# Patient Record
Sex: Female | Born: 1970 | Race: Black or African American | Hispanic: No | Marital: Single | Smoking: Current every day smoker
Health system: Southern US, Community
[De-identification: ages and names within clinical notes are randomized; demographics above are authoritative.]

## PROBLEM LIST (undated history)

## (undated) ENCOUNTER — Ambulatory Visit (HOSPITAL_COMMUNITY): Payer: Medicare Other

## (undated) DIAGNOSIS — D649 Anemia, unspecified: Secondary | ICD-10-CM

## (undated) DIAGNOSIS — M1990 Unspecified osteoarthritis, unspecified site: Secondary | ICD-10-CM

## (undated) HISTORY — PX: ABDOMINAL SURGERY: SHX537

## (undated) HISTORY — PX: LAPAROSCOPIC GASTRIC SLEEVE RESECTION: SHX5895

## (undated) HISTORY — PX: CHOLECYSTECTOMY: SHX55

---

## 2000-02-02 ENCOUNTER — Emergency Department (HOSPITAL_COMMUNITY): Admission: EM | Admit: 2000-02-02 | Discharge: 2000-02-02 | Payer: Self-pay | Admitting: Emergency Medicine

## 2000-02-22 ENCOUNTER — Emergency Department (HOSPITAL_COMMUNITY): Admission: EM | Admit: 2000-02-22 | Discharge: 2000-02-22 | Payer: Self-pay | Admitting: Emergency Medicine

## 2000-02-22 ENCOUNTER — Encounter: Payer: Self-pay | Admitting: Emergency Medicine

## 2000-07-24 ENCOUNTER — Emergency Department (HOSPITAL_COMMUNITY): Admission: EM | Admit: 2000-07-24 | Discharge: 2000-07-24 | Payer: Self-pay | Admitting: Emergency Medicine

## 2000-07-24 ENCOUNTER — Encounter: Payer: Self-pay | Admitting: Emergency Medicine

## 2000-09-04 ENCOUNTER — Emergency Department (HOSPITAL_COMMUNITY): Admission: EM | Admit: 2000-09-04 | Discharge: 2000-09-04 | Payer: Self-pay | Admitting: Emergency Medicine

## 2000-12-01 ENCOUNTER — Emergency Department (HOSPITAL_COMMUNITY): Admission: EM | Admit: 2000-12-01 | Discharge: 2000-12-01 | Payer: Self-pay | Admitting: Emergency Medicine

## 2000-12-08 ENCOUNTER — Ambulatory Visit (HOSPITAL_COMMUNITY): Admission: RE | Admit: 2000-12-08 | Discharge: 2000-12-08 | Payer: Self-pay | Admitting: Internal Medicine

## 2000-12-08 ENCOUNTER — Encounter: Admission: RE | Admit: 2000-12-08 | Discharge: 2000-12-08 | Payer: Self-pay | Admitting: Internal Medicine

## 2000-12-08 ENCOUNTER — Encounter: Payer: Self-pay | Admitting: Internal Medicine

## 2000-12-29 ENCOUNTER — Emergency Department (HOSPITAL_COMMUNITY): Admission: EM | Admit: 2000-12-29 | Discharge: 2000-12-29 | Payer: Self-pay | Admitting: Emergency Medicine

## 2001-01-27 ENCOUNTER — Emergency Department (HOSPITAL_COMMUNITY): Admission: EM | Admit: 2001-01-27 | Discharge: 2001-01-27 | Payer: Self-pay | Admitting: Emergency Medicine

## 2001-05-31 ENCOUNTER — Emergency Department (HOSPITAL_COMMUNITY): Admission: EM | Admit: 2001-05-31 | Discharge: 2001-06-01 | Payer: Self-pay | Admitting: Emergency Medicine

## 2001-06-04 ENCOUNTER — Emergency Department (HOSPITAL_COMMUNITY): Admission: EM | Admit: 2001-06-04 | Discharge: 2001-06-04 | Payer: Self-pay | Admitting: Emergency Medicine

## 2001-11-10 ENCOUNTER — Encounter: Payer: Self-pay | Admitting: Occupational Medicine

## 2001-11-10 ENCOUNTER — Encounter: Admission: RE | Admit: 2001-11-10 | Discharge: 2001-11-10 | Payer: Self-pay | Admitting: Occupational Medicine

## 2002-10-05 ENCOUNTER — Emergency Department (HOSPITAL_COMMUNITY): Admission: EM | Admit: 2002-10-05 | Discharge: 2002-10-05 | Payer: Self-pay | Admitting: Emergency Medicine

## 2003-06-30 ENCOUNTER — Emergency Department (HOSPITAL_COMMUNITY): Admission: EM | Admit: 2003-06-30 | Discharge: 2003-06-30 | Payer: Self-pay | Admitting: Emergency Medicine

## 2003-11-15 ENCOUNTER — Emergency Department (HOSPITAL_COMMUNITY): Admission: EM | Admit: 2003-11-15 | Discharge: 2003-11-15 | Payer: Self-pay | Admitting: *Deleted

## 2003-12-09 ENCOUNTER — Emergency Department (HOSPITAL_COMMUNITY): Admission: EM | Admit: 2003-12-09 | Discharge: 2003-12-09 | Payer: Self-pay | Admitting: Family Medicine

## 2004-06-18 ENCOUNTER — Emergency Department (HOSPITAL_COMMUNITY): Admission: EM | Admit: 2004-06-18 | Discharge: 2004-06-18 | Payer: Self-pay | Admitting: Family Medicine

## 2004-10-21 ENCOUNTER — Emergency Department (HOSPITAL_COMMUNITY): Admission: EM | Admit: 2004-10-21 | Discharge: 2004-10-21 | Payer: Self-pay | Admitting: Emergency Medicine

## 2004-12-11 ENCOUNTER — Emergency Department (HOSPITAL_COMMUNITY): Admission: EM | Admit: 2004-12-11 | Discharge: 2004-12-11 | Payer: Self-pay | Admitting: Emergency Medicine

## 2005-02-27 ENCOUNTER — Emergency Department (HOSPITAL_COMMUNITY): Admission: EM | Admit: 2005-02-27 | Discharge: 2005-02-27 | Payer: Self-pay | Admitting: Emergency Medicine

## 2005-06-11 ENCOUNTER — Emergency Department (HOSPITAL_COMMUNITY): Admission: EM | Admit: 2005-06-11 | Discharge: 2005-06-11 | Payer: Self-pay | Admitting: Family Medicine

## 2005-11-03 ENCOUNTER — Emergency Department (HOSPITAL_COMMUNITY): Admission: EM | Admit: 2005-11-03 | Discharge: 2005-11-03 | Payer: Self-pay | Admitting: Emergency Medicine

## 2005-11-04 ENCOUNTER — Ambulatory Visit: Payer: Self-pay | Admitting: *Deleted

## 2005-11-11 ENCOUNTER — Ambulatory Visit: Payer: Self-pay | Admitting: Internal Medicine

## 2007-06-17 ENCOUNTER — Ambulatory Visit: Payer: Self-pay | Admitting: Internal Medicine

## 2007-06-17 LAB — CONVERTED CEMR LAB
Basophils Relative: 0 % (ref 0–1)
Eosinophils Absolute: 0.2 10*3/uL (ref 0.0–0.7)
Ferritin: 36 ng/mL (ref 10–291)
Iron: 31 ug/dL — ABNORMAL LOW (ref 42–145)
MCHC: 31.8 g/dL (ref 30.0–36.0)
MCV: 85.3 fL (ref 78.0–100.0)
Neutrophils Relative %: 71 % (ref 43–77)
Platelets: 358 10*3/uL (ref 150–400)
RDW: 16.8 % — ABNORMAL HIGH (ref 11.5–15.5)
TSH: 2.732 microintl units/mL (ref 0.350–5.50)
UIBC: 308 ug/dL

## 2008-04-29 HISTORY — PX: LAPAROSCOPIC GASTRIC SLEEVE RESECTION: SHX5895

## 2010-10-04 ENCOUNTER — Inpatient Hospital Stay (INDEPENDENT_AMBULATORY_CARE_PROVIDER_SITE_OTHER)
Admission: RE | Admit: 2010-10-04 | Discharge: 2010-10-04 | Disposition: A | Discharge status: Home / Self Care | Payer: Medicare Other | Source: Ambulatory Visit | Attending: Emergency Medicine | Admitting: Emergency Medicine

## 2010-10-04 ENCOUNTER — Ambulatory Visit (INDEPENDENT_AMBULATORY_CARE_PROVIDER_SITE_OTHER): Payer: Medicare Other

## 2010-10-04 DIAGNOSIS — M129 Arthropathy, unspecified: Secondary | ICD-10-CM

## 2010-10-04 DIAGNOSIS — M79609 Pain in unspecified limb: Secondary | ICD-10-CM

## 2010-10-11 ENCOUNTER — Other Ambulatory Visit: Payer: Self-pay | Admitting: Orthopedic Surgery

## 2010-10-11 ENCOUNTER — Ambulatory Visit
Admission: RE | Admit: 2010-10-11 | Discharge: 2010-10-11 | Disposition: A | Discharge status: Home / Self Care | Payer: Medicare Other | Source: Ambulatory Visit | Attending: Orthopedic Surgery | Admitting: Orthopedic Surgery

## 2010-10-11 DIAGNOSIS — Q6689 Other  specified congenital deformities of feet: Secondary | ICD-10-CM

## 2012-12-03 ENCOUNTER — Emergency Department (INDEPENDENT_AMBULATORY_CARE_PROVIDER_SITE_OTHER)
Admission: EM | Admit: 2012-12-03 | Discharge: 2012-12-03 | Disposition: A | Discharge status: Home / Self Care | Payer: Medicare Other | Source: Home / Self Care | Attending: Emergency Medicine | Admitting: Emergency Medicine

## 2012-12-03 DIAGNOSIS — W57XXXA Bitten or stung by nonvenomous insect and other nonvenomous arthropods, initial encounter: Secondary | ICD-10-CM

## 2012-12-03 DIAGNOSIS — T148 Other injury of unspecified body region: Secondary | ICD-10-CM

## 2012-12-03 DIAGNOSIS — K5900 Constipation, unspecified: Secondary | ICD-10-CM

## 2012-12-03 DIAGNOSIS — Z5189 Encounter for other specified aftercare: Secondary | ICD-10-CM

## 2012-12-03 MED ORDER — TRIAMCINOLONE ACETONIDE 0.1 % EX CREA: TOPICAL_CREAM | Freq: Two times a day (BID) | CUTANEOUS | Status: DC

## 2012-12-03 MED ORDER — IBUPROFEN 800 MG PO TABS: 800.0000 mg | ORAL_TABLET | Freq: Three times a day (TID) | ORAL | Status: DC | PRN

## 2012-12-03 MED ORDER — POLYETHYLENE GLYCOL 3350 17 G PO PACK: 17.0000 g | PACK | Freq: Two times a day (BID) | ORAL | Status: DC

## 2012-12-03 NOTE — ED Provider Notes (Signed)
CSN: [id number]     Arrival date & time 12/03/12  1618 History     First MD Initiated Contact with Patient 12/03/12 1746     Chief Complaint  Patient presents with  . Wound Check   (Consider location/radiation/quality/duration/timing/severity/associated sxs/prior Treatment) HPI Comments: 42 year old female presents requesting to have her surgical wound check. She recently lost 200 pounds after getting weight loss surgery and she was having a large flap of skin removed one week ago. [place] She is here visiting her father who is in the hospital and she states that she was worried that it may be infected and she wanted to come get it checked out. She is not sure why she thinks it's infected. There is no increased pain, drainage, fever, chills. She has 4 drain bulbs in place in the clarity or amount of drainage has not increased. She is taking Keflex for wound prophylaxis  She also wants to know what she can do for constipation.  She is currently taking colace.  She states "did you know i had to dig my own poopy out my own butt last night?"   She also states she needs cream for bug bites.    Patient is a 42 y.o. female presenting with wound check.  Wound Check Pertinent negatives include no chest pain, no abdominal pain and no shortness of breath.    No past medical history on file. No past surgical history on file. No family history on file. History  Substance Use Topics  . Smoking status: Not on file  . Smokeless tobacco: Not on file  . Alcohol Use: Not on file   OB History   No data available     Review of Systems  Constitutional: Negative for fever and chills.  Eyes: Negative for visual disturbance.  Respiratory: Negative for cough and shortness of breath.   Cardiovascular: Negative for chest pain, palpitations and leg swelling.  Gastrointestinal: Negative for nausea, vomiting and abdominal pain.  Endocrine: Negative for polydipsia and polyuria.    Genitourinary: Negative for dysuria, urgency and frequency.  Musculoskeletal: Negative for myalgias and arthralgias.  Skin: Negative for rash.       See HPI   Neurological: Negative for dizziness, weakness and light-headedness.    Allergies  Shellfish allergy  Home Medications   Current Outpatient Rx  Name  Route  Sig  Dispense  Refill  . ibuprofen (ADVIL,MOTRIN) 800 MG tablet   Oral   Take 1 tablet (800 mg total) by mouth every 8 (eight) hours as needed for pain.   60 tablet   0   . polyethylene glycol (MIRALAX) packet   Oral   Take 17 g by mouth 2 (two) times daily.   30 each   0   . triamcinolone cream (KENALOG) 0.1 %   Topical   Apply topically 2 (two) times daily.   30 g   0    BP 125/68  Pulse 80  Temp(Src) 98 F (36.7 C) (Oral)  Resp 20  SpO2 99%  LMP 11/05/2012 Physical Exam  Constitutional: She is oriented to person, place, and time. She appears well-developed and well-nourished. No distress.  HENT:  Head: Normocephalic and atraumatic.  Cardiovascular: Normal rate and regular rhythm.  Exam reveals no gallop and no friction rub.   No murmur heard. Pulmonary/Chest: Effort normal and breath sounds normal. She has no wheezes. She has no rales. She exhibits no tenderness.  Abdominal: There is tenderness (diffuse, she has been  this way since the surgery ).    Musculoskeletal: Normal range of motion. She exhibits no tenderness.  Multiple large hanging skin flaps on extremities   Neurological: She is alert and oriented to person, place, and time.  Skin: Skin is warm and dry. No rash noted. She is not diaphoretic.  Psychiatric: She has a normal mood and affect.    ED Course   Procedures (including critical care time)  Labs Reviewed - No data to display No results found. 1. Visit for wound check   2. Constipation   3. Insect bite    Wound exposed, then re-redressed with silvadene dressing   MDM  No signs of wound infection.  Keep clean and dry.   Add miralax for constipation.  F/u as scheduled with surgeon.    Meds ordered this encounter  Medications  . polyethylene glycol (MIRALAX) packet    Sig: Take 17 g by mouth 2 (two) times daily.    Dispense:  30 each    Refill:  0  . triamcinolone cream (KENALOG) 0.1 %    Sig: Apply topically 2 (two) times daily.    Dispense:  30 g    Refill:  0  . ibuprofen (ADVIL,MOTRIN) 800 MG tablet    Sig: Take 1 tablet (800 mg total) by mouth every 8 (eight) hours as needed for pain.    Dispense:  60 tablet    Refill:  0     Graylon Good, PA-C 12/03/12 1827

## 2012-12-03 NOTE — ED Notes (Signed)
F/u on Wound check from stomach surgery which was 11/26/12.

## 2012-12-07 ENCOUNTER — Encounter (HOSPITAL_COMMUNITY): Payer: Self-pay

## 2012-12-07 ENCOUNTER — Emergency Department (INDEPENDENT_AMBULATORY_CARE_PROVIDER_SITE_OTHER)
Admission: EM | Admit: 2012-12-07 | Discharge: 2012-12-07 | Disposition: A | Discharge status: Home / Self Care | Payer: Medicare Other | Source: Home / Self Care | Attending: Family Medicine | Admitting: Family Medicine

## 2012-12-07 ENCOUNTER — Emergency Department (INDEPENDENT_AMBULATORY_CARE_PROVIDER_SITE_OTHER): Payer: Medicare Other

## 2012-12-07 DIAGNOSIS — K5900 Constipation, unspecified: Secondary | ICD-10-CM

## 2012-12-07 MED ORDER — POLYETHYLENE GLYCOL 3350 17 G PO PACK: 17.0000 g | PACK | Freq: Two times a day (BID) | ORAL | Status: DC

## 2012-12-07 NOTE — ED Notes (Signed)
Patient has temp relocated to [facility] area since her surgery ( in [facility]) and family member also having surgery. C/o she has not had a good BM past 3-4 days. On percocet for pain, and stool softener for her bowels. States she manually remover from her own rectum ,w aide of a glove , a "large amt of hard stool  " , and noted some rectal bleeding

## 2012-12-07 NOTE — ED Provider Notes (Signed)
CSN: [id number]     Arrival date & time 12/07/12  1448 History     First MD Initiated Contact with Patient 12/07/12 1509     Chief Complaint  Patient presents with  . Constipation   (Consider location/radiation/quality/duration/timing/severity/associated sxs/prior Treatment) HPI Comments: 42 year old female presents complaining of constipation. She was seen for this problem a few days ago and prescribed MiraLax but she did not take it. The constipation has not gotten better since then. She states that she has disimpacted herself this morning. She states that after this, a large amount of poop came out. She is concerned because she had a "noodle of blood" come out. She denies abdominal pain, further bleeding, NVD, dizziness. She has no history of GI bleeding. No history of hemorrhoids  Patient is a 42 y.o. female presenting with constipation.  Constipation Associated symptoms: no abdominal pain, no dysuria, no fever, no nausea and no vomiting     History reviewed. No pertinent past medical history. History reviewed. No pertinent past surgical history. History reviewed. No pertinent family history. History  Substance Use Topics  . Smoking status: Not on file  . Smokeless tobacco: Not on file  . Alcohol Use: Not on file   OB History   Grav Para Term Preterm Abortions TAB SAB Ect Mult Living                 Review of Systems  Constitutional: Negative for fever and chills.  Eyes: Negative for visual disturbance.  Respiratory: Negative for cough and shortness of breath.   Cardiovascular: Negative for chest pain, palpitations and leg swelling.  Gastrointestinal: Positive for constipation and anal bleeding. Negative for nausea, vomiting and abdominal pain.  Endocrine: Negative for polydipsia and polyuria.  Genitourinary: Negative for dysuria, urgency and frequency.  Musculoskeletal: Negative for myalgias and arthralgias.  Skin: Negative for rash.  Neurological: Negative for  dizziness, weakness and light-headedness.    Allergies  Shellfish allergy  Home Medications   Current Outpatient Rx  Name  Route  Sig  Dispense  Refill  . ibuprofen (ADVIL,MOTRIN) 800 MG tablet   Oral   Take 1 tablet (800 mg total) by mouth every 8 (eight) hours as needed for pain.   60 tablet   0   . polyethylene glycol (MIRALAX) packet   Oral   Take 17 g by mouth 2 (two) times daily.   30 each   0   . polyethylene glycol (MIRALAX) packet   Oral   Take 17 g by mouth 2 (two) times daily.   30 each   1   . triamcinolone cream (KENALOG) 0.1 %   Topical   Apply topically 2 (two) times daily.   30 g   0    BP 115/55  Pulse 59  Temp(Src) 98.9 F (37.2 C) (Oral)  Resp 15  SpO2 98%  LMP 11/13/2012 Physical Exam  Nursing note and vitals reviewed. Constitutional: She is oriented to person, place, and time. Vital signs are normal. She appears well-developed and well-nourished. No distress.  HENT:  Head: Atraumatic.  Eyes: EOM are normal. Pupils are equal, round, and reactive to light.  Abdominal: Soft. She exhibits no distension. There is no tenderness.  Wound from abdominoplasty is dressed, clean, no signs of infection.  Genitourinary: Rectum normal. Rectal exam shows no external hemorrhoid, no internal hemorrhoid, no fissure, no mass, no tenderness and anal tone normal. Guaiac negative stool.  Neurological: She is alert and oriented to person, place, and time. She  has normal strength.  Skin: Skin is warm and dry. She is not diaphoretic.  Psychiatric: She has a normal mood and affect. Her behavior is normal. Judgment normal.    ED Course   Procedures (including critical care time)  Labs Reviewed - No data to display Dg Abd 1 View  12/07/2012   *RADIOLOGY REPORT*  Clinical Data: Constipation.  ABDOMEN - 1 VIEW  Comparison: None  Findings: Gas is demonstrated within nondilated loops of large and small bowel in a nonobstructive pattern.  Surgical clips project over  the left upper quadrant.  Multiple surgical drains project over the lower abdomen, compatible with the patient's reported history of surgery on November 26, 2012.  Visualized lung bases are clear.  Regional skeleton is unremarkable.  IMPRESSION: Nonobstructive bowel gas pattern.   Original Report Authenticated By: Annia Belt, M.D   1. Constipation     MDM  no obstruction.  Stool is guaiac negative. Miralax, f/u PRN    Meds ordered this encounter  Medications  . polyethylene glycol (MIRALAX) packet    Sig: Take 17 g by mouth 2 (two) times daily.    Dispense:  30 each    Refill:  1     Graylon Good, PA-C 12/07/12 1820

## 2012-12-08 NOTE — ED Provider Notes (Signed)
Medical screening examination/treatment/procedure(s) were performed by non-physician practitioner and as supervising physician I was immediately available for consultation/collaboration.  Leslee Home, M.D.  Reuben Likes, MD 12/08/12 2123

## 2012-12-12 NOTE — ED Provider Notes (Signed)
Medical screening examination/treatment/procedure(s) were performed by resident physician or non-physician practitioner and as supervising physician I was immediately available for consultation/collaboration.   KINDL,JAMES DOUGLAS MD.   James D Kindl, MD 12/12/12 0948 

## 2012-12-14 ENCOUNTER — Emergency Department (INDEPENDENT_AMBULATORY_CARE_PROVIDER_SITE_OTHER)
Admission: EM | Admit: 2012-12-14 | Discharge: 2012-12-14 | Disposition: A | Discharge status: Home / Self Care | Payer: Medicare Other | Source: Home / Self Care | Attending: Emergency Medicine | Admitting: Emergency Medicine

## 2012-12-14 ENCOUNTER — Encounter (HOSPITAL_COMMUNITY): Payer: Self-pay | Admitting: Emergency Medicine

## 2012-12-14 DIAGNOSIS — T889XXS Complication of surgical and medical care, unspecified, sequela: Secondary | ICD-10-CM

## 2012-12-14 DIAGNOSIS — T8189XS Other complications of procedures, not elsewhere classified, sequela: Secondary | ICD-10-CM

## 2012-12-14 MED ORDER — HYDROCODONE-ACETAMINOPHEN 5-325 MG PO TABS
2.0000 | ORAL_TABLET | Freq: Once | ORAL | Status: AC
  Administered 2012-12-14: 2 via ORAL

## 2012-12-14 MED ORDER — HYDROCODONE-ACETAMINOPHEN 5-325 MG PO TABS: ORAL_TABLET | ORAL | Status: DC

## 2012-12-14 MED ORDER — HYDROCODONE-ACETAMINOPHEN 5-325 MG PO TABS
ORAL_TABLET | ORAL | Status: AC
  Filled 2012-12-14: qty 2

## 2012-12-14 NOTE — ED Notes (Signed)
C/o wound check

## 2012-12-14 NOTE — ED Provider Notes (Signed)
Chief Complaint:   Chief Complaint  Patient presents with  . Wound Check    History of Present Illness:   Anne Brewer is a 42 year old female who comes in today for wound care. She is from [place] and is visiting her sick father here in [place]. She plans to return back to [facility] tomorrow. She underwent an abdominal plasty on July 31. This was due to redundant skin after a massive weight loss. 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 She's not been back for followup with her surgeon, since she had to come here on an emergency basis. She was here about a week ago for wound care. The wound at that time was redressed. She has 3 drainage tubes in place which are draining serosanguineous fluid in. She denies any malodorous drainage, fever, chills, although the area is still very painful. The pain is rated as 7/10 in intensity. She denies any nausea or vomiting. Her bowel movements are now regular with use of MiraLax. She's been taking pain pills and this had been causing some constipation.  Review of Systems:  Other than noted above, the patient denies any of the following symptoms: Systemic:  No fever, chills, sweats, weight loss, or fatigue. ENT:  No nasal congestion, rhinorrhea, sore throat, swelling of lips, tongue or throat. Resp:  No cough, wheezing, or shortness of breath. Skin:  No rash, itching, nodules, or suspicious lesions.  PMFSH:  Past medical history, family history, social history, meds, and allergies were reviewed.   Physical Exam:   Vital signs:  BP 121/68  Pulse 60  Temp(Src) 98.6 F (37 C) (Oral)  Resp 20  SpO2 100%  LMP 11/13/2012 Gen:  Alert, oriented, in no distress. ENT:  Pharynx clear, no intraoral lesions, moist mucous membranes. Lungs:  Clear to auscultation. Skin:  The dressing on her abdomen was removed. The wound appears to be healing well without any evidence of infection.   Course in Urgent Care Center:    This was cleansed with saline, patted dry, antibiotic ointment was applied followed by Telfa dressings, gauze dressings, and then adhesive.  Assessment:  The encounter diagnosis was Delayed surgical wound healing, sequela.  This appears to be healing well but slowly. There is no evidence of infection. The drains are not draining any malodorous drainage. There is no erythema, pus, or induration. I think it should heal up well, but she was urged to followup with her surgeon in [place] as soon as she gets back there for wound care.  Plan:   1.  The following meds were prescribed:   Discharge Medication List as of 12/14/2012  4:17 PM    START taking these medications   Details  HYDROcodone-acetaminophen (NORCO/VICODIN) 5-325 MG per tablet 1 to 2 tabs every 4 to 6 hours as needed for pain., Print       2.  The patient was instructed in symptomatic care and handouts were given. 3.  The patient was told to return if becoming worse in any way, if no better in 3 or 4 days, and given some red flag symptoms such as fever or worsening pain that would indicate earlier return. 4.  Follow up with her surgeon in [place].     Reuben Likes, MD 12/14/12 [phone]

## 2013-04-12 ENCOUNTER — Encounter (HOSPITAL_COMMUNITY): Payer: Self-pay | Admitting: Emergency Medicine

## 2013-04-12 ENCOUNTER — Emergency Department (INDEPENDENT_AMBULATORY_CARE_PROVIDER_SITE_OTHER)
Admission: EM | Admit: 2013-04-12 | Discharge: 2013-04-12 | Disposition: A | Discharge status: Home / Self Care | Payer: Medicare Other | Source: Home / Self Care | Attending: Family Medicine | Admitting: Family Medicine

## 2013-04-12 DIAGNOSIS — G44209 Tension-type headache, unspecified, not intractable: Secondary | ICD-10-CM

## 2013-04-12 MED ORDER — DIPHENHYDRAMINE HCL 50 MG/ML IJ SOLN
INTRAMUSCULAR | Status: AC
  Filled 2013-04-12: qty 1

## 2013-04-12 MED ORDER — KETOROLAC TROMETHAMINE 30 MG/ML IJ SOLN
INTRAMUSCULAR | Status: AC
  Filled 2013-04-12: qty 1

## 2013-04-12 MED ORDER — TRAZODONE HCL 50 MG PO TABS: 50.0000 mg | ORAL_TABLET | Freq: Every day | ORAL | Status: DC

## 2013-04-12 MED ORDER — KETOROLAC TROMETHAMINE 30 MG/ML IJ SOLN: 30.0000 mg | Freq: Once | INTRAMUSCULAR | Status: DC

## 2013-04-12 MED ORDER — DIPHENHYDRAMINE HCL 50 MG/ML IJ SOLN: 50.0000 mg | Freq: Once | INTRAMUSCULAR | Status: DC

## 2013-04-12 NOTE — ED Notes (Signed)
This pt called in all waiting areas - no response from same.

## 2013-04-12 NOTE — ED Provider Notes (Signed)
CSN: [id number]     Arrival date & time 04/12/13  1816 History   First MD Initiated Contact with Patient 04/12/13 1928     Chief Complaint  Patient presents with  . Headache   (Consider location/radiation/quality/duration/timing/severity/associated sxs/prior Treatment) Patient is a 42 y.o. female presenting with headaches. The history is provided by the patient.  Headache Pain location:  L parietal Quality:  Sharp Radiates to:  Does not radiate Onset quality:  Gradual Duration:  4 days Progression:  Unchanged Chronicity:  New Similar to prior headaches: yes   Context: emotional stress and loud noise   Associated symptoms: no abdominal pain, no congestion, no dizziness, no fever, no focal weakness, no myalgias, no nausea, no neck pain, no numbness, no paresthesias, no photophobia, no sore throat and no vomiting     History reviewed. No pertinent past medical history. Past Surgical History  Procedure Laterality Date  . Abdominal surgery     History reviewed. No pertinent family history. History  Substance Use Topics  . Smoking status: Never Smoker   . Smokeless tobacco: Not on file  . Alcohol Use: Not on file   OB History   Grav Para Term Preterm Abortions TAB SAB Ect Mult Living                 Review of Systems  Constitutional: Negative.  Negative for fever.  HENT: Negative for congestion and sore throat.   Eyes: Negative for photophobia.  Gastrointestinal: Negative for nausea, vomiting and abdominal pain.  Musculoskeletal: Negative for myalgias and neck pain.  Neurological: Positive for headaches. Negative for dizziness, focal weakness, numbness and paresthesias.    Allergies  Shellfish allergy  Home Medications   Current Outpatient Rx  Name  Route  Sig  Dispense  Refill  . HYDROcodone-acetaminophen (NORCO/VICODIN) 5-325 MG per tablet      1 to 2 tabs every 4 to 6 hours as needed for pain.   30 tablet   0   . ibuprofen (ADVIL,MOTRIN) 800 MG tablet  Oral   Take 1 tablet (800 mg total) by mouth every 8 (eight) hours as needed for pain.   60 tablet   0   . polyethylene glycol (MIRALAX) packet   Oral   Take 17 g by mouth 2 (two) times daily.   30 each   0   . polyethylene glycol (MIRALAX) packet   Oral   Take 17 g by mouth 2 (two) times daily.   30 each   1   . traZODone (DESYREL) 50 MG tablet   Oral   Take 1 tablet (50 mg total) by mouth at bedtime.   10 tablet   0   . triamcinolone cream (KENALOG) 0.1 %   Topical   Apply topically 2 (two) times daily.   30 g   0    BP 123/60  Pulse 56  Temp(Src) 97.5 F (36.4 C) (Oral)  Resp 16  SpO2 100% Physical Exam  Nursing note and vitals reviewed. Constitutional: She is oriented to person, place, and time. She appears well-developed and well-nourished.  HENT:  Head: Normocephalic.  Right Ear: External ear normal.  Left Ear: External ear normal.  Mouth/Throat: Oropharynx is clear and moist.  Eyes: Conjunctivae and EOM are normal. Pupils are equal, round, and reactive to light.  Neck: Normal range of motion. Neck supple.  Abdominal: Soft. Bowel sounds are normal.  Musculoskeletal: Normal range of motion.  Lymphadenopathy:    She has no cervical adenopathy.  Neurological: She is alert and oriented to person, place, and time. She has normal reflexes.  Skin: Skin is warm and dry.    ED Course  Procedures (including critical care time) Labs Review Labs Reviewed - No data to display Imaging Review No results found.  EKG Interpretation    Date/Time:    Ventricular Rate:    PR Interval:    QRS Duration:   QT Interval:    QTC Calculation:   R Axis:     Text Interpretation:              MDM      Linna Hoff, MD 04/12/13 [phone]

## 2013-04-12 NOTE — ED Notes (Signed)
C/o HA; no relief w OTC medications

## 2013-05-24 ENCOUNTER — Encounter (HOSPITAL_COMMUNITY): Payer: Self-pay | Admitting: Emergency Medicine

## 2013-05-24 ENCOUNTER — Emergency Department (INDEPENDENT_AMBULATORY_CARE_PROVIDER_SITE_OTHER)
Admission: EM | Admit: 2013-05-24 | Discharge: 2013-05-24 | Disposition: A | Discharge status: Home / Self Care | Payer: Medicare Other | Source: Home / Self Care

## 2013-05-24 DIAGNOSIS — R51 Headache: Secondary | ICD-10-CM

## 2013-05-24 DIAGNOSIS — R519 Headache, unspecified: Secondary | ICD-10-CM

## 2013-05-24 MED ORDER — KETOROLAC TROMETHAMINE 30 MG/ML IJ SOLN: 30.0000 mg | Freq: Once | INTRAMUSCULAR | Status: DC

## 2013-05-24 MED ORDER — KETOROLAC TROMETHAMINE 30 MG/ML IJ SOLN
INTRAMUSCULAR | Status: AC
  Filled 2013-05-24: qty 1

## 2013-05-24 NOTE — Discharge Instructions (Signed)
We gave you toradol for your headache. Please go home and get some rest.   For your stress (which may have caused this), please go see one of the counselors I provided you with.   Please see list of providers as I would like for you to get a regular doctor to help you.   Follow up if headache does not resolve to level that you can handle with tylenol.

## 2013-05-24 NOTE — ED Notes (Signed)
C/o  Left sided headache for a month.  States under a lot of stress.  Pt describes the pain as throbbing.  No hx of migraines.  No relief with otc meds.

## 2013-05-24 NOTE — ED Provider Notes (Signed)
Anne Brewer is a 43 y.o. female who presents to Urgent Care today for headache  Patient complains of intermittent headaches for last 3 years. Usually controlled with tylenol though some require visit to urgent care and usually gets Toradol which helps. Left sided up to 8/10. Lasted for last 3 days. Sensitive to noise and light. Throbbing sensation. No other radiation. Stable during last 3 days.   Patient with chronic stressor related to someone's wife that she helped lose weight who happens to be a Engineer, structural. Patient requesting help dealing with this stress but does not want to take medicine. Previously prescribed trazodone which she is not taking.  Past medical history-previously morbidly obese but has lost 300 lbs, claims otherwise healthy, denies history of mental illness.  Surgical history-history of sleeve gastrectomy, abdominoplasty due to weight loss  History  Substance Use Topics  . Smoking status: Current Every Day Smoker -- 0.50 packs/day    Types: Cigarettes  . Smokeless tobacco: Not on file  . Alcohol Use: No   ROS as above. Denies nausea/vomiting/fever/chills/fatigue/overall sick feelings. No weakness in extremity. No aura.   Medications reviewed. Current Facility-Administered Medications  Medication Dose Route Frequency Provider Last Rate Last Dose  . ketorolac (TORADOL) 30 MG/ML injection 30 mg  30 mg Intravenous Once Marin Olp, MD       Current Outpatient Prescriptions  Medication Sig Dispense Refill  . traZODone (DESYREL) 50 MG tablet Take 1 tablet (50 mg total) by mouth at bedtime.  10 tablet  0  not taking trazodone  Exam:  BP 112/69  Pulse 68  Temp(Src) 98.3 F (36.8 C) (Oral)  Resp 18  SpO2 100%  LMP 04/29/2013 Gen: Well NAD, very talkative, fixated on stress from female cop as mentioned in HPI HEENT: EOMI,  MMM Lungs: Normal work of breathing. CTABL Heart: RRR no MRG Abd: NABS, Soft. NT, ND Exts: Non edematous BL  LE, warm and well  perfused. Excess arm fat (evidence of extreme weight loss) Neuro: CN II-XII intact, sensation and reflexes normal throughout, 5/5 muscle strength in bilateral upper and lower extremities. Normal finger to nose. Normal rapid alternating movements.   Assessment and Plan: 43 y.o. female with tension headache (? Migraines) Only had headaches for last 3 years revolving around stressful life situation. Normal neuro exam today. Think these are more likely tension headaches though have features of migraines (P-ounding, U-nilateral, O-ver four hours) though no nausea and not technically disabling as patient well appearing and talkative today. Gave Toradol in office today with relief of symptoms. Advised patient to get a good nights rest. Additionally, provided handout for mental health providers through [facility] and encouraged patient to follow up for stress management. She seemed reasonably interested in this idea. Given chronic stress, think this may be more helpful than prophylactic medicine for headache. Also encouraged patient to establish at community [facility] wellness center.     Marin Olp, MD 05/24/13 2157

## 2013-05-25 NOTE — ED Provider Notes (Signed)
Medical screening examination/treatment/procedure(s) were performed by resident physician or non-physician practitioner and as supervising physician I was immediately available for consultation/collaboration.   Chenel Wernli DOUGLAS MD.   Ariele Vidrio D Hakop Humbarger, MD 05/25/13 1021 

## 2013-10-27 ENCOUNTER — Encounter (HOSPITAL_COMMUNITY): Payer: Self-pay | Admitting: Emergency Medicine

## 2013-10-27 ENCOUNTER — Emergency Department (INDEPENDENT_AMBULATORY_CARE_PROVIDER_SITE_OTHER)
Admission: EM | Admit: 2013-10-27 | Discharge: 2013-10-27 | Disposition: A | Discharge status: Home / Self Care | Payer: Medicare Other | Source: Home / Self Care | Attending: Emergency Medicine | Admitting: Emergency Medicine

## 2013-10-27 DIAGNOSIS — G44229 Chronic tension-type headache, not intractable: Secondary | ICD-10-CM

## 2013-10-27 MED ORDER — KETOROLAC TROMETHAMINE 60 MG/2ML IM SOLN
60.0000 mg | Freq: Once | INTRAMUSCULAR | Status: AC
  Administered 2013-10-27: 60 mg via INTRAMUSCULAR

## 2013-10-27 MED ORDER — CYCLOBENZAPRINE HCL 5 MG PO TABS: 5.0000 mg | ORAL_TABLET | Freq: Three times a day (TID) | ORAL | Status: DC | PRN

## 2013-10-27 MED ORDER — IBUPROFEN 800 MG PO TABS: 800.0000 mg | ORAL_TABLET | Freq: Three times a day (TID) | ORAL | Status: DC

## 2013-10-27 MED ORDER — METOCLOPRAMIDE HCL 5 MG/ML IJ SOLN
10.0000 mg | Freq: Once | INTRAMUSCULAR | Status: AC
  Administered 2013-10-27: 10 mg via INTRAMUSCULAR

## 2013-10-27 MED ORDER — METOCLOPRAMIDE HCL 5 MG/ML IJ SOLN
INTRAMUSCULAR | Status: AC
  Filled 2013-10-27: qty 2

## 2013-10-27 MED ORDER — KETOROLAC TROMETHAMINE 60 MG/2ML IM SOLN
INTRAMUSCULAR | Status: AC
  Filled 2013-10-27: qty 2
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
## 2013-10-27 NOTE — ED Notes (Signed)
Multiple social issues, c/o HA and body aches

## 2013-10-27 NOTE — Discharge Instructions (Signed)

## 2014-03-08 ENCOUNTER — Other Ambulatory Visit: Payer: Self-pay | Admitting: Specialist

## 2014-03-08 ENCOUNTER — Other Ambulatory Visit: Payer: Self-pay | Admitting: Family Medicine

## 2014-03-08 DIAGNOSIS — N6310 Unspecified lump in the right breast, unspecified quadrant: Secondary | ICD-10-CM

## 2014-03-18 ENCOUNTER — Other Ambulatory Visit: Payer: Medicare Other

## 2014-03-22 ENCOUNTER — Ambulatory Visit
Admission: RE | Admit: 2014-03-22 | Discharge: 2014-03-22 | Disposition: A | Discharge status: Home / Self Care | Payer: Medicare Other | Source: Ambulatory Visit | Attending: Specialist | Admitting: Specialist

## 2014-03-22 ENCOUNTER — Other Ambulatory Visit: Payer: Self-pay | Admitting: Specialist

## 2014-03-22 DIAGNOSIS — N6320 Unspecified lump in the left breast, unspecified quadrant: Secondary | ICD-10-CM

## 2014-03-22 DIAGNOSIS — N6310 Unspecified lump in the right breast, unspecified quadrant: Secondary | ICD-10-CM

## 2014-05-27 ENCOUNTER — Emergency Department (INDEPENDENT_AMBULATORY_CARE_PROVIDER_SITE_OTHER)
Admission: EM | Admit: 2014-05-27 | Discharge: 2014-05-27 | Disposition: A | Discharge status: Home / Self Care | Payer: Medicare Other | Source: Home / Self Care | Attending: Family Medicine | Admitting: Family Medicine

## 2014-05-27 ENCOUNTER — Encounter (HOSPITAL_COMMUNITY): Payer: Self-pay | Admitting: Emergency Medicine

## 2014-05-27 DIAGNOSIS — M791 Myalgia, unspecified site: Secondary | ICD-10-CM

## 2014-05-27 DIAGNOSIS — F4329 Adjustment disorder with other symptoms: Secondary | ICD-10-CM

## 2014-05-27 DIAGNOSIS — R5383 Other fatigue: Secondary | ICD-10-CM

## 2014-05-27 DIAGNOSIS — N920 Excessive and frequent menstruation with regular cycle: Secondary | ICD-10-CM

## 2014-05-27 DIAGNOSIS — G44219 Episodic tension-type headache, not intractable: Secondary | ICD-10-CM

## 2014-05-27 LAB — POCT URINALYSIS DIP (DEVICE)
Bilirubin Urine: NEGATIVE
Glucose, UA: NEGATIVE mg/dL
HGB URINE DIPSTICK: NEGATIVE
Ketones, ur: NEGATIVE mg/dL
Leukocytes, UA: NEGATIVE
Nitrite: NEGATIVE
Protein, ur: NEGATIVE mg/dL
SPECIFIC GRAVITY, URINE: 1.02 (ref 1.005–1.030)
Urobilinogen, UA: 0.2 mg/dL (ref 0.0–1.0)
pH: 7 (ref 5.0–8.0)

## 2014-05-27 LAB — POCT I-STAT, CHEM 8
BUN: 9 mg/dL (ref 6–23)
CALCIUM ION: 1.18 mmol/L (ref 1.12–1.23)
CHLORIDE: 104 mmol/L (ref 96–112)
CREATININE: 0.9 mg/dL (ref 0.50–1.10)
Glucose, Bld: 97 mg/dL (ref 70–99)
HCT: 34 % — ABNORMAL LOW (ref 36.0–46.0)
Hemoglobin: 11.6 g/dL — ABNORMAL LOW (ref 12.0–15.0)
Potassium: 4.3 mmol/L (ref 3.5–5.1)
Sodium: 142 mmol/L (ref 135–145)
TCO2: 22 mmol/L (ref 0–100)

## 2014-05-27 MED ORDER — FERROUS SULFATE 324 (65 FE) MG PO TBEC
DELAYED_RELEASE_TABLET | ORAL | Status: DC
Start: 2014-05-27 — End: 2014-08-13

## 2014-05-27 MED ORDER — IBUPROFEN 800 MG PO TABS
800.0000 mg | ORAL_TABLET | Freq: Once | ORAL | Status: AC
  Administered 2014-05-27: 800 mg via ORAL

## 2014-05-27 MED ORDER — IBUPROFEN 800 MG PO TABS
ORAL_TABLET | ORAL | Status: AC
  Filled 2014-05-27: qty 1

## 2014-05-27 NOTE — ED Notes (Signed)
Patient cannot void at this time 
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
## 2014-05-27 NOTE — ED Notes (Signed)
Pt states that she feels weak from having her menstrual. Pt is in no acute distress at this time.

## 2014-05-27 NOTE — Discharge Instructions (Signed)
Abnormal Uterine Bleeding Abnormal uterine bleeding means bleeding from the vagina that is not your normal menstrual period. This can be:  Bleeding or spotting between periods.  Bleeding after sex (sexual intercourse).  Bleeding that is heavier or more than normal.  Periods that last longer than usual.  Bleeding after menopause. There are many problems that may cause this. Treatment will depend on the cause of the bleeding. Any kind of bleeding that is not normal should be reviewed by your doctor.  HOME CARE Watch your condition for any changes. These actions may lessen any discomfort you are having:  Do not use tampons or douches as told by your doctor.  Change your pads often. You should get regular pelvic exams and Pap tests. Keep all appointments for tests as told by your doctor. GET HELP IF:  You are bleeding for more than 1 week.  You feel dizzy at times. GET HELP RIGHT AWAY IF:   You pass out.  You have to change pads every 15 to 30 minutes.  You have belly pain.  You have a fever.  You become sweaty or weak.  You are passing large blood clots from the vagina.  You feel sick to your stomach (nauseous) and throw up (vomit). MAKE SURE YOU:  Understand these instructions.  Will watch your condition.  Will get help right away if you are not doing well or get worse. Document Released: 02/10/2009 Document Revised: 04/20/2013 Document Reviewed: 11/12/2012 [facility] Patient Information 2015 [place], [place]. This information is not intended to replace advice given to you by your health care provider. Make sure you discuss any questions you have with your health care provider.  Fatigue Fatigue is a feeling of tiredness, lack of energy, lack of motivation, or feeling tired all the time. Having enough rest, good nutrition, and reducing stress will normally reduce fatigue. Consult your caregiver if it persists. The nature of your fatigue will help your caregiver to find  out its cause. The treatment is based on the cause.  CAUSES  There are many causes for fatigue. Most of the time, fatigue can be traced to one or more of your habits or routines. Most causes fit into one or more of three general areas. They are: Lifestyle problems  Sleep disturbances.  Overwork.  Physical exertion.  Unhealthy habits.  Poor eating habits or eating disorders.  Alcohol and/or drug use .  Lack of proper nutrition (malnutrition). Psychological problems  Stress and/or anxiety problems.  Depression.  Grief.  Boredom. Medical Problems or Conditions  Anemia.  Pregnancy.  Thyroid gland problems.  Recovery from major surgery.  Continuous pain.  Emphysema or asthma that is not well controlled  Allergic conditions.  Diabetes.  Infections (such as mononucleosis).  Obesity.  Sleep disorders, such as sleep apnea.  Heart failure or other heart-related problems.  Cancer.  Kidney disease.  Liver disease.  Effects of certain medicines such as antihistamines, cough and cold remedies, prescription pain medicines, heart and blood pressure medicines, drugs used for treatment of cancer, and some antidepressants. SYMPTOMS  The symptoms of fatigue include:   Lack of energy.  Lack of drive (motivation).  Drowsiness.  Feeling of indifference to the surroundings. DIAGNOSIS  The details of how you feel help guide your caregiver in finding out what is causing the fatigue. You will be asked about your present and past health condition. It is important to review all medicines that you take, including prescription and non-prescription items. A thorough exam will be done.  You will be questioned about your feelings, habits, and normal lifestyle. Your caregiver may suggest blood tests, urine tests, or other tests to look for common medical causes of fatigue.  TREATMENT  Fatigue is treated by correcting the underlying cause. For example, if you have continuous pain  or depression, treating these causes will improve how you feel. Similarly, adjusting the dose of certain medicines will help in reducing fatigue.  HOME CARE INSTRUCTIONS   Try to get the required amount of good sleep every night.  Eat a healthy and nutritious diet, and drink enough water throughout the day.  Practice ways of relaxing (including yoga or meditation).  Exercise regularly.  Make plans to change situations that cause stress. Act on those plans so that stresses decrease over time. Keep your work and personal routine reasonable.  Avoid street drugs and minimize use of alcohol.  Start taking a daily multivitamin after consulting your caregiver. SEEK MEDICAL CARE IF:   You have persistent tiredness, which cannot be accounted for.  You have fever.  You have unintentional weight loss.  You have headaches.  You have disturbed sleep throughout the night.  You are feeling sad.  You have constipation.  You have dry skin.  You have gained weight.  You are taking any new or different medicines that you suspect are causing fatigue.  You are unable to sleep at night.  You develop any unusual swelling of your legs or other parts of your body. SEEK IMMEDIATE MEDICAL CARE IF:   You are feeling confused.  Your vision is blurred.  You feel faint or pass out.  You develop severe headache.  You develop severe abdominal, pelvic, or back pain.  You develop chest pain, shortness of breath, or an irregular or fast heartbeat.  You are unable to pass a normal amount of urine.  You develop abnormal bleeding such as bleeding from the rectum or you vomit blood.  You have thoughts about harming yourself or committing suicide.  You are worried that you might harm someone else. MAKE SURE YOU:   Understand these instructions.  Will watch your condition.  Will get help right away if you are not doing well or get worse. Document Released: 02/10/2007 Document Revised:  07/08/2011 Document Reviewed: 08/17/2013 [facility] Patient Information 2015 [place], [place]. This information is not intended to replace advice given to you by your health care provider. Make sure you discuss any questions you have with your health care provider.  Headaches, Frequently Asked Questions MIGRAINE HEADACHES Q: What is migraine? What causes it? How can I treat it? A: Generally, migraine headaches begin as a dull ache. Then they develop into a constant, throbbing, and pulsating pain. You may experience pain at the temples. You may experience pain at the front or back of one or both sides of the head. The pain is usually accompanied by a combination of:  Nausea.  Vomiting.  Sensitivity to light and noise. Some people (about 15%) experience an aura (see below) before an attack. The cause of migraine is believed to be chemical reactions in the brain. Treatment for migraine may include over-the-counter or prescription medications. It may also include self-help techniques. These include relaxation training and biofeedback.  Q: What is an aura? A: About 15% of people with migraine get an "aura". This is a sign of neurological symptoms that occur before a migraine headache. You may see wavy or jagged lines, dots, or flashing lights. You might experience tunnel vision or blind spots in one  or both eyes. The aura can include visual or auditory hallucinations (something imagined). It may include disruptions in smell (such as strange odors), taste or touch. Other symptoms include:  Numbness.  A "pins and needles" sensation.  Difficulty in recalling or speaking the correct word. These neurological events may last as long as 60 minutes. These symptoms will fade as the headache begins. Q: What is a trigger? A: Certain physical or environmental factors can lead to or "trigger" a migraine. These include:  Foods.  Hormonal changes.  Weather.  Stress. It is important to remember that  triggers are different for everyone. To help prevent migraine attacks, you need to figure out which triggers affect you. Keep a headache diary. This is a good way to track triggers. The diary will help you talk to your healthcare professional about your condition. Q: Does weather affect migraines? A: Bright sunshine, hot, humid conditions, and drastic changes in barometric pressure may lead to, or "trigger," a migraine attack in some people. But studies have shown that weather does not act as a trigger for everyone with migraines. Q: What is the link between migraine and hormones? A: Hormones start and regulate many of your body's functions. Hormones keep your body in balance within a constantly changing environment. The levels of hormones in your body are unbalanced at times. Examples are during menstruation, pregnancy, or menopause. That can lead to a migraine attack. In fact, about three quarters of all women with migraine report that their attacks are related to the menstrual cycle.  Q: Is there an increased risk of stroke for migraine sufferers? A: The likelihood of a migraine attack causing a stroke is very remote. That is not to say that migraine sufferers cannot have a stroke associated with their migraines. In persons under age 97, the most common associated factor for stroke is migraine headache. But over the course of a person's normal life span, the occurrence of migraine headache may actually be associated with a reduced risk of dying from cerebrovascular disease due to stroke.  Q: What are acute medications for migraine? A: Acute medications are used to treat the pain of the headache after it has started. Examples over-the-counter medications, NSAIDs, ergots, and triptans.  Q: What are the triptans? A: Triptans are the newest class of abortive medications. They are specifically targeted to treat migraine. Triptans are vasoconstrictors. They moderate some chemical reactions in the brain. The  triptans work on receptors in your brain. Triptans help to restore the balance of a neurotransmitter called serotonin. Fluctuations in levels of serotonin are thought to be a main cause of migraine.  Q: Are over-the-counter medications for migraine effective? A: Over-the-counter, or "OTC," medications may be effective in relieving mild to moderate pain and associated symptoms of migraine. But you should see your caregiver before beginning any treatment regimen for migraine.  Q: What are preventive medications for migraine? A: Preventive medications for migraine are sometimes referred to as "prophylactic" treatments. They are used to reduce the frequency, severity, and length of migraine attacks. Examples of preventive medications include antiepileptic medications, antidepressants, beta-blockers, calcium channel blockers, and NSAIDs (nonsteroidal anti-inflammatory drugs). Q: Why are anticonvulsants used to treat migraine? A: During the past few years, there has been an increased interest in antiepileptic drugs for the prevention of migraine. They are sometimes referred to as "anticonvulsants". Both epilepsy and migraine may be caused by similar reactions in the brain.  Q: Why are antidepressants used to treat migraine? A: Antidepressants are typically used  to treat people with depression. They may reduce migraine frequency by regulating chemical levels, such as serotonin, in the brain.  Q: What alternative therapies are used to treat migraine? A: The term "alternative therapies" is often used to describe treatments considered outside the scope of conventional Western medicine. Examples of alternative therapy include acupuncture, acupressure, and yoga. Another common alternative treatment is herbal therapy. Some herbs are believed to relieve headache pain. Always discuss alternative therapies with your caregiver before proceeding. Some herbal products contain arsenic and other toxins. TENSION HEADACHES Q:  What is a tension-type headache? What causes it? How can I treat it? A: Tension-type headaches occur randomly. They are often the result of temporary stress, anxiety, fatigue, or anger. Symptoms include soreness in your temples, a tightening band-like sensation around your head (a "vice-like" ache). Symptoms can also include a pulling feeling, pressure sensations, and contracting head and neck muscles. The headache begins in your forehead, temples, or the back of your head and neck. Treatment for tension-type headache may include over-the-counter or prescription medications. Treatment may also include self-help techniques such as relaxation training and biofeedback. CLUSTER HEADACHES Q: What is a cluster headache? What causes it? How can I treat it? A: Cluster headache gets its name because the attacks come in groups. The pain arrives with little, if any, warning. It is usually on one side of the head. A tearing or bloodshot eye and a runny nose on the same side of the headache may also accompany the pain. Cluster headaches are believed to be caused by chemical reactions in the brain. They have been described as the most severe and intense of any headache type. Treatment for cluster headache includes prescription medication and oxygen. SINUS HEADACHES Q: What is a sinus headache? What causes it? How can I treat it? A: When a cavity in the bones of the face and skull (a sinus) becomes inflamed, the inflammation will cause localized pain. This condition is usually the result of an allergic reaction, a tumor, or an infection. If your headache is caused by a sinus blockage, such as an infection, you will probably have a fever. An x-ray will confirm a sinus blockage. Your caregiver's treatment might include antibiotics for the infection, as well as antihistamines or decongestants.  REBOUND HEADACHES Q: What is a rebound headache? What causes it? How can I treat it? A: A pattern of taking acute headache  medications too often can lead to a condition known as "rebound headache." A pattern of taking too much headache medication includes taking it more than 2 days per week or in excessive amounts. That means more than the label or a caregiver advises. With rebound headaches, your medications not only stop relieving pain, they actually begin to cause headaches. Doctors treat rebound headache by tapering the medication that is being overused. Sometimes your caregiver will gradually substitute a different type of treatment or medication. Stopping may be a challenge. Regularly overusing a medication increases the potential for serious side effects. Consult a caregiver if you regularly use headache medications more than 2 days per week or more than the label advises. ADDITIONAL QUESTIONS AND ANSWERS Q: What is biofeedback? A: Biofeedback is a self-help treatment. Biofeedback uses special equipment to monitor your body's involuntary physical responses. Biofeedback monitors:  Breathing.  Pulse.  Heart rate.  Temperature.  Muscle tension.  Brain activity. Biofeedback helps you refine and perfect your relaxation exercises. You learn to control the physical responses that are related to stress. Once the technique  has been mastered, you do not need the equipment any more. Q: Are headaches hereditary? A: Four out of five (80%) of people that suffer report a family history of migraine. Scientists are not sure if this is genetic or a family predisposition. Despite the uncertainty, a child has a 50% chance of having migraine if one parent suffers. The child has a 75% chance if both parents suffer.  Q: Can children get headaches? A: By the time they reach high school, most young people have experienced some type of headache. Many safe and effective approaches or medications can prevent a headache from occurring or stop it after it has begun.  Q: What type of doctor should I see to diagnose and treat my  headache? A: Start with your primary caregiver. Discuss his or her experience and approach to headaches. Discuss methods of classification, diagnosis, and treatment. Your caregiver may decide to recommend you to a headache specialist, depending upon your symptoms or other physical conditions. Having diabetes, allergies, etc., may require a more comprehensive and inclusive approach to your headache. The National Headache Foundation will provide, upon request, a list of [facility] physician members in your state. Document Released: 07/06/2003 Document Revised: 07/08/2011 Document Reviewed: 12/14/2007 [facility] Patient Information 2015 [place], [place]. This information is not intended to replace advice given to you by your health care provider. Make sure you discuss any questions you have with your health care provider.  Menorrhagia Menorrhagia is when your menstrual periods are heavy or last longer than usual.  HOME CARE  Only take medicine as told by your doctor.  Take any iron pills as told by your doctor. Heavy bleeding may cause low levels of iron in your body.  Do not take aspirin 1 week before or during your period. Aspirin can make the bleeding worse.  Lie down for a while if you change your tampon or pad more than once in 2 hours. This may help lessen the bleeding.  Eat a healthy diet and foods with iron. These foods include leafy green vegetables, meat, liver, eggs, and whole grain breads and cereals.  Do not try to lose weight. Wait until the heavy bleeding has stopped and your iron level is normal. GET HELP IF:  You soak through a pad or tampon every 1 or 2 hours, and this happens every time you have a period.  You need to use pads and tampons at the same time because you are bleeding so much.  You need to change your pad or tampon during the night.  You have a period that lasts for more than 8 days.  You pass clots bigger than 1 inch (2.5 cm) wide.  You have irregular periods that  happen more or less often than once a month.  You feel dizzy or pass out (faint).  You feel very weak or tired.  You feel short of breath or feel your heart is beating too fast when you exercise.  You feel sick to your stomach (nausea) and you throw up (vomit) while you are taking your medicine.   You have watery poop (diarrhea) while you are taking your medicine.  You have any problems that may be related to the medicine you are taking.  GET HELP RIGHT AWAY IF:  You soak through 4 or more pads or tampons in 2 hours.  You have any bleeding while you are pregnant. MAKE SURE YOU:   Understand these instructions.  Will watch your condition.  Will get help right away  if you are not doing well or get worse. Document Released: 01/23/2008 Document Revised: 12/16/2012 Document Reviewed: 10/15/2012 [facility] Patient Information 2015 [place], [place]. This information is not intended to replace advice given to you by your health care provider. Make sure you discuss any questions you have with your health care provider.  Weakness Weakness is a lack of strength. You may feel weak all over your body or just in one part of your body. Weakness can be serious. In some cases, you may need more medical tests. HOME [place] a well-balanced diet.  Try to exercise every day.  Only take medicines as told by your doctor. GET HELP RIGHT AWAY IF:   You cannot do your normal daily activities.  You cannot walk up and down stairs, or you feel very tired when you do so.  You have shortness of breath or chest pain.  You have trouble moving parts of your body.  You have weakness in only one body part or on only one side of the body.  You have a fever.  You have trouble speaking or swallowing.  You cannot control when you pee (urinate) or poop (bowel movement).  You have black or bloody throw up (vomit) or poop.  Your weakness gets worse or spreads to other body parts.  You have  new aches or pains. MAKE SURE YOU:   Understand these instructions.  Will watch your condition.  Will get help right away if you are not doing well or get worse. Document Released: 03/28/2008 Document Revised: 10/15/2011 Document Reviewed: 06/14/2011 [facility] Patient Information 2015 [place], [place]. This information is not intended to replace advice given to you by your health care provider. Make sure you discuss any questions you have with your health care provider.

## 2014-08-03 ENCOUNTER — Ambulatory Visit (INDEPENDENT_AMBULATORY_CARE_PROVIDER_SITE_OTHER): Payer: Medicare Other

## 2014-08-03 ENCOUNTER — Ambulatory Visit: Payer: Medicare Other

## 2014-08-03 ENCOUNTER — Encounter: Payer: Self-pay | Admitting: Podiatry

## 2014-08-03 ENCOUNTER — Ambulatory Visit (INDEPENDENT_AMBULATORY_CARE_PROVIDER_SITE_OTHER): Payer: Medicare Other | Admitting: Podiatry

## 2014-08-03 VITALS — BP 94/53 | HR 59 | Resp 12 | Ht 68.0 in | Wt 203.0 lb

## 2014-08-03 DIAGNOSIS — M19072 Primary osteoarthritis, left ankle and foot: Secondary | ICD-10-CM

## 2014-08-03 DIAGNOSIS — M79672 Pain in left foot: Secondary | ICD-10-CM | POA: Diagnosis not present

## 2014-08-03 NOTE — Progress Notes (Signed)
   Subjective:    Patient ID: Anne Brewer, female    DOB: [birth year], 44 y.o.   MRN: [id number]  HPI 44 year old female presents the office they for second opinion of left ankle pain. She states that she previously was seen orthopedics. She states that she did not like the restrictions of the put on her in regards to work and her daily activities. 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 She states that she started working out quite often going to the gym. After increased activity she was having increased pain to the right foot and ankle. An MRI was performed at that time revealed significant stress fractures. The MRI was performed back in December for which she brought a copy into the office today. She states that over the last couple months she has had decreased pain and she has been ambulating. She doesn't that after she is an [place] he continues to have pain to the ankle. She denies any recent swelling or redness. Denies any recent injury or trauma. No other complaints at this time.   Review of Systems  Constitutional: Positive for fatigue.  All other systems reviewed and are negative.      Objective:   Physical Exam AAO 3, NAD DP/PT pulses palpable, CRT less than 3 seconds Protective sensation intact with Simms weinstein monofilament, vibratory sensation intact, Achilles tendon reflex intact. There is tenderness palpation along the medial aspect of the left rear foot on the toe navicular joint. There is also significant decrease in subtalar joint range of motion there is pain with range of motion. Ankle joint range of motion is intact and without any significant pain. There is no areas of pinpoint bony tenderness and there is no pain with vibratory sensation to the foot and ankle bilaterally. There is no overlying edema, erythema, increase in warmth. There is a decrease in medial arch height upon weightbearing. Equinus is present. No other  areas of tenderness to bilateral lower extremities. MMT 5/5, ROM WNL No open lesions or pre-ulcerative lesions identified bilaterally. No pain with calf compression, sewing, warmth, erythema.     Assessment & Plan:  44 year old female with left foot and ankle pain. -X-rays were obtained and reviewed with the patient. -Treatment options discussed include alternatives, risks, complications. -I would that the patient didn't desire surgical intervention in the future she  most likely need a triple arthrodesis. However given current living situation I do not do that she be able to handle the surgery and post-operative course.  -I discussed with her and [place] brace or other bracing options. A prescription for this was given to her to go to [employer] and have this made.  -Follow-up after the brace arrives. In the meantime encouraged to call the office with any questions, concerns, change in symptoms.

## 2014-08-11 ENCOUNTER — Encounter: Payer: Self-pay | Admitting: Podiatry

## 2014-08-13 ENCOUNTER — Emergency Department (INDEPENDENT_AMBULATORY_CARE_PROVIDER_SITE_OTHER)
Admission: EM | Admit: 2014-08-13 | Discharge: 2014-08-13 | Disposition: A | Discharge status: Home / Self Care | Payer: Medicare Other | Source: Home / Self Care | Attending: Emergency Medicine | Admitting: Emergency Medicine

## 2014-08-13 ENCOUNTER — Encounter (HOSPITAL_COMMUNITY): Payer: Self-pay

## 2014-08-13 DIAGNOSIS — T162XXA Foreign body in left ear, initial encounter: Secondary | ICD-10-CM

## 2014-08-13 MED ORDER — DICLOFENAC POTASSIUM 50 MG PO TABS: 50.0000 mg | ORAL_TABLET | Freq: Three times a day (TID) | ORAL | Status: DC

## 2014-08-13 MED ORDER — IBUPROFEN 800 MG PO TABS
ORAL_TABLET | ORAL | Status: AC
  Filled 2014-08-13: qty 1

## 2014-08-13 MED ORDER — IBUPROFEN 800 MG PO TABS
800.0000 mg | ORAL_TABLET | Freq: Once | ORAL | Status: AC
  Administered 2014-08-13: 800 mg via ORAL

## 2014-08-13 NOTE — ED Provider Notes (Signed)
CSN: [id number]     Arrival date & time 08/13/14  1357 History   First MD Initiated Contact with Patient 08/13/14 1538     Chief Complaint  Patient presents with  . Ear Problem   (Consider location/radiation/quality/duration/timing/severity/associated sxs/prior Treatment) HPI Comments: 44 year old female states she was trying to protect her hearing and in doing so she placed a fragment of formed wax in the left EAC. She says she cannot get it out and it is stuck. She is complaining of decreased hearing in the left ear.   History reviewed. No pertinent past medical history. Past Surgical History  Procedure Laterality Date  . Abdominal surgery    . Cesarean section    . Cholecystectomy     History reviewed. No pertinent family history. History  Substance Use Topics  . Smoking status: Current Every Day Smoker -- 0.30 packs/day    Types: Cigarettes  . Smokeless tobacco: Not on file  . Alcohol Use: No   OB History    No data available     Review of Systems  Constitutional: Negative for activity change.  HENT: Negative for congestion, ear discharge, ear pain, postnasal drip and rhinorrhea.   All other systems reviewed and are negative.   Allergies  Shellfish allergy  Home Medications   Prior to Admission medications   Medication Sig Start Date End Date Taking? Authorizing Provider  cyclobenzaprine (FLEXERIL) 5 MG tablet Take 1 tablet (5 mg total) by mouth 3 (three) times daily as needed for muscle spasms. Patient not taking: Reported on 08/03/2014 10/27/13   Harden Mo, MD  ferrous sulfate 324 (65 FE) MG TBEC One tablet bid for iron poor blood. Patient not taking: Reported on 08/03/2014 05/27/14   Janne Napoleon, NP  ibuprofen (ADVIL,MOTRIN) 800 MG tablet Take 1 tablet (800 mg total) by mouth 3 (three) times daily. 10/27/13   Harden Mo, MD  Multiple Vitamins-Minerals [facility] COMPLETE PO) Take by mouth.    Historical Provider, MD  traZODone (DESYREL) 50 MG tablet Take 1 tablet  (50 mg total) by mouth at bedtime. Patient not taking: Reported on 08/03/2014 04/12/13   Billy Fischer, MD   BP 108/64 mmHg  Pulse 60  Temp(Src) 97.9 F (36.6 C) (Oral)  Resp 16  SpO2 100%  LMP 07/09/2014 Physical Exam  Constitutional: She is oriented to person, place, and time. She appears well-developed and well-nourished. No distress.  HENT:  Right EAC is clear and TMs normal Left EAC is obstructed with exogenous wax fragment  Neck: Normal range of motion. Neck supple.  Neurological: She is alert and oriented to person, place, and time.  Skin: Skin is warm and dry.  Nursing note and vitals reviewed.   ED Course  FOREIGN BODY REMOVAL Date/Time: 08/13/2014 4:26 PM Performed by: Marcha Dutton, Geraldene Eisel Authorized by: Melony Overly Consent: Verbal consent obtained. Risks and benefits: risks, benefits and alternatives were discussed Consent given by: patient Patient understanding: patient states understanding of the procedure being performed Patient identity confirmed: verbally with patient Body area: ear Location details: left ear Anesthetic total: 0 ml Patient sedated: no Patient restrained: no Patient cooperative: yes Localization method: ENT speculum and visualized Removal mechanism: irrigation and alligator forceps Complexity: complex 0 objects recovered. Objects recovered: 0 Post-procedure assessment: foreign body not removed   (including critical care time) Labs Review Labs Reviewed - No data to display  Imaging Review No results found.   MDM   1. Foreign body in ear, left, initial encounter  Attempted to remove with irrigation until patient states that that was too painful. Attempted to remove with alligator forceps. The wax was slippery and difficult to grasp. The patient was also experiencing pain whenever the tip of the forceps touched the EAC. Referred to ENT for foreign body removal.    Janne Napoleon, NP 08/13/14 1631  Janne Napoleon, NP 08/13/14 [phone]

## 2014-08-13 NOTE — ED Notes (Addendum)
C/o FB sensation in left ear. Has been using ear plugs (wax) in pool. No obvious FB noted on exam. Also has ongoing pain in her left ankle, for which she uses shea butter and tylenol

## 2014-08-13 NOTE — Discharge Instructions (Signed)
Ear Foreign Body °An ear foreign body is an object that is stuck in the ear. It is common for young children to put objects into the ear canal. These may include pebbles, beads, beans, and any other small objects which will fit. In adults, objects such as cotton swabs may become lodged in the ear canal. In all ages, the most common foreign bodies are insects that enter the ear canal.  °SYMPTOMS  °Foreign bodies may cause pain, buzzing or roaring sounds, hearing loss, and ear drainage.  °HOME CARE INSTRUCTIONS  °· Keep all follow-up appointments with your caregiver as told. °· Keep small objects out of reach of young children. Tell them not to put anything in their ears. °SEEK IMMEDIATE MEDICAL CARE IF:  °· You have bleeding from the ear. °· You have increased pain or swelling of the ear. °· You have reduced hearing. °· You have discharge coming from the ear. °· You have a fever. °· You have a headache. °MAKE SURE YOU:  °· Understand these instructions. °· Will watch your condition. °· Will get help right away if you are not doing well or get worse. °Document Released: 04/12/2000 Document Revised: 07/08/2011 Document Reviewed: 12/02/2007 °ExitCare® Patient Information ©2015 [employer]. This information is not intended to replace advice given to you by your health care provider. Make sure you discuss any questions you have with your health care provider. ° °

## 2014-08-31 ENCOUNTER — Ambulatory Visit (INDEPENDENT_AMBULATORY_CARE_PROVIDER_SITE_OTHER): Payer: Medicare Other | Admitting: Podiatry

## 2014-08-31 ENCOUNTER — Encounter: Payer: Self-pay | Admitting: Podiatry

## 2014-08-31 VITALS — BP 97/56 | HR 85 | Resp 18

## 2014-08-31 DIAGNOSIS — M19072 Primary osteoarthritis, left ankle and foot: Secondary | ICD-10-CM

## 2014-09-01 NOTE — Progress Notes (Signed)
Patient ID: Anne Brewer, female   DOB: 1971/01/25, 44 y.o.   MRN: [id number]  Subjective: 44 year old female presents the office today for follow valuation the left ankle and foot pain. She states that since last point she didn't get urgent care and they gave her an anti-inflammatory for which his mentation seems to help. She has been unable to get the brace for her ankle due to cost. She did states that she purchase new shoes which seems to help. She does that her pain has improved quite a bit since last appointment. She denies any recent injury or trauma. Denies any increase in swelling or any redness over the area. Denies any systemic complaints such as fevers, chills, nausea, vomiting. No acute changes since last appointment, and no other complaints at this time.   Objective: AAO x3, NAD DP/PT pulses palpable bilaterally, CRT less than 3 seconds Protective sensation intact with Simms Weinstein monofilament, vibratory sensation intact, Achilles tendon reflex intact There is a decreased range of motion of the subtalar joint with mild crepitation. There is no restriction of motion of the ankle joint and there is no pain with ankle joint range of motion. There is currently no areas of tenderness to palpation or pain with vibratory sensation. There is a significant decrease in medial arch height upon weightbearing on the left. Equinus is present. There is no other areas of tenderness of bilateral lower extremities. MMT 5/5, ROM WNL. No edema, erythema, increase in warmth to bilateral lower extremities.  No open lesions or pre-ulcerative lesions.  No pain with calf compression, swelling, warmth, erythema  Assessment: 44 year old female for evaluation left foot and ankle pain, flatfoot deformity with arthritis.  Plan: -All treatment options discussed with the patient including all alternatives, risks, complications.  -At this time recommended continue with shoe gear modifications. Also recommended  her to get the AFO in the near future. I did dispense a ankle brace to help support the ankle. I also discussed possible steroid injection to the area however this time she is not symptomatic so we will hold off on doing this. - Follow up in 6-8 weeks or sooner should any problems arise.  -Patient encouraged to call the office with any questions, concerns, change in symptoms.

## 2014-10-26 ENCOUNTER — Ambulatory Visit: Payer: Medicare Other | Admitting: Podiatry

## 2014-11-21 ENCOUNTER — Encounter: Payer: Self-pay | Admitting: Podiatry

## 2014-11-21 ENCOUNTER — Ambulatory Visit (INDEPENDENT_AMBULATORY_CARE_PROVIDER_SITE_OTHER): Payer: Medicare Other | Admitting: Podiatry

## 2014-11-21 ENCOUNTER — Ambulatory Visit (INDEPENDENT_AMBULATORY_CARE_PROVIDER_SITE_OTHER): Payer: Medicare Other

## 2014-11-21 VITALS — BP 108/58 | HR 60 | Resp 15

## 2014-11-21 DIAGNOSIS — M79672 Pain in left foot: Secondary | ICD-10-CM

## 2014-11-21 DIAGNOSIS — M19072 Primary osteoarthritis, left ankle and foot: Secondary | ICD-10-CM

## 2014-11-21 DIAGNOSIS — M79671 Pain in right foot: Secondary | ICD-10-CM | POA: Diagnosis not present

## 2014-11-21 DIAGNOSIS — G5752 Tarsal tunnel syndrome, left lower limb: Secondary | ICD-10-CM | POA: Diagnosis not present

## 2014-11-21 DIAGNOSIS — M25572 Pain in left ankle and joints of left foot: Secondary | ICD-10-CM

## 2014-11-21 NOTE — Progress Notes (Signed)
Patient ID: Anne Brewer, female   DOB: April 17, 1971, 44 y.o.   MRN: [id number]  Subjective: Ms. Anne Brewer presents the office today for follow valuation the left ankle and foot pain. She states that overall she is doing better. She has been wearing the ankle brace as needed. She has decreased her gym activity which seems to help her foot. She is taking tylenol for pain as needed. No acute changes since last appointment. Denies any systemic complaints such as fevers, chills, nausea, vomiting. No acute changes since last appointment, and no other complaints at this time.   Objective: AAO x3, NAD DP/PT pulses palpable bilaterally, CRT less than 3 seconds Protective sensation intact with Simms Weinstein monofilament, vibratory sensation intact, Achilles tendon reflex intact There is a decreased range of motion of the subtalar joint with mild crepitation. There is tenderness over the lateral portion of the sinus tarsi of the foot. There is no restriction of motion of the ankle joint and there is no pain with ankle joint range of motion. There continues to be no areas of tenderness to palpation or pain with vibratory sensation. There is a significant decrease in medial arch height upon weightbearing on the left. Equinus is present. There is no other areas of tenderness of bilateral lower extremities. MMT 5/5, ROM WNL. No edema, erythema, increase in warmth to bilateral lower extremities.  No open lesions or pre-ulcerative lesions.  No pain with calf compression, swelling, warmth, erythema  Assessment: 44 year old female for evaluation left foot and ankle pain, flatfoot deformity with arthritis; STJ pain  Plan: -All treatment options discussed with the patient including all alternatives, risks, complications.  -Discussed steroid injection of the sinus tarsi as she is requesting this. Discussed risks and complications of the injection and she understands and consents. Under sterile conditions a total of 1.5  mL mixture of dexamethasone phosphate and 2% lidocaine plain was infiltrated into the lateral aspect of the sinus tarsi over the area of maximal tenderness the left foot. Patient tolerated the injection well any applications. - Also given her flatfoot and arthritis and she would likely benefit from a [place] brace. I will have her follow-up with [name].   -Patient encouraged to call the office with any questions, concerns, change in symptoms.   [name], DPM

## 2014-12-07 ENCOUNTER — Other Ambulatory Visit: Payer: Medicare Other

## 2015-01-12 ENCOUNTER — Emergency Department (HOSPITAL_COMMUNITY)
Admission: EM | Admit: 2015-01-12 | Discharge: 2015-01-12 | Disposition: A | Discharge status: Home / Self Care | Payer: Medicare Other | Attending: Emergency Medicine | Admitting: Emergency Medicine

## 2015-01-12 ENCOUNTER — Emergency Department (HOSPITAL_COMMUNITY): Payer: Medicare Other

## 2015-01-12 ENCOUNTER — Emergency Department (HOSPITAL_BASED_OUTPATIENT_CLINIC_OR_DEPARTMENT_OTHER): Payer: Medicare Other

## 2015-01-12 ENCOUNTER — Encounter (HOSPITAL_COMMUNITY): Payer: Self-pay | Admitting: General Practice

## 2015-01-12 DIAGNOSIS — Z79899 Other long term (current) drug therapy: Secondary | ICD-10-CM | POA: Diagnosis not present

## 2015-01-12 DIAGNOSIS — Z791 Long term (current) use of non-steroidal anti-inflammatories (NSAID): Secondary | ICD-10-CM | POA: Diagnosis not present

## 2015-01-12 DIAGNOSIS — Z720 Tobacco use: Secondary | ICD-10-CM | POA: Insufficient documentation

## 2015-01-12 DIAGNOSIS — M7989 Other specified soft tissue disorders: Secondary | ICD-10-CM | POA: Diagnosis not present

## 2015-01-12 DIAGNOSIS — M25461 Effusion, right knee: Secondary | ICD-10-CM | POA: Diagnosis not present

## 2015-01-12 DIAGNOSIS — R2241 Localized swelling, mass and lump, right lower limb: Secondary | ICD-10-CM | POA: Diagnosis present

## 2015-01-12 LAB — CBC WITH DIFFERENTIAL/PLATELET
Basophils Absolute: 0 10*3/uL (ref 0.0–0.1)
Basophils Relative: 0 %
Eosinophils Absolute: 0.1 10*3/uL (ref 0.0–0.7)
Eosinophils Relative: 1 %
HCT: 30.9 % — ABNORMAL LOW (ref 36.0–46.0)
Hemoglobin: 9.6 g/dL — ABNORMAL LOW (ref 12.0–15.0)
LYMPHS ABS: 1.2 10*3/uL (ref 0.7–4.0)
LYMPHS PCT: 14 %
MCH: 27.7 pg (ref 26.0–34.0)
MCHC: 31.1 g/dL (ref 30.0–36.0)
MCV: 89.3 fL (ref 78.0–100.0)
MONO ABS: 0.4 10*3/uL (ref 0.1–1.0)
Monocytes Relative: 5 %
Neutro Abs: 6.4 10*3/uL (ref 1.7–7.7)
Neutrophils Relative %: 79 %
PLATELETS: 277 10*3/uL (ref 150–400)
RBC: 3.46 MIL/uL — ABNORMAL LOW (ref 3.87–5.11)
RDW: 16.4 % — ABNORMAL HIGH (ref 11.5–15.5)
WBC: 8.1 10*3/uL (ref 4.0–10.5)

## 2015-01-12 LAB — BASIC METABOLIC PANEL
Anion gap: 6 (ref 5–15)
BUN: 10 mg/dL (ref 6–20)
CHLORIDE: 111 mmol/L (ref 101–111)
CO2: 24 mmol/L (ref 22–32)
CREATININE: 0.74 mg/dL (ref 0.44–1.00)
Calcium: 8.8 mg/dL — ABNORMAL LOW (ref 8.9–10.3)
GFR calc Af Amer: >60 mL/min (ref >60)
GFR calc non Af Amer: >60 mL/min (ref >60)
Glucose, Bld: 115 mg/dL — ABNORMAL HIGH (ref 65–99)
POTASSIUM: 3.3 mmol/L — AB (ref 3.5–5.1)
Sodium: 141 mmol/L (ref 135–145)

## 2015-01-12 MED ORDER — TRAMADOL HCL 50 MG PO TABS
50.0000 mg | ORAL_TABLET | Freq: Once | ORAL | Status: AC
  Administered 2015-01-12: 50 mg via ORAL
  Filled 2015-01-12: qty 1

## 2015-01-12 MED ORDER — KETOROLAC TROMETHAMINE 30 MG/ML IJ SOLN
30.0000 mg | Freq: Once | INTRAMUSCULAR | Status: AC
  Administered 2015-01-12: 30 mg via INTRAVENOUS

## 2015-01-12 MED ORDER — KETOROLAC TROMETHAMINE 60 MG/2ML IM SOLN
60.0000 mg | Freq: Once | INTRAMUSCULAR | Status: DC
  Filled 2015-01-12: qty 2

## 2015-01-12 NOTE — Progress Notes (Signed)
Orthopedic Tech Progress Note Patient Details:  Anne Brewer 1971/01/09 [id number]  Ortho Devices Type of Ortho Device: Knee Immobilizer Ortho Device/Splint Interventions: Application   Irish Elders 01/12/2015, 12:38 PM

## 2015-01-12 NOTE — ED Notes (Signed)
Pt complaining of right leg swelling that started 2 days ago. Pt reporting pain a 10/10. Pt denies any SOB or CP. Pt is A/O.

## 2015-01-12 NOTE — Discharge Instructions (Signed)

## 2015-01-12 NOTE — Progress Notes (Signed)
VASCULAR LAB PRELIMINARY  PRELIMINARY  PRELIMINARY  PRELIMINARY  Right lower extremity venous duplex completed.    Preliminary report:  There is no DVT or SVT noted in the right lower extremity.   Emonee Winkowski, RVT 01/12/2015, 10:31 AM

## 2015-01-12 NOTE — ED Provider Notes (Signed)
CSN: [id number]     Arrival date & time 01/12/15  8657 History   First MD Initiated Contact with Patient 01/12/15 [phone]     Chief Complaint  Patient presents with  . Leg Swelling     (Consider location/radiation/quality/duration/timing/severity/associated sxs/prior Treatment) HPI Comments: Pt comes in with c/o right sided knee pain and lower leg swelling. Denies any injury. She states that she thinks she is retaining fluid. Denies cp or sob. She states that the swelling is localized to the right leg. No history of clots. No family history.  The history is provided by the patient. No language interpreter was used.    No past medical history on file. Past Surgical History  Procedure Laterality Date  . Abdominal surgery    . Cesarean section    . Cholecystectomy     No family history on file. Social History  Substance Use Topics  . Smoking status: Current Every Day Smoker -- 0.30 packs/day    Types: Cigarettes  . Smokeless tobacco: Not on file  . Alcohol Use: No   OB History    No data available     Review of Systems  All other systems reviewed and are negative.     Allergies  Shellfish allergy  Home Medications   Prior to Admission medications   Medication Sig Start Date End Date Taking? Authorizing Provider  BIOTIN PO Take 2 tablets by mouth daily.    Historical Provider, MD  ibuprofen (ADVIL,MOTRIN) 800 MG tablet Take 1 tablet (800 mg total) by mouth 3 (three) times daily. 10/27/13   Harden Mo, MD  Multiple Vitamins-Minerals [facility] COMPLETE PO) Take by mouth daily.     Historical Provider, MD  vitamin B-12 (CYANOCOBALAMIN) 1000 MCG tablet Take 1,000 mcg by mouth 2 (two) times daily.    Historical Provider, MD   There were no vitals taken for this visit. Physical Exam  Constitutional: She is oriented to person, place, and time. She appears well-developed and well-nourished.  Cardiovascular: Normal rate and regular rhythm.   Pulmonary/Chest: Effort normal and  breath sounds normal.  Abdominal: Soft. Bowel sounds are normal. There is no rebound.  Musculoskeletal:  Swelling noted to the right lower leg. Edema noted  Neurological: She is alert and oriented to person, place, and time. Coordination normal.  Skin: Skin is warm and dry.  Nursing note and vitals reviewed.   ED Course  Procedures (including critical care time) Labs Review Labs Reviewed  BASIC METABOLIC PANEL - Abnormal; Notable for the following:    Potassium 3.3 (*)    Glucose, Bld 115 (*)    Calcium 8.8 (*)    All other components within normal limits  CBC WITH DIFFERENTIAL/PLATELET - Abnormal; Notable for the following:    RBC 3.46 (*)    Hemoglobin 9.6 (*)    HCT 30.9 (*)    RDW 16.4 (*)    All other components within normal limits    Imaging Review Dg Knee Complete 4 Views Right  01/12/2015   CLINICAL DATA:  Acute right knee pain and swelling without known injury. Initial encounter.  EXAM: RIGHT KNEE - COMPLETE 4+ VIEW  COMPARISON:  None.  FINDINGS: No fracture or dislocation is noted. Mild suprapatellar joint effusion is noted. Moderate narrowing of patellofemoral space is noted. Moderate narrowing of lateral joint space is noted. Severe narrowing of medial joint space is noted. No soft tissue abnormality is noted.  IMPRESSION: Moderate to severe degenerative joint disease is noted. Mild suprapatellar joint  effusion is noted. No fracture or dislocation is noted.   Electronically Signed   By: Marijo Conception, M.D.   On: 01/12/2015 11:28   I have personally reviewed and evaluated these images and lab results as part of my medical decision-making.   EKG Interpretation None      MDM   Final diagnoses:  Knee effusion, right    Pt order immobilizer. She is refusing crutches. Discussed with pt why diuretic in not appropriate at this time.   Glendell Docker, NP 01/12/15 1212  Dorie Rank, MD 01/12/15 1230

## 2015-02-01 ENCOUNTER — Inpatient Hospital Stay (HOSPITAL_COMMUNITY)
Admission: AD | Admit: 2015-02-01 | Discharge: 2015-02-01 | Disposition: A | Discharge status: Home / Self Care | Payer: Medicare Other | Source: Ambulatory Visit | Attending: Obstetrics & Gynecology | Admitting: Obstetrics & Gynecology

## 2015-02-01 ENCOUNTER — Encounter (HOSPITAL_COMMUNITY): Payer: Self-pay

## 2015-02-01 DIAGNOSIS — L309 Dermatitis, unspecified: Secondary | ICD-10-CM | POA: Insufficient documentation

## 2015-02-01 DIAGNOSIS — L299 Pruritus, unspecified: Secondary | ICD-10-CM | POA: Diagnosis present

## 2015-02-01 DIAGNOSIS — F17210 Nicotine dependence, cigarettes, uncomplicated: Secondary | ICD-10-CM | POA: Insufficient documentation

## 2015-02-01 DIAGNOSIS — L259 Unspecified contact dermatitis, unspecified cause: Secondary | ICD-10-CM | POA: Insufficient documentation

## 2015-02-01 LAB — WET PREP, GENITAL
TRICH WET PREP: NONE SEEN
Yeast Wet Prep HPF POC: NONE SEEN

## 2015-02-01 LAB — URINALYSIS, ROUTINE W REFLEX MICROSCOPIC
Bilirubin Urine: NEGATIVE
Glucose, UA: NEGATIVE mg/dL
Hgb urine dipstick: NEGATIVE
Ketones, ur: NEGATIVE mg/dL
LEUKOCYTES UA: NEGATIVE
Nitrite: NEGATIVE
PROTEIN: NEGATIVE mg/dL
SPECIFIC GRAVITY, URINE: 1.015 (ref 1.005–1.030)
UROBILINOGEN UA: 2 mg/dL — AB (ref 0.0–1.0)
pH: 7 (ref 5.0–8.0)

## 2015-02-01 LAB — POCT PREGNANCY, URINE: Preg Test, Ur: NEGATIVE

## 2015-02-01 MED ORDER — HYDROCORTISONE 1 % EX CREA
TOPICAL_CREAM | Freq: Once | CUTANEOUS | Status: AC
  Administered 2015-02-01: 22:00:00 via TOPICAL
  Filled 2015-02-01: qty 28

## 2015-02-01 MED ORDER — DIPHENHYDRAMINE HCL 50 MG/ML IJ SOLN
25.0000 mg | Freq: Once | INTRAMUSCULAR | Status: AC
  Administered 2015-02-01: 25 mg via INTRAMUSCULAR
  Filled 2015-02-01: qty 1

## 2015-02-01 NOTE — MAU Note (Signed)
Pt states that she has hives and itching over vaginal area, bottom, and legs. She states she is concerned about sexually transmitted diseases. She states she has vaginal discharge.

## 2015-02-01 NOTE — Discharge Instructions (Signed)
Contact Dermatitis Dermatitis is redness, soreness, and swelling (inflammation) of the skin. Contact dermatitis is a reaction to certain substances that touch the skin. There are two types of contact dermatitis:   Irritant contact dermatitis. This type is caused by something that irritates your skin, such as dry hands from washing them too much. This type does not require previous exposure to the substance for a reaction to occur. This type is more common.  Allergic contact dermatitis. This type is caused by a substance that you are allergic to, such as a nickel allergy or poison ivy. This type only occurs if you have been exposed to the substance (allergen) before. Upon a repeat exposure, your body reacts to the substance. This type is less common. CAUSES  Many different substances can cause contact dermatitis. Irritant contact dermatitis is most commonly caused by exposure to:   Makeup.   Soaps.   Detergents.   Bleaches.   Acids.   Metal salts, such as nickel.  Allergic contact dermatitis is most commonly caused by exposure to:   Poisonous plants.   Chemicals.   Jewelry.   Latex.   Medicines.   Preservatives in products, such as clothing.  RISK FACTORS This condition is more likely to develop in:   People who have jobs that expose them to irritants or allergens.  People who have certain medical conditions, such as asthma or eczema.  SYMPTOMS  Symptoms of this condition may occur anywhere on your body where the irritant has touched you or is touched by you. Symptoms include:  Dryness or flaking.   Redness.   Cracks.   Itching.   Pain or a burning feeling.   Blisters.  Drainage of small amounts of blood or clear fluid from skin cracks. With allergic contact dermatitis, there may also be swelling in areas such as the eyelids, mouth, or genitals.  DIAGNOSIS  This condition is diagnosed with a medical history and physical exam. A patch skin test  may be performed to help determine the cause. If the condition is related to your job, you may need to see an occupational medicine specialist. TREATMENT Treatment for this condition includes figuring out what caused the reaction and protecting your skin from further contact. Treatment may also include:   Steroid creams or ointments. Oral steroid medicines may be needed in more severe cases.  Antibiotics or antibacterial ointments, if a skin infection is present.  Antihistamine lotion or an antihistamine taken by mouth to ease itching.  A bandage (dressing). HOME CARE INSTRUCTIONS Skin Care  Moisturize your skin as needed.   Apply cool compresses to the affected areas.  Try taking a bath with:  Epsom salts. Follow the instructions on the packaging. You can get these at your local pharmacy or grocery store.  Baking soda. Pour a small amount into the bath as directed by your health care provider.  Colloidal oatmeal. Follow the instructions on the packaging. You can get this at your local pharmacy or grocery store.  Try applying baking soda paste to your skin. Stir water into baking soda until it reaches a paste-like consistency.  Do not scratch your skin.  Bathe less frequently, such as every other day.  Bathe in lukewarm water. Avoid using hot water. Medicines  Take or apply over-the-counter and prescription medicines only as told by your health care provider.   If you were prescribed an antibiotic medicine, take or apply your antibiotic as told by your health care provider. Do not stop using the   antibiotic even if your condition starts to improve. General Instructions  Keep all follow-up visits as told by your health care provider. This is important.  Avoid the substance that caused your reaction. If you do not know what caused it, keep a journal to try to track what caused it. Write down:  What you eat.  What cosmetic products you use.  What you drink.  What  you wear in the affected area. This includes jewelry.  If you were given a dressing, take care of it as told by your health care provider. This includes when to change and remove it. SEEK MEDICAL CARE IF:   Your condition does not improve with treatment.  Your condition gets worse.  You have signs of infection such as swelling, tenderness, redness, soreness, or warmth in the affected area.  You have a fever.  You have new symptoms. SEEK IMMEDIATE MEDICAL CARE IF:   You have a severe headache, neck pain, or neck stiffness.  You vomit.  You feel very sleepy.  You notice red streaks coming from the affected area.  Your bone or joint underneath the affected area becomes painful after the skin has healed.  The affected area turns darker.  You have difficulty breathing.   This information is not intended to replace advice given to you by your health care provider. Make sure you discuss any questions you have with your health care provider.   Document Released: 04/12/2000 Document Revised: 01/04/2015 Document Reviewed: 08/31/2014 Elsevier Interactive Patient Education 2016 [employer]  

## 2015-02-01 NOTE — MAU Provider Note (Signed)
History     CSN: [id number]  Arrival date and time: 02/01/15 2042   First Provider Initiated Contact with Patient 02/01/15 2123      Chief Complaint  Patient presents with  . Pruritis   Vaginal Discharge The patient's primary symptoms include genital itching, genital lesions and vaginal discharge. This is a new problem. The current episode started in the past 7 days. The problem occurs constantly. The problem has been unchanged. The patient is experiencing no pain. She is not pregnant. Pertinent negatives include no abdominal pain, constipation, diarrhea, dysuria, fever, frequency, nausea, urgency or vomiting. The vaginal discharge was white. Nothing aggravates the symptoms. She has tried nothing for the symptoms. She is sexually active. It is possible that her partner has an STD. She uses condoms for contraception.     No past medical history on file.  Past Surgical History  Procedure Laterality Date  . Abdominal surgery    . Cesarean section    . Cholecystectomy    . Laparoscopic gastric sleeve resection      No family history on file.  Social History  Substance Use Topics  . Smoking status: Current Every Day Smoker -- 0.30 packs/day    Types: Cigarettes  . Smokeless tobacco: None  . Alcohol Use: No    Allergies:  Allergies  Allergen Reactions  . Shellfish Allergy Anaphylaxis, Hives and Swelling    Prescriptions prior to admission  Medication Sig Dispense Refill Last Dose  . acetaminophen (TYLENOL) 650 MG CR tablet Take 650 mg by mouth every 8 (eight) hours as needed for pain.   01/12/2015 at Unknown time  . BIOTIN PO Take 2 tablets by mouth daily.   Taking  . ibuprofen (ADVIL,MOTRIN) 800 MG tablet Take 1 tablet (800 mg total) by mouth 3 (three) times daily. (Patient not taking: Reported on 01/12/2015) 30 tablet 2 Taking  . Menthol, Topical Analgesic, (ICY HOT ADVANCED RELIEF EX) Apply 1 application topically daily as needed (pain).   01/11/2015 at Unknown time  .  Multiple Vitamins-Minerals (MULTI COMPLETE PO) Take by mouth daily.    01/12/2015 at Unknown time  . vitamin B-12 (CYANOCOBALAMIN) 1000 MCG tablet Take 1,000 mcg by mouth 2 (two) times daily.   01/12/2015 at Unknown time    Review of Systems  Constitutional: Negative for fever.  Gastrointestinal: Negative for nausea, vomiting, abdominal pain, diarrhea and constipation.  Genitourinary: Positive for vaginal discharge. Negative for dysuria, urgency and frequency.       Some stinging with urination    Physical Exam   Blood pressure 128/85, pulse 64, temperature 98.7 F (37.1 C), temperature source Oral, resp. rate 18, last menstrual period 01/24/2015.  Physical Exam  Nursing note and vitals reviewed. Constitutional: She is oriented to person, place, and time. She appears well-developed and well-nourished. No distress.  HENT:  Head: Normocephalic.  Cardiovascular: Normal rate.   Respiratory: Effort normal.  GI: Soft. There is no tenderness. There is no rebound.  Genitourinary:   External: no lesion. Patches of dry skin along bottom Vagina: small amount of white discharge Cervix: pink, smooth, no CMT Uterus: NSSC Adnexa: NT   Neurological: She is alert and oriented to person, place, and time.  Skin: Skin is warm and dry.  Psychiatric: She has a normal mood and affect.   Results for orders placed or performed during the hospital encounter of 02/01/15 (from the past 24 hour(s))  Urinalysis, Routine w reflex microscopic (not at [facility])     Status: Abnormal  Collection Time: 02/01/15  8:56 PM  Result Value Ref Range   Color, Urine YELLOW YELLOW   APPearance CLEAR CLEAR   Specific Gravity, Urine 1.015 1.005 - 1.030   pH 7.0 5.0 - 8.0   Glucose, UA NEGATIVE NEGATIVE mg/dL   Hgb urine dipstick NEGATIVE NEGATIVE   Bilirubin Urine NEGATIVE NEGATIVE   Ketones, ur NEGATIVE NEGATIVE mg/dL   Protein, ur NEGATIVE NEGATIVE mg/dL   Urobilinogen, UA 2.0 (H) 0.0 - 1.0 mg/dL   Nitrite NEGATIVE  NEGATIVE   Leukocytes, UA NEGATIVE NEGATIVE  Pregnancy, urine POC     Status: None   Collection Time: 02/01/15  9:19 PM  Result Value Ref Range   Preg Test, Ur NEGATIVE NEGATIVE  Wet prep, genital     Status: Abnormal   Collection Time: 02/01/15  9:30 PM  Result Value Ref Range   Yeast Wet Prep HPF POC NONE SEEN NONE SEEN   Trich, Wet Prep NONE SEEN NONE SEEN   Clue Cells Wet Prep HPF POC FEW (A) NONE SEEN   WBC, Wet Prep HPF POC FEW (A) NONE SEEN     MAU Course  Procedures  MDM Patient was given 25mg  benadryl IM and cortisone cream to apply to affected areas.  Patient requesting pain medication as well. Advised patient that pain medication is not inicated at this time. OK to take tylenol and ibuprofen OTC.   Assessment and Plan   1. Eczema   2. Contact dermatitis and eczema due to cause    DC home Comfort measures reviewed  RX: none. Patient has leftover cortisone cream given here to use at home. Return to MAU as needed   Follow-up Information    Schedule an appointment as soon as possible for a visit with [facility] [place].   Contact information:   [place] [address] [phone]       Mathis Bud 02/01/2015, 9:25 PM

## 2015-02-02 LAB — RPR: RPR: NONREACTIVE

## 2015-02-02 LAB — GC/CHLAMYDIA PROBE AMP (~~LOC~~) NOT AT ARMC
CHLAMYDIA, DNA PROBE: NEGATIVE
NEISSERIA GONORRHEA: NEGATIVE

## 2015-02-02 LAB — HIV ANTIBODY (ROUTINE TESTING W REFLEX): HIV Screen 4th Generation wRfx: NONREACTIVE

## 2015-04-04 ENCOUNTER — Emergency Department (INDEPENDENT_AMBULATORY_CARE_PROVIDER_SITE_OTHER)
Admission: EM | Admit: 2015-04-04 | Discharge: 2015-04-04 | Disposition: A | Discharge status: Home / Self Care | Payer: Medicare Other | Source: Home / Self Care | Attending: Emergency Medicine | Admitting: Emergency Medicine

## 2015-04-04 ENCOUNTER — Encounter (HOSPITAL_COMMUNITY): Payer: Self-pay | Admitting: *Deleted

## 2015-04-04 DIAGNOSIS — B86 Scabies: Secondary | ICD-10-CM

## 2015-04-04 MED ORDER — DIPHENHYDRAMINE HCL 50 MG/ML IJ SOLN
INTRAMUSCULAR | Status: AC
  Filled 2015-04-04: qty 1

## 2015-04-04 MED ORDER — DIPHENHYDRAMINE HCL 50 MG/ML IJ SOLN
25.0000 mg | Freq: Once | INTRAMUSCULAR | Status: AC
  Administered 2015-04-04: 25 mg via INTRAMUSCULAR

## 2015-04-04 MED ORDER — PERMETHRIN 5 % EX CREA: TOPICAL_CREAM | CUTANEOUS | Status: DC

## 2015-04-04 MED ORDER — TRIAMCINOLONE ACETONIDE 0.1 % EX CREA: 1.0000 "application " | TOPICAL_CREAM | Freq: Two times a day (BID) | CUTANEOUS | Status: DC | PRN

## 2015-04-04 MED ORDER — HYDROXYZINE HCL 25 MG PO TABS: 25.0000 mg | ORAL_TABLET | Freq: Four times a day (QID) | ORAL | Status: DC | PRN

## 2015-04-04 NOTE — ED Provider Notes (Signed)
CSN: [id number]     Arrival date & time 04/04/15  1748 History   First MD Initiated Contact with Patient 04/04/15 1815     Chief Complaint  Patient presents with  . Rash   (Consider location/radiation/quality/duration/timing/severity/associated sxs/prior Treatment) HPI  She is a 44 year old woman here for evaluation of rash. She states it started about 2 weeks ago. It is very itchy. It is located all over her body, but worse on her abdomen and back. She did get a new medicine, indomethacin, around that time. She also states she has been staying at a friend's house that is not very clean.  History reviewed. No pertinent past medical history. Past Surgical History  Procedure Laterality Date  . Abdominal surgery    . Cesarean section    . Cholecystectomy    . Laparoscopic gastric sleeve resection     History reviewed. No pertinent family history. Social History  Substance Use Topics  . Smoking status: Current Every Day Smoker -- 0.30 packs/day    Types: Cigarettes  . Smokeless tobacco: None  . Alcohol Use: No   OB History    No data available     Review of Systems As in history of present illness Allergies  Shellfish allergy  Home Medications   Prior to Admission medications   Medication Sig Start Date End Date Taking? Authorizing Provider  acetaminophen (TYLENOL) 650 MG CR tablet Take 650 mg by mouth every 8 (eight) hours as needed for pain.    Historical Provider, MD  BIOTIN PO Take 2 tablets by mouth daily.    Historical Provider, MD  hydrOXYzine (ATARAX/VISTARIL) 25 MG tablet Take 1 tablet (25 mg total) by mouth every 6 (six) hours as needed for itching. 04/04/15   Melony Overly, MD  ibuprofen (ADVIL,MOTRIN) 800 MG tablet Take 1 tablet (800 mg total) by mouth 3 (three) times daily. Patient not taking: Reported on 01/12/2015 10/27/13   Harden Mo, MD  Menthol, Topical Analgesic, (ICY HOT ADVANCED RELIEF EX) Apply 1 application topically daily as needed (pain).     Historical Provider, MD  Multiple Vitamins-Minerals (MULTI COMPLETE PO) Take by mouth daily.     Historical Provider, MD  permethrin (ELIMITE) 5 % cream Apply from neck down.  Leave in place 12 hours, then rinse off.  Repeat in 1 week. 04/04/15   Melony Overly, MD  triamcinolone cream (KENALOG) 0.1 % Apply 1 application topically 2 (two) times daily as needed. For itching 04/04/15   Melony Overly, MD  vitamin B-12 (CYANOCOBALAMIN) 1000 MCG tablet Take 1,000 mcg by mouth 2 (two) times daily.    Historical Provider, MD   Meds Ordered and Administered this Visit   Medications  diphenhydrAMINE (BENADRYL) injection 25 mg (not administered)    BP 146/83 mmHg  Pulse 66  Temp(Src) 98.2 F (36.8 C) (Oral)  Resp 16  SpO2 100%  LMP 03/08/2015 No data found.   Physical Exam  Constitutional: She is oriented to person, place, and time. She appears well-developed. No distress.  Cardiovascular: Normal rate.   Pulmonary/Chest: Effort normal.  Neurological: She is alert and oriented to person, place, and time.  Skin:  Multiple papules on hands, abdomen, back, and legs. Some of these are excoriated.    ED Course  Procedures (including critical care time)  Labs Review Labs Reviewed - No data to display  Imaging Review No results found.    MDM   1. Scabies    Rash is consistent with scabies.  Treat with permethrin. Triamcinolone and hydroxyzine for symptomatic relief. Handout given.    Melony Overly, MD 04/04/15 [phone]

## 2015-04-04 NOTE — ED Notes (Signed)
Rash   X  2   Weeks  That  Itches       She  Has  Been    Staying  At  Another  Persons  House   Worse  At  Night

## 2015-04-04 NOTE — Discharge Instructions (Signed)

## 2015-04-04 NOTE — ED Notes (Signed)
Pt  Was  Advised   Not  To  Drive       She  States  She  Will  not

## 2015-05-22 ENCOUNTER — Encounter (HOSPITAL_COMMUNITY): Payer: Self-pay | Admitting: Emergency Medicine

## 2015-05-22 ENCOUNTER — Emergency Department (INDEPENDENT_AMBULATORY_CARE_PROVIDER_SITE_OTHER)
Admission: EM | Admit: 2015-05-22 | Discharge: 2015-05-22 | Disposition: A | Discharge status: Home / Self Care | Payer: Medicare Other | Source: Home / Self Care | Attending: Emergency Medicine | Admitting: Emergency Medicine

## 2015-05-22 DIAGNOSIS — M25531 Pain in right wrist: Secondary | ICD-10-CM | POA: Diagnosis not present

## 2015-05-22 DIAGNOSIS — M654 Radial styloid tenosynovitis [de Quervain]: Secondary | ICD-10-CM

## 2015-05-22 DIAGNOSIS — M25532 Pain in left wrist: Secondary | ICD-10-CM | POA: Diagnosis not present

## 2015-05-22 MED ORDER — DICLOFENAC SODIUM 75 MG PO TBEC: 75.0000 mg | DELAYED_RELEASE_TABLET | Freq: Two times a day (BID) | ORAL | Status: DC

## 2015-05-22 NOTE — ED Notes (Signed)
The patient presented to the [facility] with a complaint of bilateral wrist pain secondary to lifting boxes.

## 2015-05-22 NOTE — Discharge Instructions (Signed)
Wear the wrist splint as much as possible, especially at night. Take the diclofenac twice a day for the next 2 weeks. Take the Tylenol for as needed for severe pain. you may take 1 g of Tylenol with the diclofenac. This is an effective combination for pain. Do not exceed 4 g of Tylenol from all sources in one day, because too much Tylenol can hurt your liver. Follow-up with Dr. Caralyn Guile if no better in 2 weeks. Go to the ER for any color changes in your hand, fevers

## 2015-05-22 NOTE — ED Provider Notes (Signed)
HPI  SUBJECTIVE:  Anne Brewer is a right handed 45 y.o. female who presents with intermittent bilateral wrist pain described as soreness, achy, pulling for the past several weeks. States the last 1-2 hours and then resolves. Symptoms are worse at night, with picking up heavy objects, wrist flexion. No alleviating factors. She has not tried anything for this. She states symptoms started after lifting a lot of heavy boxes. She denies any other injury to her wrist. No previous history of injury to this wrist. No numbness, tingling. She reports some localized swelling. No erythema. No fevers. Past medical history noted for chronic kidney disease, diabetes, hypertension. She has chronic knee pain for which she takes Tylenol No. 4 when necessary. Patient is a smoker. PMD: [facility]. LMP 1 week ago, denies possibility of being pregnant.    History reviewed. No pertinent past medical history.  Past Surgical History  Procedure Laterality Date  . Abdominal surgery    . Cesarean section    . Cholecystectomy    . Laparoscopic gastric sleeve resection      History reviewed. No pertinent family history.  Social History  Substance Use Topics  . Smoking status: Current Every Day Smoker -- 0.30 packs/day    Types: Cigarettes  . Smokeless tobacco: None  . Alcohol Use: No    No current facility-administered medications for this encounter.  Current outpatient prescriptions:  .  acetaminophen (TYLENOL) 650 MG CR tablet, Take 650 mg by mouth every 8 (eight) hours as needed for pain., Disp: , Rfl:  .  BIOTIN PO, Take 2 tablets by mouth daily., Disp: , Rfl:  .  diclofenac (VOLTAREN) 75 MG EC tablet, Take 1 tablet (75 mg total) by mouth 2 (two) times daily. Take with food, Disp: 30 tablet, Rfl: 0 .  hydrOXYzine (ATARAX/VISTARIL) 25 MG tablet, Take 1 tablet (25 mg total) by mouth every 6 (six) hours as needed for itching., Disp: 30 tablet, Rfl: 0 .  Menthol, Topical Analgesic, (ICY HOT  ADVANCED RELIEF EX), Apply 1 application topically daily as needed (pain)., Disp: , Rfl:  .  Multiple Vitamins-Minerals (MULTI COMPLETE PO), Take by mouth daily. , Disp: , Rfl:  .  vitamin B-12 (CYANOCOBALAMIN) 1000 MCG tablet, Take 1,000 mcg by mouth 2 (two) times daily., Disp: , Rfl:  .  [DISCONTINUED] ferrous sulfate 324 (65 FE) MG TBEC, One tablet bid for iron poor blood. (Patient not taking: Reported on 08/03/2014), Disp: 60 tablet, Rfl: 0 .  [DISCONTINUED] traZODone (DESYREL) 50 MG tablet, Take 1 tablet (50 mg total) by mouth at bedtime. (Patient not taking: Reported on 08/03/2014), Disp: 10 tablet, Rfl: 0  Allergies  Allergen Reactions  . Shellfish Allergy Anaphylaxis, Hives and Swelling     ROS  As noted in HPI.   Physical Exam  BP 122/89 mmHg  Pulse 57  Temp(Src) 97.5 F (36.4 C) (Oral)  Resp 16  SpO2 100%  LMP 05/15/2015 (Exact Date)  Constitutional: Well developed, well nourished, no acute distress Eyes:  EOMI, conjunctiva normal bilaterally HENT: Normocephalic, atraumatic,mucus membranes moist Respiratory: Normal inspiratory effort Cardiovascular: Normal rate GI: nondistended skin: No rash, skin intact Musculoskeletal: R wrist: tenderness along EPB, positive Finkelstein. Negative Tinel, Phalen. No erythema, significant swelling. distal radius NT , distal ulnar styloid NT , snuffbox NT, carpals NT , metacarpals NT , digits NT, Motor intact ability to flex / extend digits of affected hand, Sensation LT to hand normal, CR<2 seconds distally.  Shoulder and upper arm NT, Elbow and  proximal forearm NT on affected extremity. L wrist: tenderness along EPB, positive Finkelstein. Negative Tinel, Phalen. No erythema, significant swelling. distal radius NT , distal ulnar styloid NT , snuffbox NT, carpals NT , metacarpals NT , digits NT, Motor intact ability to flex / extend digits of affected hand, Sensation LT to hand normal, CR<2 seconds distally.  Shoulder and upper arm NT, Elbow and  proximal forearm NT on affected extremity. Neurologic: Alert & oriented x 3, no focal neuro deficits Psychiatric: Speech and behavior appropriate   ED Course   Medications - No data to display  Orders Placed This Encounter  Procedures  . Apply wrist splint    Standing Status: Standing     Number of Occurrences: 1     Standing Expiration Date:     Order Specific Question:  Laterality    Answer:  Bilateral    No results found for this or any previous visit (from the past 24 hour(s)). No results found.  ED Clinical Impression  De Quervain's tenosynovitis, bilateral  Pain in both wrists   ED Assessment/Plan  Condition consistent with de Quervain's tenosynovitis/wrist sprain. Placing him bilateral wrist immobilizers. Home with diclofenac twice a day for 14 days. She may take Tylenol for as needed for severe pain. Follow-up with hand surgeon on call Dr. Caralyn Guile if not better in 2 weeks. No bony tenderness, doubt fracture or dislocation, deferring imaging..  Discussed  MDM, plan and followup with patient. Patient  agrees with plan.   *This clinic note was created using Dragon dictation software. Therefore, there may be occasional mistakes despite careful proofreading.  ?   Melynda Ripple, MD 05/23/15 1028

## 2015-06-29 ENCOUNTER — Other Ambulatory Visit: Payer: Self-pay | Admitting: Physician Assistant

## 2015-06-29 NOTE — H&P (Signed)
TOTAL KNEE ADMISSION H&P  Patient is being admitted for right total knee arthroplasty.  Subjective:  Chief Complaint:right knee pain.  HPI: Anne Brewer, 45 y.o. female, has a history of pain and functional disability in the right knee due to arthritis and has failed non-surgical conservative treatments for greater than 12 weeks to includeNSAID's and/or analgesics and corticosteriod injections.  Onset of symptoms was abrupt, starting 1 years ago with rapidlly worsening course since that time. The patient noted no past surgery on the right knee(s).  Patient currently rates pain in the right knee(s) at 10 out of 10 with activity. Patient has night pain, worsening of pain with activity and weight bearing, pain that interferes with activities of daily living, pain with passive range of motion, crepitus and joint swelling.  Patient has evidence of subchondral sclerosis and joint space narrowing by imaging studies. There is no active infection.  There are no active problems to display for this patient.  No past medical history on file.  Past Surgical History  Procedure Laterality Date  . Abdominal surgery    . Cesarean section    . Cholecystectomy    . Laparoscopic gastric sleeve resection       (Not in a hospital admission) Allergies  Allergen Reactions  . Shellfish Allergy Anaphylaxis, Hives and Swelling    Social History  Substance Use Topics  . Smoking status: Current Every Day Smoker -- 0.30 packs/day    Types: Cigarettes  . Smokeless tobacco: Not on file  . Alcohol Use: No    No family history on file.   Review of Systems  Constitutional: Negative.   HENT: Negative.   Eyes: Negative.   Respiratory: Negative.   Cardiovascular: Negative.   Gastrointestinal: Negative.   Genitourinary: Negative.   Musculoskeletal: Positive for joint pain.  Skin: Negative.   Neurological: Negative.   Endo/Heme/Allergies: Negative.   Psychiatric/Behavioral: The patient is nervous/anxious.      Objective:  Physical Exam  Constitutional: She is oriented to person, place, and time. She appears well-developed and well-nourished.  HENT:  Head: Normocephalic and atraumatic.  Eyes: EOM are normal. Pupils are equal, round, and reactive to light.  Neck: Normal range of motion. Neck supple.  Cardiovascular: Normal rate and regular rhythm.   Respiratory: Effort normal and breath sounds normal.  GI: Soft. Bowel sounds are normal.  Musculoskeletal:  range of motion 0-90 degrees.  Minimal patellofemoral crepitus.  Marked lateral joint line tenderness.  She is neurovascularly intact distally.   Neurological: She is alert and oriented to person, place, and time.  Skin: Skin is warm and dry.  Psychiatric: She has a normal mood and affect. Her behavior is normal. Judgment and thought content normal.    Vital signs in last 24 hours: @VSRANGES @  Labs:   Estimated body mass index is 30.87 kg/(m^2) as calculated from the following:   Height as of 01/12/15: 5\' 8"  (1.727 m).   Weight as of 01/12/15: 92.08 kg (203 lb).   Imaging Review Plain radiographs demonstrate severe degenerative joint disease of the right knee(s). The overall alignment isneutral. The bone quality appears to be fair for age and reported activity level.  Assessment/Plan:  End stage arthritis, right knee   The patient history, physical examination, clinical judgment of the provider and imaging studies are consistent with end stage degenerative joint disease of the right knee(s) and total knee arthroplasty is deemed medically necessary. The treatment options including medical management, injection therapy arthroscopy and arthroplasty were discussed at  length. The risks and benefits of total knee arthroplasty were presented and reviewed. The risks due to aseptic loosening, infection, stiffness, patella tracking problems, thromboembolic complications and other imponderables were discussed. The patient acknowledged the  explanation, agreed to proceed with the plan and consent was signed. Patient is being admitted for inpatient treatment for surgery, pain control, PT, OT, prophylactic antibiotics, VTE prophylaxis, progressive ambulation and ADL's and discharge planning. The patient is planning to be discharged to skilled nursing facility

## 2015-06-30 ENCOUNTER — Encounter (HOSPITAL_COMMUNITY): Payer: Self-pay

## 2015-06-30 ENCOUNTER — Encounter (HOSPITAL_COMMUNITY)
Admission: RE | Admit: 2015-06-30 | Discharge: 2015-06-30 | Disposition: A | Discharge status: Home / Self Care | Payer: Medicare Other | Source: Ambulatory Visit | Attending: Orthopedic Surgery | Admitting: Orthopedic Surgery

## 2015-06-30 DIAGNOSIS — Z0183 Encounter for blood typing: Secondary | ICD-10-CM | POA: Insufficient documentation

## 2015-06-30 DIAGNOSIS — Z01812 Encounter for preprocedural laboratory examination: Secondary | ICD-10-CM | POA: Diagnosis not present

## 2015-06-30 DIAGNOSIS — M1711 Unilateral primary osteoarthritis, right knee: Secondary | ICD-10-CM | POA: Insufficient documentation

## 2015-06-30 DIAGNOSIS — Z01818 Encounter for other preprocedural examination: Secondary | ICD-10-CM | POA: Insufficient documentation

## 2015-06-30 DIAGNOSIS — F17210 Nicotine dependence, cigarettes, uncomplicated: Secondary | ICD-10-CM | POA: Insufficient documentation

## 2015-06-30 HISTORY — DX: Unspecified osteoarthritis, unspecified site: M19.90

## 2015-06-30 HISTORY — DX: Anemia, unspecified: D64.9

## 2015-06-30 LAB — COMPREHENSIVE METABOLIC PANEL
ALBUMIN: 3.1 g/dL — AB (ref 3.5–5.0)
ALK PHOS: 74 U/L (ref 38–126)
ALT: 13 U/L — AB (ref 14–54)
ANION GAP: 9 (ref 5–15)
AST: 19 U/L (ref 15–41)
BUN: 9 mg/dL (ref 6–20)
CALCIUM: 9 mg/dL (ref 8.9–10.3)
CHLORIDE: 108 mmol/L (ref 101–111)
CO2: 22 mmol/L (ref 22–32)
Creatinine, Ser: 0.79 mg/dL (ref 0.44–1.00)
GFR calc Af Amer: >60 mL/min (ref >60)
GFR calc non Af Amer: >60 mL/min (ref >60)
GLUCOSE: 116 mg/dL — AB (ref 65–99)
Potassium: 3.7 mmol/L (ref 3.5–5.1)
SODIUM: 139 mmol/L (ref 135–145)
Total Bilirubin: 0.4 mg/dL (ref 0.3–1.2)
Total Protein: 8 g/dL (ref 6.5–8.1)

## 2015-06-30 LAB — APTT: aPTT: 29 seconds (ref 24–37)

## 2015-06-30 LAB — CBC WITH DIFFERENTIAL/PLATELET
BASOS PCT: 0 %
Basophils Absolute: 0 10*3/uL (ref 0.0–0.1)
EOS ABS: 0.2 10*3/uL (ref 0.0–0.7)
Eosinophils Relative: 2 %
HCT: 34.2 % — ABNORMAL LOW (ref 36.0–46.0)
HEMOGLOBIN: 10.6 g/dL — AB (ref 12.0–15.0)
Lymphocytes Relative: 21 %
Lymphs Abs: 1.7 10*3/uL (ref 0.7–4.0)
MCH: 27.7 pg (ref 26.0–34.0)
MCHC: 31 g/dL (ref 30.0–36.0)
MCV: 89.3 fL (ref 78.0–100.0)
Monocytes Absolute: 0.3 10*3/uL (ref 0.1–1.0)
Monocytes Relative: 3 %
NEUTROS PCT: 74 %
Neutro Abs: 6 10*3/uL (ref 1.7–7.7)
PLATELETS: 377 10*3/uL (ref 150–400)
RBC: 3.83 MIL/uL — AB (ref 3.87–5.11)
RDW: 15 % (ref 11.5–15.5)
WBC: 8.2 10*3/uL (ref 4.0–10.5)

## 2015-06-30 LAB — HCG, SERUM, QUALITATIVE: PREG SERUM: NEGATIVE

## 2015-06-30 LAB — ABO/RH: ABO/RH(D): B POS

## 2015-06-30 LAB — TYPE AND SCREEN
ABO/RH(D): B POS
Antibody Screen: NEGATIVE

## 2015-06-30 LAB — SURGICAL PCR SCREEN
MRSA, PCR: NEGATIVE
Staphylococcus aureus: NEGATIVE

## 2015-06-30 LAB — PROTIME-INR
INR: 1.12 (ref 0.00–1.49)
Prothrombin Time: 14.6 seconds (ref 11.6–15.2)

## 2015-06-30 NOTE — Progress Notes (Signed)
PCP: Dr. Lujean Rave Pt. Refused total joint booklet.

## 2015-06-30 NOTE — Pre-Procedure Instructions (Signed)
    Anne Brewer  06/30/2015      [id number], [place] [address] Phone: [phone] Fax: [phone]    Your procedure is scheduled on Wed. March 15  Report to [facility] Admitting at 6:30A.M.  Call this number if you have problems the morning of surgery:  [phone]              Any questions prior to surgery call [phone] Monday-Friday 8am-4pm   Remember:  Do not eat food or drink liquids after midnight.  Take these medicines the morning of surgery with A SIP OF WATER :tylenol or hydrocodone if needed             Stop vitamins/herbal medicines,and nsaids: aspirin, advil, ibuprofen, aleve, BC'S, Goody's 1 week prior to surgery   Do not wear jewelry, make-up or nail polish.  Do not wear lotions, powders, or perfumes.  You may not wear deodorant.  Do not shave 48 hours prior to surgery.  Men may shave face and neck.  Do not bring valuables to the hospital.  [facility] is not responsible for any belongings or valuables.  Contacts, dentures or bridgework may not be worn into surgery.  Leave your suitcase in the car.  After surgery it may be brought to your room.  For patients admitted to the hospital, discharge time will be determined by your treatment team.  Patients discharged the day of surgery will not be allowed to drive home.    Special instructions:  Review all handouts  Please read over the following fact sheets that you were given. Pain Booklet, Coughing and Deep Breathing, Blood Transfusion Information, Total Joint Packet and Surgical Site Infection Prevention

## 2015-07-01 LAB — URINE CULTURE

## 2015-07-11 MED ORDER — CEFAZOLIN SODIUM-DEXTROSE 2-3 GM-% IV SOLR
2.0000 g | INTRAVENOUS | Status: AC
  Administered 2015-07-12: 2 g via INTRAVENOUS
  Filled 2015-07-11: qty 50

## 2015-07-11 MED ORDER — TRANEXAMIC ACID 1000 MG/10ML IV SOLN
1000.0000 mg | INTRAVENOUS | Status: AC
  Administered 2015-07-12: 1000 mg via INTRAVENOUS
  Filled 2015-07-11: qty 10

## 2015-07-11 MED ORDER — LACTATED RINGERS IV SOLN: INTRAVENOUS | Status: DC

## 2015-07-12 ENCOUNTER — Inpatient Hospital Stay (HOSPITAL_COMMUNITY)
Admission: RE | Admit: 2015-07-12 | Discharge: 2015-07-14 | DRG: 470 | Disposition: A | Discharge status: Home Health | Payer: Medicare Other | Source: Ambulatory Visit | Attending: Orthopedic Surgery | Admitting: Orthopedic Surgery

## 2015-07-12 ENCOUNTER — Encounter (HOSPITAL_COMMUNITY): Payer: Self-pay | Admitting: General Practice

## 2015-07-12 ENCOUNTER — Inpatient Hospital Stay (HOSPITAL_COMMUNITY): Payer: Medicare Other | Admitting: Certified Registered Nurse Anesthetist

## 2015-07-12 ENCOUNTER — Inpatient Hospital Stay (HOSPITAL_COMMUNITY): Payer: Medicare Other

## 2015-07-12 ENCOUNTER — Encounter (HOSPITAL_COMMUNITY)
Admission: RE | Disposition: A | Discharge status: Home Health | Payer: Self-pay | Source: Ambulatory Visit | Attending: Orthopedic Surgery

## 2015-07-12 DIAGNOSIS — Z91013 Allergy to seafood: Secondary | ICD-10-CM | POA: Diagnosis not present

## 2015-07-12 DIAGNOSIS — Z96659 Presence of unspecified artificial knee joint: Secondary | ICD-10-CM

## 2015-07-12 DIAGNOSIS — E669 Obesity, unspecified: Secondary | ICD-10-CM | POA: Diagnosis present

## 2015-07-12 DIAGNOSIS — M1711 Unilateral primary osteoarthritis, right knee: Secondary | ICD-10-CM | POA: Diagnosis present

## 2015-07-12 DIAGNOSIS — Z6831 Body mass index (BMI) 31.0-31.9, adult: Secondary | ICD-10-CM

## 2015-07-12 DIAGNOSIS — D62 Acute posthemorrhagic anemia: Secondary | ICD-10-CM | POA: Diagnosis not present

## 2015-07-12 DIAGNOSIS — F17210 Nicotine dependence, cigarettes, uncomplicated: Secondary | ICD-10-CM | POA: Diagnosis present

## 2015-07-12 DIAGNOSIS — M25561 Pain in right knee: Secondary | ICD-10-CM | POA: Diagnosis present

## 2015-07-12 HISTORY — PX: TOTAL KNEE ARTHROPLASTY: SHX125

## 2015-07-12 SURGERY — ARTHROPLASTY, KNEE, TOTAL
Anesthesia: General | Site: Knee | Laterality: Right

## 2015-07-12 MED ORDER — NEOSTIGMINE METHYLSULFATE 10 MG/10ML IV SOLN
INTRAVENOUS | Status: AC
  Filled 2015-07-12: qty 1

## 2015-07-12 MED ORDER — MIDAZOLAM HCL 2 MG/2ML IJ SOLN
INTRAMUSCULAR | Status: AC
  Filled 2015-07-12: qty 2

## 2015-07-12 MED ORDER — DIPHENHYDRAMINE HCL 12.5 MG/5ML PO ELIX: 12.5000 mg | ORAL_SOLUTION | ORAL | Status: DC | PRN

## 2015-07-12 MED ORDER — OXYCODONE-ACETAMINOPHEN 5-325 MG PO TABS: 1.0000 | ORAL_TABLET | ORAL | Status: DC | PRN

## 2015-07-12 MED ORDER — OXYCODONE HCL 5 MG PO TABS: 5.0000 mg | ORAL_TABLET | Freq: Once | ORAL | Status: DC | PRN

## 2015-07-12 MED ORDER — GLYCOPYRROLATE 0.2 MG/ML IJ SOLN
INTRAMUSCULAR | Status: AC
  Filled 2015-07-12: qty 4

## 2015-07-12 MED ORDER — PHENYLEPHRINE 40 MCG/ML (10ML) SYRINGE FOR IV PUSH (FOR BLOOD PRESSURE SUPPORT)
PREFILLED_SYRINGE | INTRAVENOUS | Status: AC
  Filled 2015-07-12: qty 10

## 2015-07-12 MED ORDER — DEXAMETHASONE SODIUM PHOSPHATE 10 MG/ML IJ SOLN
10.0000 mg | Freq: Once | INTRAMUSCULAR | Status: AC
  Administered 2015-07-13: 10 mg via INTRAVENOUS
  Filled 2015-07-12: qty 1

## 2015-07-12 MED ORDER — SODIUM CHLORIDE 0.9 % IJ SOLN
INTRAMUSCULAR | Status: AC
  Filled 2015-07-12: qty 10

## 2015-07-12 MED ORDER — GLYCOPYRROLATE 0.2 MG/ML IJ SOLN
INTRAMUSCULAR | Status: DC | PRN
Start: 2015-07-12 — End: 2015-07-12
  Administered 2015-07-12: 0.6 mg via INTRAVENOUS
  Administered 2015-07-12: 0.1 mg via INTRAVENOUS

## 2015-07-12 MED ORDER — METHOCARBAMOL 500 MG PO TABS: 500.0000 mg | ORAL_TABLET | Freq: Four times a day (QID) | ORAL | Status: DC

## 2015-07-12 MED ORDER — 0.9 % SODIUM CHLORIDE (POUR BTL) OPTIME
TOPICAL | Status: DC | PRN
  Administered 2015-07-12: 1000 mL

## 2015-07-12 MED ORDER — POTASSIUM CHLORIDE IN NACL 20-0.9 MEQ/L-% IV SOLN
INTRAVENOUS | Status: DC
  Administered 2015-07-12: 14:00:00 via INTRAVENOUS
  Filled 2015-07-12: qty 1000

## 2015-07-12 MED ORDER — HYDROMORPHONE HCL 1 MG/ML IJ SOLN
0.5000 mg | INTRAMUSCULAR | Status: DC | PRN
  Administered 2015-07-12 – 2015-07-13 (×5): 1 mg via INTRAVENOUS
  Filled 2015-07-12 (×4): qty 1

## 2015-07-12 MED ORDER — CEFAZOLIN SODIUM-DEXTROSE 2-3 GM-% IV SOLR
2.0000 g | Freq: Four times a day (QID) | INTRAVENOUS | Status: AC
  Administered 2015-07-12 (×2): 2 g via INTRAVENOUS
  Filled 2015-07-12 (×3): qty 50

## 2015-07-12 MED ORDER — ONDANSETRON HCL 4 MG/2ML IJ SOLN
INTRAMUSCULAR | Status: AC
  Filled 2015-07-12: qty 2

## 2015-07-12 MED ORDER — SODIUM CHLORIDE 0.9 % IJ SOLN
INTRAMUSCULAR | Status: DC | PRN
  Administered 2015-07-12: 40 mL

## 2015-07-12 MED ORDER — METHOCARBAMOL 500 MG PO TABS
500.0000 mg | ORAL_TABLET | Freq: Four times a day (QID) | ORAL | Status: DC | PRN
  Administered 2015-07-12 – 2015-07-14 (×6): 500 mg via ORAL
  Filled 2015-07-12 (×6): qty 1

## 2015-07-12 MED ORDER — LIDOCAINE HCL (CARDIAC) 20 MG/ML IV SOLN
INTRAVENOUS | Status: DC | PRN
  Administered 2015-07-12: 50 mg via INTRAVENOUS

## 2015-07-12 MED ORDER — CHLORHEXIDINE GLUCONATE 4 % EX LIQD
60.0000 mL | Freq: Once | CUTANEOUS | Status: DC
Start: 2015-07-12 — End: 2015-07-12

## 2015-07-12 MED ORDER — MENTHOL 3 MG MT LOZG: 1.0000 | LOZENGE | OROMUCOSAL | Status: DC | PRN

## 2015-07-12 MED ORDER — ZOLPIDEM TARTRATE 5 MG PO TABS: 5.0000 mg | ORAL_TABLET | Freq: Every evening | ORAL | Status: DC | PRN

## 2015-07-12 MED ORDER — HYDROMORPHONE HCL 1 MG/ML IJ SOLN
0.5000 mg | INTRAMUSCULAR | Status: DC | PRN
  Administered 2015-07-12 (×2): 0.5 mg via INTRAVENOUS

## 2015-07-12 MED ORDER — HYDROMORPHONE HCL 1 MG/ML IJ SOLN
INTRAMUSCULAR | Status: AC
  Administered 2015-07-12: 1 mg via INTRAVENOUS
  Filled 2015-07-12: qty 1

## 2015-07-12 MED ORDER — APIXABAN 2.5 MG PO TABS
2.5000 mg | ORAL_TABLET | Freq: Two times a day (BID) | ORAL | Status: DC
  Administered 2015-07-13 – 2015-07-14 (×3): 2.5 mg via ORAL
  Filled 2015-07-12 (×3): qty 1

## 2015-07-12 MED ORDER — PROPOFOL 10 MG/ML IV BOLUS
INTRAVENOUS | Status: AC
  Filled 2015-07-12: qty 20

## 2015-07-12 MED ORDER — ALUM & MAG HYDROXIDE-SIMETH 200-200-20 MG/5ML PO SUSP: 30.0000 mL | ORAL | Status: DC | PRN

## 2015-07-12 MED ORDER — ROCURONIUM BROMIDE 50 MG/5ML IV SOLN
INTRAVENOUS | Status: AC
  Filled 2015-07-12: qty 1

## 2015-07-12 MED ORDER — PHENOL 1.4 % MT LIQD: 1.0000 | OROMUCOSAL | Status: DC | PRN

## 2015-07-12 MED ORDER — LACTATED RINGERS IV SOLN
INTRAVENOUS | Status: DC | PRN
  Administered 2015-07-12: 08:00:00 via INTRAVENOUS

## 2015-07-12 MED ORDER — HYDROMORPHONE HCL 1 MG/ML IJ SOLN
0.2500 mg | INTRAMUSCULAR | Status: DC | PRN
  Administered 2015-07-12 (×4): 0.5 mg via INTRAVENOUS

## 2015-07-12 MED ORDER — EPHEDRINE SULFATE 50 MG/ML IJ SOLN
INTRAMUSCULAR | Status: AC
  Filled 2015-07-12: qty 1

## 2015-07-12 MED ORDER — HYDROMORPHONE HCL 1 MG/ML IJ SOLN
INTRAMUSCULAR | Status: AC
  Administered 2015-07-12: 0.5 mg via INTRAVENOUS
  Filled 2015-07-12: qty 1

## 2015-07-12 MED ORDER — ONDANSETRON HCL 4 MG/2ML IJ SOLN: 4.0000 mg | Freq: Once | INTRAMUSCULAR | Status: DC | PRN

## 2015-07-12 MED ORDER — ONDANSETRON HCL 4 MG PO TABS: 4.0000 mg | ORAL_TABLET | Freq: Four times a day (QID) | ORAL | Status: DC | PRN

## 2015-07-12 MED ORDER — CELECOXIB 200 MG PO CAPS
200.0000 mg | ORAL_CAPSULE | Freq: Two times a day (BID) | ORAL | Status: DC
  Administered 2015-07-12 – 2015-07-14 (×5): 200 mg via ORAL
  Filled 2015-07-12 (×5): qty 1

## 2015-07-12 MED ORDER — SUCCINYLCHOLINE CHLORIDE 20 MG/ML IJ SOLN
INTRAMUSCULAR | Status: AC
  Filled 2015-07-12: qty 1

## 2015-07-12 MED ORDER — OXYCODONE HCL 5 MG/5ML PO SOLN: 5.0000 mg | Freq: Once | ORAL | Status: DC | PRN

## 2015-07-12 MED ORDER — ACETAMINOPHEN 650 MG RE SUPP: 650.0000 mg | Freq: Four times a day (QID) | RECTAL | Status: DC | PRN

## 2015-07-12 MED ORDER — DEXAMETHASONE SODIUM PHOSPHATE 4 MG/ML IJ SOLN
INTRAMUSCULAR | Status: DC | PRN
  Administered 2015-07-12: 4 mg via INTRAVENOUS

## 2015-07-12 MED ORDER — FENTANYL CITRATE (PF) 250 MCG/5ML IJ SOLN
INTRAMUSCULAR | Status: AC
  Filled 2015-07-12: qty 5

## 2015-07-12 MED ORDER — SODIUM CHLORIDE 0.9 % IR SOLN
Status: DC | PRN
  Administered 2015-07-12: 3000 mL

## 2015-07-12 MED ORDER — SENNOSIDES-DOCUSATE SODIUM 8.6-50 MG PO TABS
1.0000 | ORAL_TABLET | Freq: Every evening | ORAL | Status: DC | PRN
  Administered 2015-07-12 – 2015-07-14 (×3): 1 via ORAL
  Filled 2015-07-12 (×3): qty 1

## 2015-07-12 MED ORDER — BISACODYL 5 MG PO TBEC: 5.0000 mg | DELAYED_RELEASE_TABLET | Freq: Every day | ORAL | Status: DC | PRN

## 2015-07-12 MED ORDER — OXYCODONE HCL 5 MG PO TABS
ORAL_TABLET | ORAL | Status: AC
  Filled 2015-07-12: qty 2

## 2015-07-12 MED ORDER — DOCUSATE SODIUM 100 MG PO CAPS
100.0000 mg | ORAL_CAPSULE | Freq: Two times a day (BID) | ORAL | Status: DC
  Administered 2015-07-12 – 2015-07-14 (×5): 100 mg via ORAL
  Filled 2015-07-12 (×5): qty 1

## 2015-07-12 MED ORDER — ONDANSETRON HCL 4 MG/2ML IJ SOLN
4.0000 mg | Freq: Four times a day (QID) | INTRAMUSCULAR | Status: DC | PRN
  Administered 2015-07-12: 4 mg via INTRAVENOUS
  Filled 2015-07-12: qty 2

## 2015-07-12 MED ORDER — PROPOFOL 10 MG/ML IV BOLUS
INTRAVENOUS | Status: DC | PRN
  Administered 2015-07-12: 200 mg via INTRAVENOUS

## 2015-07-12 MED ORDER — METOCLOPRAMIDE HCL 5 MG/ML IJ SOLN
5.0000 mg | Freq: Three times a day (TID) | INTRAMUSCULAR | Status: DC | PRN
Start: 2015-07-12 — End: 2015-07-14

## 2015-07-12 MED ORDER — BISACODYL 10 MG RE SUPP: 10.0000 mg | Freq: Every day | RECTAL | Status: DC | PRN

## 2015-07-12 MED ORDER — LIDOCAINE HCL (CARDIAC) 20 MG/ML IV SOLN
INTRAVENOUS | Status: AC
  Filled 2015-07-12: qty 5

## 2015-07-12 MED ORDER — NEOSTIGMINE METHYLSULFATE 10 MG/10ML IV SOLN
INTRAVENOUS | Status: DC | PRN
  Administered 2015-07-12: 4 mg via INTRAVENOUS

## 2015-07-12 MED ORDER — CHLORHEXIDINE GLUCONATE 4 % EX LIQD: 60.0000 mL | Freq: Once | CUTANEOUS | Status: DC

## 2015-07-12 MED ORDER — ROCURONIUM BROMIDE 100 MG/10ML IV SOLN
INTRAVENOUS | Status: DC | PRN
  Administered 2015-07-12: 50 mg via INTRAVENOUS

## 2015-07-12 MED ORDER — EPHEDRINE SULFATE 50 MG/ML IJ SOLN
INTRAMUSCULAR | Status: DC | PRN
  Administered 2015-07-12: 10 mg via INTRAVENOUS

## 2015-07-12 MED ORDER — MIDAZOLAM HCL 5 MG/5ML IJ SOLN
INTRAMUSCULAR | Status: DC | PRN
  Administered 2015-07-12 (×2): 1 mg via INTRAVENOUS

## 2015-07-12 MED ORDER — APIXABAN 2.5 MG PO TABS: ORAL_TABLET | ORAL | Status: DC

## 2015-07-12 MED ORDER — BUPIVACAINE LIPOSOME 1.3 % IJ SUSP
20.0000 mL | INTRAMUSCULAR | Status: AC
  Administered 2015-07-12: 20 mL
  Filled 2015-07-12: qty 20

## 2015-07-12 MED ORDER — MAGNESIUM CITRATE PO SOLN: 1.0000 | Freq: Once | ORAL | Status: DC | PRN

## 2015-07-12 MED ORDER — ONDANSETRON HCL 4 MG PO TABS: 4.0000 mg | ORAL_TABLET | Freq: Three times a day (TID) | ORAL | Status: DC | PRN

## 2015-07-12 MED ORDER — METOCLOPRAMIDE HCL 5 MG PO TABS
5.0000 mg | ORAL_TABLET | Freq: Three times a day (TID) | ORAL | Status: DC | PRN
Start: 2015-07-12 — End: 2015-07-14

## 2015-07-12 MED ORDER — ONDANSETRON HCL 4 MG/2ML IJ SOLN
INTRAMUSCULAR | Status: DC | PRN
  Administered 2015-07-12: 4 mg via INTRAVENOUS

## 2015-07-12 MED ORDER — MIDAZOLAM HCL 2 MG/2ML IJ SOLN
0.5000 mg | Freq: Once | INTRAMUSCULAR | Status: AC
  Administered 2015-07-12: 2 mg via INTRAVENOUS

## 2015-07-12 MED ORDER — FENTANYL CITRATE (PF) 100 MCG/2ML IJ SOLN
INTRAMUSCULAR | Status: DC | PRN
  Administered 2015-07-12 (×2): 100 ug via INTRAVENOUS
  Administered 2015-07-12: 50 ug via INTRAVENOUS
  Administered 2015-07-12: 25 ug via INTRAVENOUS
  Administered 2015-07-12 (×2): 50 ug via INTRAVENOUS
  Administered 2015-07-12: 25 ug via INTRAVENOUS
  Administered 2015-07-12 (×2): 50 ug via INTRAVENOUS

## 2015-07-12 MED ORDER — ACETAMINOPHEN 325 MG PO TABS
650.0000 mg | ORAL_TABLET | Freq: Four times a day (QID) | ORAL | Status: DC | PRN
Start: 2015-07-12 — End: 2015-07-14
  Administered 2015-07-13 – 2015-07-14 (×3): 650 mg via ORAL
  Filled 2015-07-12 (×3): qty 2

## 2015-07-12 MED ORDER — OXYCODONE HCL 5 MG PO TABS
5.0000 mg | ORAL_TABLET | ORAL | Status: DC | PRN
  Administered 2015-07-12 – 2015-07-14 (×11): 10 mg via ORAL
  Filled 2015-07-12 (×11): qty 2

## 2015-07-12 MED ORDER — BUPIVACAINE HCL 0.5 % IJ SOLN
INTRAMUSCULAR | Status: DC | PRN
Start: 2015-07-12 — End: 2015-07-12
  Administered 2015-07-12: 10 mL

## 2015-07-12 MED ORDER — HYDROMORPHONE HCL 1 MG/ML IJ SOLN
INTRAMUSCULAR | Status: AC
  Administered 2015-07-12: 0.5 mg via INTRAVENOUS
  Filled 2015-07-12: qty 2

## 2015-07-12 MED ORDER — METHOCARBAMOL 1000 MG/10ML IJ SOLN
500.0000 mg | Freq: Four times a day (QID) | INTRAVENOUS | Status: DC | PRN
  Filled 2015-07-12: qty 5

## 2015-07-12 SURGICAL SUPPLY — 68 items
APL SKNCLS STERI-STRIP NONHPOA (GAUZE/BANDAGES/DRESSINGS) ×1
BANDAGE ELASTIC 4 VELCRO ST LF (GAUZE/BANDAGES/DRESSINGS) ×3 IMPLANT
BANDAGE ELASTIC 6 VELCRO ST LF (GAUZE/BANDAGES/DRESSINGS) ×3 IMPLANT
BANDAGE ESMARK 6X9 LF (GAUZE/BANDAGES/DRESSINGS) ×1 IMPLANT
BENZOIN TINCTURE PRP APPL 2/3 (GAUZE/BANDAGES/DRESSINGS) ×3 IMPLANT
BLADE SAG 18X100X1.27 (BLADE) ×6 IMPLANT
BNDG CMPR 9X6 STRL LF SNTH (GAUZE/BANDAGES/DRESSINGS) ×1
BNDG ESMARK 6X9 LF (GAUZE/BANDAGES/DRESSINGS) ×3
BOWL SMART MIX CTS (DISPOSABLE) ×3 IMPLANT
CAPT KNEE TOTAL 3 ×2 IMPLANT
CEMENT BONE SIMPLEX SPEEDSET (Cement) ×6 IMPLANT
CLOSURE WOUND 1/2 X4 (GAUZE/BANDAGES/DRESSINGS) ×2
COVER SURGICAL LIGHT HANDLE (MISCELLANEOUS) ×3 IMPLANT
CUFF TOURNIQUET SINGLE 34IN LL (TOURNIQUET CUFF) ×3 IMPLANT
DRAPE EXTREMITY T 121X128X90 (DRAPE) ×3 IMPLANT
DRAPE IMP U-DRAPE 54X76 (DRAPES) ×3 IMPLANT
DRAPE PROXIMA HALF (DRAPES) ×3 IMPLANT
DRAPE U-SHAPE 47X51 STRL (DRAPES) ×3 IMPLANT
DRSG PAD ABDOMINAL 8X10 ST (GAUZE/BANDAGES/DRESSINGS) ×3 IMPLANT
DURAPREP 26ML APPLICATOR (WOUND CARE) ×6 IMPLANT
ELECT CAUTERY BLADE 6.4 (BLADE) ×3 IMPLANT
ELECT REM PT RETURN 9FT ADLT (ELECTROSURGICAL) ×3
ELECTRODE REM PT RTRN 9FT ADLT (ELECTROSURGICAL) ×1 IMPLANT
EVACUATOR 1/8 PVC DRAIN (DRAIN) ×3 IMPLANT
FACESHIELD WRAPAROUND (MASK) ×3 IMPLANT
FACESHIELD WRAPAROUND OR TEAM (MASK) ×2 IMPLANT
GAUZE SPONGE 4X4 12PLY STRL (GAUZE/BANDAGES/DRESSINGS) ×3 IMPLANT
GLOVE BIOGEL PI IND STRL 7.0 (GLOVE) ×1 IMPLANT
GLOVE BIOGEL PI INDICATOR 7.0 (GLOVE) ×2
GLOVE ORTHO TXT STRL SZ7.5 (GLOVE) ×3 IMPLANT
GLOVE SURG ORTHO 7.0 STRL STRW (GLOVE) ×3 IMPLANT
GOWN STRL REUS W/ TWL LRG LVL3 (GOWN DISPOSABLE) ×2 IMPLANT
GOWN STRL REUS W/ TWL XL LVL3 (GOWN DISPOSABLE) ×1 IMPLANT
GOWN STRL REUS W/TWL LRG LVL3 (GOWN DISPOSABLE) ×6
GOWN STRL REUS W/TWL XL LVL3 (GOWN DISPOSABLE) ×3
HANDPIECE INTERPULSE COAX TIP (DISPOSABLE) ×3
IMMOBILIZER KNEE 22 UNIV (SOFTGOODS) ×3 IMPLANT
IMMOBILIZER KNEE 24 THIGH 36 (MISCELLANEOUS) IMPLANT
IMMOBILIZER KNEE 24 UNIV (MISCELLANEOUS)
KIT BASIN OR (CUSTOM PROCEDURE TRAY) ×3 IMPLANT
KIT ROOM TURNOVER OR (KITS) ×3 IMPLANT
MANIFOLD NEPTUNE II (INSTRUMENTS) ×3 IMPLANT
NDL 18GX1X1/2 (RX/OR ONLY) (NEEDLE) ×1 IMPLANT
NDL HYPO 25GX1X1/2 BEV (NEEDLE) ×1 IMPLANT
NEEDLE 18GX1X1/2 (RX/OR ONLY) (NEEDLE) ×3 IMPLANT
NEEDLE HYPO 25GX1X1/2 BEV (NEEDLE) ×3 IMPLANT
NS IRRIG 1000ML POUR BTL (IV SOLUTION) ×3 IMPLANT
PACK TOTAL JOINT (CUSTOM PROCEDURE TRAY) ×3 IMPLANT
PACK UNIVERSAL I (CUSTOM PROCEDURE TRAY) ×1 IMPLANT
PAD ARMBOARD 7.5X6 YLW CONV (MISCELLANEOUS) ×6 IMPLANT
PAD CAST 4YDX4 CTTN HI CHSV (CAST SUPPLIES) ×1 IMPLANT
PADDING CAST COTTON 4X4 STRL (CAST SUPPLIES) ×6
SET HNDPC FAN SPRY TIP SCT (DISPOSABLE) ×1 IMPLANT
STRIP CLOSURE SKIN 1/2X4 (GAUZE/BANDAGES/DRESSINGS) ×4 IMPLANT
SUCTION FRAZIER HANDLE 10FR (MISCELLANEOUS) ×2
SUCTION TUBE FRAZIER 10FR DISP (MISCELLANEOUS) ×1 IMPLANT
SUT MNCRL AB 4-0 PS2 18 (SUTURE) ×3 IMPLANT
SUT VIC AB 0 CT1 27 (SUTURE) ×6
SUT VIC AB 0 CT1 27XBRD ANBCTR (SUTURE) IMPLANT
SUT VIC AB 1 CTX 36 (SUTURE) ×6
SUT VIC AB 1 CTX36XBRD ANBCTR (SUTURE) ×1 IMPLANT
SUT VIC AB 2-0 CT1 27 (SUTURE) ×6
SUT VIC AB 2-0 CT1 TAPERPNT 27 (SUTURE) ×2 IMPLANT
SYR 50ML LL SCALE MARK (SYRINGE) ×3 IMPLANT
SYR CONTROL 10ML LL (SYRINGE) ×3 IMPLANT
TOWEL OR 17X24 6PK STRL BLUE (TOWEL DISPOSABLE) ×3 IMPLANT
TOWEL OR 17X26 10 PK STRL BLUE (TOWEL DISPOSABLE) ×3 IMPLANT
WATER STERILE IRR 1000ML POUR (IV SOLUTION) ×1 IMPLANT

## 2015-07-12 NOTE — Evaluation (Signed)
Physical Therapy Evaluation Patient Details Name: Anne Brewer MRN: [id number] DOB: April 05, 1971 Today's Date: 07/12/2015   History of Present Illness  Pt presents for right TKA. PMH: OA and lap gastric sleeve  Clinical Impression  Pt admitted with above diagnosis. Pt currently with functional limitations due to the deficits listed below (see PT Problem List). Pt dizzy with sitting and standing today so mobility limited to bed to chair transfer and education on proper positioning and exercises. Discharge plan poses a challenge since pt does not feel that her boyfriend will safely take care of her but she cannot relay anywhere else she could go either.  Pt will benefit from skilled PT to increase their independence and safety with mobility to allow discharge to the venue listed below.       Follow Up Recommendations Home health PT;Supervision - Intermittent    Equipment Recommendations  Rolling walker with 5" wheels;3in1 (PT)    Recommendations for Other Services OT consult     Precautions / Restrictions Precautions Precautions: Knee Precaution Booklet Issued: No Precaution Comments: reviewed proper positioning. Pt tends to externally rotate right hip, educated her on keeping hip in midline with lateral support of pillows or bed rail Restrictions Weight Bearing Restrictions: Yes RLE Weight Bearing: Weight bearing as tolerated      Mobility  Bed Mobility Overal bed mobility: Needs Assistance Bed Mobility: Supine to Sit     Supine to sit: Min assist     General bed mobility comments: min A to RLE as pt pivoted to EOB  Transfers Overall transfer level: Needs assistance Equipment used: Rolling walker (2 wheeled) Transfers: Sit to/from [facility] Sit to Stand: Min assist Stand pivot transfers: Min assist       General transfer comment: min A for support as pt was dizzy, vc's for hand placement with RW  Ambulation/Gait             General Gait  Details: NT due to dizziness  Stairs            Wheelchair Mobility    Modified Rankin (Stroke Patients Only)       Balance Overall balance assessment: No apparent balance deficits (not formally assessed)                                           Pertinent Vitals/Pain Pain Assessment: Faces Faces Pain Scale: Hurts even more Pain Location: left knee with mvmt Pain Descriptors / Indicators: Aching Pain Intervention(s): Limited activity within patient's tolerance;Monitored during session;Repositioned;Premedicated before session         O2 sats 97% on RA  Home Living Family/patient expects to be discharged to:: Private residence Living Arrangements: Spouse/significant other Available Help at Discharge: Friend(s);Available PRN/intermittently Type of Home: Apartment Home Access: Stairs to enter Entrance Stairs-Rails: None Entrance Stairs-Number of Steps: 1 Home Layout: One level Home Equipment: None Additional Comments: pt reports that she lives with her boyfriend who is drug addict and is cheating on her. She is very preoccupied with that situation. She wants to get out of this situation but reports that she will go home with him in the short term to recover and will then lead. Appreciate SW assistance    Prior Function Level of Independence: Independent         Comments: pt is a Ship broker in [employer]  Extremity/Trunk Assessment   Upper Extremity Assessment: Overall WFL for tasks assessed           Lower Extremity Assessment: RLE deficits/detail;LLE deficits/detail RLE Deficits / Details: hip flex 2/5, knee flex/ ext 2/5, ankle WFL LLE Deficits / Details: WFL  Cervical / Trunk Assessment: Normal  Communication   Communication: No difficulties  Cognition Arousal/Alertness: Lethargic;Suspect due to medications Behavior During Therapy: [facility] for tasks assessed/performed Overall Cognitive Status: Within Functional  Limits for tasks assessed                      General Comments      Exercises Total Joint Exercises Ankle Circles/Pumps: AROM;Both;15 reps;Supine Long Arc Quad: AROM;Right;10 reps;Seated      Assessment/Plan    PT Assessment Patient needs continued PT services  PT Diagnosis Difficulty walking;Abnormality of gait;Acute pain   PT Problem List Decreased strength;Decreased range of motion;Decreased activity tolerance;Decreased mobility;Decreased knowledge of use of DME;Decreased knowledge of precautions;Pain  PT Treatment Interventions DME instruction;Gait training;Stair training;Functional mobility training;Therapeutic activities;Therapeutic exercise;Patient/family education   PT Goals (Current goals can be found in the Care Plan section) Acute Rehab PT Goals Patient Stated Goal: get myself better PT Goal Formulation: With patient Time For Goal Achievement: 07/19/15 Potential to Achieve Goals: Good    Frequency 7X/week   Barriers to discharge Decreased caregiver support unsure of safety of discharge situation    Co-evaluation               End of Session Equipment Utilized During Treatment: Gait belt;Oxygen Activity Tolerance: Other (comment) (dizziness) Patient left: in chair;with call bell/phone within reach Nurse Communication: Mobility status;Other (comment) (dizziness)         Time: [id number] PT Time Calculation (min) (ACUTE ONLY): 24 min   Charges:   PT Evaluation $PT Eval Moderate Complexity: 1 Procedure PT Treatments $Therapeutic Activity: 8-22 mins   PT G Codes:      Leighton Roach, PT  Acute Rehab Services  [place], [place] 07/12/2015, 4:11 PM

## 2015-07-12 NOTE — Anesthesia Postprocedure Evaluation (Signed)
Anesthesia Post Note  Patient: Anne Brewer  Procedure(s) Performed: Procedure(s) (LRB): TOTAL KNEE ARTHROPLASTY (Right)  Patient location during evaluation: PACU Anesthesia Type: General Level of consciousness: awake and awake and alert Pain management: pain level controlled Vital Signs Assessment: post-procedure vital signs reviewed and stable Respiratory status: spontaneous breathing, nonlabored ventilation and respiratory function stable Anesthetic complications: no    Last Vitals:  Filed Vitals:   07/12/15 1100 07/12/15 1115  BP: 114/64   Pulse: 57 54  Temp:    Resp: 15 15    Last Pain:  Filed Vitals:   07/12/15 1120  PainSc: Asleep                 Darcel Frane COKER

## 2015-07-12 NOTE — H&P (View-Only) (Signed)
TOTAL KNEE ADMISSION H&P  Patient is being admitted for right total knee arthroplasty.  Subjective:  Chief Complaint:right knee pain.  HPI: Anne Brewer, 45 y.o. female, has a history of pain and functional disability in the right knee due to arthritis and has failed non-surgical conservative treatments for greater than 12 weeks to includeNSAID's and/or analgesics and corticosteriod injections.  Onset of symptoms was abrupt, starting 1 years ago with rapidlly worsening course since that time. The patient noted no past surgery on the right knee(s).  Patient currently rates pain in the right knee(s) at 10 out of 10 with activity. Patient has night pain, worsening of pain with activity and weight bearing, pain that interferes with activities of daily living, pain with passive range of motion, crepitus and joint swelling.  Patient has evidence of subchondral sclerosis and joint space narrowing by imaging studies. There is no active infection.  There are no active problems to display for this patient.  No past medical history on file.  Past Surgical History  Procedure Laterality Date  . Abdominal surgery    . Cesarean section    . Cholecystectomy    . Laparoscopic gastric sleeve resection       (Not in a hospital admission) Allergies  Allergen Reactions  . Shellfish Allergy Anaphylaxis, Hives and Swelling    Social History  Substance Use Topics  . Smoking status: Current Every Day Smoker -- 0.30 packs/day    Types: Cigarettes  . Smokeless tobacco: Not on file  . Alcohol Use: No    No family history on file.   Review of Systems  Constitutional: Negative.   HENT: Negative.   Eyes: Negative.   Respiratory: Negative.   Cardiovascular: Negative.   Gastrointestinal: Negative.   Genitourinary: Negative.   Musculoskeletal: Positive for joint pain.  Skin: Negative.   Neurological: Negative.   Endo/Heme/Allergies: Negative.   Psychiatric/Behavioral: The patient is nervous/anxious.      Objective:  Physical Exam  Constitutional: She is oriented to person, place, and time. She appears well-developed and well-nourished.  HENT:  Head: Normocephalic and atraumatic.  Eyes: EOM are normal. Pupils are equal, round, and reactive to light.  Neck: Normal range of motion. Neck supple.  Cardiovascular: Normal rate and regular rhythm.   Respiratory: Effort normal and breath sounds normal.  GI: Soft. Bowel sounds are normal.  Musculoskeletal:  range of motion 0-90 degrees.  Minimal patellofemoral crepitus.  Marked lateral joint line tenderness.  She is neurovascularly intact distally.   Neurological: She is alert and oriented to person, place, and time.  Skin: Skin is warm and dry.  Psychiatric: She has a normal mood and affect. Her behavior is normal. Judgment and thought content normal.    Vital signs in last 24 hours: @VSRANGES @  Labs:   Estimated body mass index is 30.87 kg/(m^2) as calculated from the following:   Height as of 01/12/15: 5\' 8"  (1.727 m).   Weight as of 01/12/15: 92.08 kg (203 lb).   Imaging Review Plain radiographs demonstrate severe degenerative joint disease of the right knee(s). The overall alignment isneutral. The bone quality appears to be fair for age and reported activity level.  Assessment/Plan:  End stage arthritis, right knee   The patient history, physical examination, clinical judgment of the provider and imaging studies are consistent with end stage degenerative joint disease of the right knee(s) and total knee arthroplasty is deemed medically necessary. The treatment options including medical management, injection therapy arthroscopy and arthroplasty were discussed at  length. The risks and benefits of total knee arthroplasty were presented and reviewed. The risks due to aseptic loosening, infection, stiffness, patella tracking problems, thromboembolic complications and other imponderables were discussed. The patient acknowledged the  explanation, agreed to proceed with the plan and consent was signed. Patient is being admitted for inpatient treatment for surgery, pain control, PT, OT, prophylactic antibiotics, VTE prophylaxis, progressive ambulation and ADL's and discharge planning. The patient is planning to be discharged to skilled nursing facility

## 2015-07-12 NOTE — Care Management (Signed)
Utilization review completed. Danish Ruffins, RN Case Manager [phone]. 

## 2015-07-12 NOTE — Discharge Summary (Addendum)
Patient ID: Anne Brewer MRN: [id number] DOB/AGE: 1970-07-08 45 y.o.  Admit date: 07/12/2015 Discharge date: 07/14/2015  Admission Diagnoses:  Active Problems:   S/P total knee replacement   Discharge Diagnoses:  Same  Past Medical History  Diagnosis Date  . Arthritis   . Anemia     Surgeries: Procedure(s): TOTAL KNEE ARTHROPLASTY on 07/12/2015   Consultants:    Discharged Condition: Improved  Hospital Course: Anne Brewer is an 45 y.o. female who was admitted 07/12/2015 for operative treatment of primary localized osteoarthritis right knee. Patient has severe unremitting pain that affects sleep, daily activities, and work/hobbies. After pre-op clearance the patient was taken to the operating room on 07/12/2015 and underwent  Procedure(s): TOTAL KNEE ARTHROPLASTY.  Patient with a pre-op Hb 10.6 developed ABLA on pod #1 with a Hb of 9.5.  She is currently stable but we will continue to follow.  Patient was given perioperative antibiotics:      Anti-infectives    Start     Dose/Rate Route Frequency Ordered Stop   07/12/15 1400  ceFAZolin (ANCEF) IVPB 2 g/50 mL premix     2 g 100 mL/hr over 30 Minutes Intravenous Every 6 hours 07/12/15 1307 07/12/15 2201   07/12/15 0800  ceFAZolin (ANCEF) IVPB 2 g/50 mL premix     2 g 100 mL/hr over 30 Minutes Intravenous To ShortStay Surgical 07/11/15 1139 07/12/15 0910       Patient was given sequential compression devices, early ambulation, and chemoprophylaxis to prevent DVT.  Patient benefited maximally from hospital stay and there were no complications.    Recent vital signs:  Patient Vitals for the past 24 hrs:  BP Temp Temp src Pulse Resp SpO2  07/13/15 2048 (!) 114/52 mmHg 98.4 F (36.9 C) Oral 63 16 100 %  07/13/15 1300 110/76 mmHg 98.9 F (37.2 C) Oral 65 18 100 %     Recent laboratory studies:   Recent Labs  07/13/15 0611  WBC 8.4  HGB 9.5*  HCT 29.6*  PLT 269  NA 134*  K 3.5  CL 103  CO2 23  BUN 5*   CREATININE 0.70  GLUCOSE 138*  CALCIUM 8.6*     Discharge Medications:     Medication List    STOP taking these medications        acetaminophen 650 MG CR tablet  Commonly known as:  TYLENOL     diclofenac 75 MG EC tablet  Commonly known as:  VOLTAREN     HYDROcodone-acetaminophen 5-325 MG tablet  Commonly known as:  NORCO/VICODIN      TAKE these medications        apixaban 2.5 MG Tabs tablet  Commonly known as:  ELIQUIS  Take 1 tab po q12 hours x 14 days following surgery to prevent blood clots     BIOTIN PO  Take 2 tablets by mouth daily.     bisacodyl 5 MG EC tablet  Commonly known as:  DULCOLAX  Take 1 tablet (5 mg total) by mouth daily as needed for moderate constipation.     hydrOXYzine 25 MG tablet  Commonly known as:  ATARAX/VISTARIL  Take 1 tablet (25 mg total) by mouth every 6 (six) hours as needed for itching.     methocarbamol 500 MG tablet  Commonly known as:  ROBAXIN  Take 1 tablet (500 mg total) by mouth 4 (four) times daily.     MULTI COMPLETE PO  Take by mouth daily.     ondansetron  4 MG tablet  Commonly known as:  ZOFRAN  Take 1 tablet (4 mg total) by mouth every 8 (eight) hours as needed for nausea or vomiting.     oxyCODONE-acetaminophen 5-325 MG tablet  Commonly known as:  ROXICET  Take 1-2 tablets by mouth every 4 (four) hours as needed.     vitamin B-12 1000 MCG tablet  Commonly known as:  CYANOCOBALAMIN  Take 1,000 mcg by mouth 2 (two) times daily.        Diagnostic Studies: Dg Knee Right Port  07/12/2015  CLINICAL DATA:  45 year old female with a history of right knee pain. Status post right knee replacement EXAM: PORTABLE RIGHT KNEE - 1-2 VIEW COMPARISON:  01/12/2015 FINDINGS: Surgical changes of right knee arthroplasty. Gas within the surgical bed on anterior and lateral view. No surgical drain visualized. Soft tissue swelling present. Joint appears congruent.  No fracture identified. IMPRESSION: Interval changes of right knee  arthroplasty, with gas in the surgical bed and soft tissue swelling. No perihardware fracture identified. Signed, Dulcy Fanny. Earleen Newport, DO [facility] Electronically Signed   By: Corrie Mckusick D.O.   On: 07/12/2015 11:19    Disposition: 01-Home or Self Care    Follow-up Information    Follow up with [name], MD. Schedule an appointment as soon as possible for a visit in 2 weeks.   Specialty:  Orthopedic Surgery   Contact information:   [address] [place] [address] [phone]        Signed: Fannie Knee 07/14/2015, 7:42 AM

## 2015-07-12 NOTE — Op Note (Signed)
NAME:  Anne Brewer, Anne Brewer NO.:  [id number]  MEDICAL RECORD NO.:  [id number]  LOCATION:  [facility]                         FACILITY:  [facility]  PHYSICIAN:  [name], M.D. DATE OF BIRTH:  10-06-1970  DATE OF PROCEDURE:  07/12/2015 DATE OF DISCHARGE:                              OPERATIVE REPORT   PREOPERATIVE DIAGNOSIS:  Right knee end-stage arthritis, primary localized.  Marked valgus with bone loss, primarily distal lateral femur.  POSTOPERATIVE DIAGNOSIS:  Right knee end-stage arthritis, primary localized.  Marked valgus with bone loss, primarily distal lateral femur.  PROCEDURE:  Right knee modified minimally invasive total knee replacement Stryker Triathlon prosthesis.  A cemented pegged cruciate retaining #4 femoral component.  Cemented #5 tibial component, 9 mm CS insert.  Cemented resurfacing 35-mm patellar component.  SURGEON:  [name], MD  ASSISTANT:  Elmyra Ricks, PA, present throughout the entire case and necessary for timely completion of procedure.  ANESTHESIA:  General.  BLOOD LOSS:  Minimal.  SPECIMENS:  None.  COUNTS:  None.  COMPLICATIONS:  None.  DRESSINGS:  Soft compressive knee immobilizer.  TOURNIQUET TIME:  55 minutes.  PROCEDURE IN DETAIL:  The patient was brought to operating room, placed on the operating table in supine position.  After adequate anesthesia had been obtained, tourniquet applied, prepped and draped in usual sterile fashion.  Exsanguinated with elevation of Esmarch.  Tourniquet inflated to 350 mmHg.  Excessive valgus, but this was correctable. Anterior approach, vertical incision above the patella down to tibial tubercle.  Medial arthrotomy, vastus splitting, preserving quad tendon. Knee exposed.  Significant bone loss, lateral distal femur.  Little bit on the tibia.  Intramedullary guide.  Flexible rod, distal femur.  8 mm resection 5 degrees of valgus.  Using epicondylar axis, femur was  sized, cut, and fitted for a cruciate retaining #4 component.  Extramedullary guide, proximal tibial resection.  A 3-degree posterior slope cut. Sized for #5 component.  Patella exposed, posterior 10 mm was removed. Drilled, sized, and fitted for a 35-mm component.  Debris cleared throughout.  Trials put in place.  9-mm CS insert.  With this, I had a nicely balanced knee, full motion, good stability, good patellar tracking.  Tibia was marked for rotation and hand reamed.  All trials removed.  Copious irrigation with pulse irrigating device.  Cement prepared, placed on all components, firmly seated.  Polyethylene attached to tibia, knee reduced.  Patella held with a clamp.  Once the cement had hardened, the knee was irrigated again.  Soft tissue was injected with Exparel.  Arthrotomy closed with #1 Vicryl in the subcutaneous subcuticular closure.  Margins were injected with Marcaine. Sterile compressive dressing applied.  Tourniquet deflated, removed. Knee immobilizer applied.  Anesthesia reversed.  Brought to the recovery room.  Tolerated the surgery well, no complications.     [name], M.D.     DFM/MEDQ  D:  07/12/2015  T:  07/12/2015  Job:  [id number]

## 2015-07-12 NOTE — Anesthesia Procedure Notes (Signed)
Procedure Name: Intubation Date/Time: 07/12/2015 8:51 AM Performed by: Adalberto Ill Pre-anesthesia Checklist: Patient identified, Emergency Drugs available, Suction available, Patient being monitored and Timeout performed Patient Re-evaluated:Patient Re-evaluated prior to inductionOxygen Delivery Method: Circle system utilized Preoxygenation: Pre-oxygenation with 100% oxygen Intubation Type: IV induction Ventilation: Mask ventilation without difficulty Laryngoscope Size: Miller and 2 Grade View: Grade I Tube type: Oral Tube size: 7.5 mm Number of attempts: 1 Placement Confirmation: ETT inserted through vocal cords under direct vision,  positive ETCO2 and CO2 detector Secured at: 22 cm Tube secured with: Tape Dental Injury: Teeth and Oropharynx as per pre-operative assessment

## 2015-07-12 NOTE — Transfer of Care (Signed)
Immediate Anesthesia Transfer of Care Note  Patient: Anne Brewer  Procedure(s) Performed: Procedure(s): TOTAL KNEE ARTHROPLASTY (Right)  Patient Location: PACU  Anesthesia Type:General  Level of Consciousness: awake, alert , oriented and patient cooperative  Airway & Oxygen Therapy: Patient Spontanous Breathing and Patient connected to face mask oxygen  Post-op Assessment: Report given to RN and Post -op Vital signs reviewed and stable  Post vital signs: Reviewed and stable  Last Vitals:  Filed Vitals:   07/12/15 0649 07/12/15 0730  BP: 119/47 112/62  Pulse: 56 57  Temp:  123456 C    Complications: No apparent anesthesia complications

## 2015-07-12 NOTE — Evaluation (Signed)
Occupational Therapy Evaluation Patient Details Name: Anne Brewer MRN: [id number] DOB: 1970-07-17 Today's Date: 07/12/2015    History of Present Illness Pt presents for right TKA. PMH: OA and lap gastric sleeve   Clinical Impression   Pt reports she was independent with ADLs PTA. Currently pt is overall min assist with ADLs and sit to stand transfers. OT eval limited by pt with dizziness; increased upon standing requiring pt to need to sit down. Began safety and ADL education with pt. Recommending short term SNF (pt requesting [facility] if possible) for follow up in order to maximize independence and safety with ADLs and functional mobility prior to return home. Pt would benefit from continued skilled OT to address established goals.    Follow Up Recommendations  SNF;Supervision - Intermittent    Equipment Recommendations  3 in 1 bedside comode    Recommendations for Other Services       Precautions / Restrictions Precautions Precautions: Knee Precaution Booklet Issued: No Precaution Comments: reviewed proper positioning. Pt tends to externally rotate right hip, educated her on keeping hip in midline with lateral support of pillows or bed rail Restrictions Weight Bearing Restrictions: Yes RLE Weight Bearing: Weight bearing as tolerated      Mobility Bed Mobility      General bed mobility comments: Pt OOB in chair upon arrival  Transfers Overall transfer level: Needs assistance Equipment used: Rolling walker (2 wheeled) Transfers: Sit to/from Stand Sit to Stand: Min assist Stand pivot transfers: Min assist       General transfer comment: Min assist to boost up and for balance in standing as pt reports dizziness.    Balance Overall balance assessment: No apparent balance deficits (not formally assessed)                                          ADL Overall ADL's : Needs assistance/impaired Eating/Feeding: Set up;Sitting   Grooming: Set  up;Sitting   Upper Body Bathing: Set up;Supervision/ safety;Sitting   Lower Body Bathing: Minimal assistance;Sit to/from stand   Upper Body Dressing : Set up;Supervision/safety;Sitting   Lower Body Dressing: Minimal assistance;Sit to/from stand Lower Body Dressing Details (indicate cue type and reason): Pt able to pull up L sock but difficulty reaching R foot. Educated on compensatory strategies for LB ADLs.               General ADL Comments: Pt currenlty min assist for sit to stand. Pt report dizziness in sitting and increased dizziness in standing limiting functional mobility assessment at this time. Pt reports she would like to d/c to [facility] if possible.     Vision Vision Assessment?: No apparent visual deficits   Perception     Praxis      Pertinent Vitals/Pain Pain Assessment: 0-10 Pain Score: 6  Faces Pain Scale: Hurts even more Pain Location: L knee Pain Descriptors / Indicators: Aching;Guarding;Grimacing Pain Intervention(s): Limited activity within patient's tolerance;Monitored during session;Repositioned     Hand Dominance     Extremity/Trunk Assessment Upper Extremity Assessment Upper Extremity Assessment: Overall WFL for tasks assessed      Cervical / Trunk Assessment Cervical / Trunk Assessment: Normal   Communication Communication Communication: No difficulties   Cognition Arousal/Alertness: Lethargic;Suspect due to medications Behavior During Therapy: [facility] for tasks assessed/performed Overall Cognitive Status: Within Functional Limits for tasks assessed  General Comments       Exercises Exercises: Total Joint     Shoulder Instructions      Home Living Family/patient expects to be discharged to:: Unsure Living Arrangements: Spouse/significant other Available Help at Discharge: Friend(s);Available PRN/intermittently Type of Home: Apartment Home Access: Stairs to enter Entrance Stairs-Number of Steps:  1 Entrance Stairs-Rails: None Home Layout: One level     Bathroom Shower/Tub: Tub/shower unit Shower/tub characteristics: Architectural technologist: Handicapped height Bathroom Accessibility: Yes How Accessible: Accessible via walker Home Equipment: None   Additional Comments: pt reports that she lives with her boyfriend who is drug addict and is cheating on her. She is very preoccupied with that situation. She wants to get out of this situation but reports that she will go home with him in the short term to recover and will then lead. Appreciate SW assistance. Pt reports that she toured [facility] prior to [facility] [employer] and would be agreeable to SNF placement at d/c      Prior Functioning/Environment Level of Independence: Independent        Comments: pt is a Ship broker in criminology    OT Diagnosis: Generalized weakness;Acute pain   OT Problem List: Decreased strength;Decreased activity tolerance;Impaired balance (sitting and/or standing);Decreased safety awareness;Decreased knowledge of use of DME or AE;Decreased knowledge of precautions;Obesity;Pain   OT Treatment/Interventions: Self-care/ADL training;Energy conservation;DME and/or AE instruction;Therapeutic activities;Patient/family education;Balance training    OT Goals(Current goals can be found in the care plan section) Acute Rehab OT Goals Patient Stated Goal: get myself better OT Goal Formulation: With patient Time For Goal Achievement: 07/26/15 Potential to Achieve Goals: Good ADL Goals Pt Will Perform Grooming: with supervision;standing Pt Will Perform Lower Body Bathing: with supervision;sit to/from stand Pt Will Perform Lower Body Dressing: with supervision;sit to/from stand Pt Will Transfer to Toilet: with supervision;ambulating;bedside commode (over toilet) Pt Will Perform Toileting - Clothing Manipulation and hygiene: with supervision;sit to/from stand Pt Will Perform Tub/Shower Transfer: Tub transfer;with  supervision;ambulating;3 in 1;rolling walker  OT Frequency: Min 2X/week   Barriers to D/C: Decreased caregiver support          Co-evaluation              End of Session Equipment Utilized During Treatment: Gait belt;Rolling walker CPM Right Knee CPM Right Knee: Off Nurse Communication: Other (comment) (RN tech notified pt requesting bath)  Activity Tolerance: Other (comment) (Limited by dizziness) Patient left: in chair;with call bell/phone within reach;Other (comment) (with pharmacy )   [id number] OT Time Calculation (min): 12 min Charges:  OT General Charges $OT Visit: 1 Procedure OT Evaluation $OT Eval Moderate Complexity: 1 Procedure G-Codes:     Binnie Kand M.S., OTR/L Pager: [phone]  07/12/2015, 4:46 PM

## 2015-07-12 NOTE — Interval H&P Note (Signed)
History and Physical Interval Note:  07/12/2015 8:28 AM  Anne Brewer  has presented today for surgery, with the diagnosis of djd right knee  The various methods of treatment have been discussed with the patient and family. After consideration of risks, benefits and other options for treatment, the patient has consented to  Procedure(s): TOTAL KNEE ARTHROPLASTY (Right) as a surgical intervention .  The patient's history has been reviewed, patient examined, no change in status, stable for surgery.  I have reviewed the patient's chart and labs.  Questions were answered to the patient's satisfaction.     [name]

## 2015-07-12 NOTE — Progress Notes (Signed)
Orthopedic Tech Progress Note Patient Details:  Anne Brewer 02-05-1971 [id number]  CPM Right Knee CPM Right Knee: On Right Knee Flexion (Degrees): 90 Right Knee Extension (Degrees): 0 Additional Comments: trapeze bar patient helper viewed order from doctor's order list  [name] 07/12/2015, 11:36 AM

## 2015-07-12 NOTE — Progress Notes (Signed)
Upon arrival to short stay department, patient requested to speak with nurse in private.  Patient informed nurse that she only wants family visitors post operatively and informed nurse that she does not trust the person that she living with.  Nurse offered patient a social work consult and patient refused.  Nurse offered to give patient telephone numbers that she could call for help if needed but patient refused and stated she had people that she could call.

## 2015-07-12 NOTE — Anesthesia Preprocedure Evaluation (Signed)
Anesthesia Evaluation  Patient identified by MRN, date of birth, ID band Patient awake    Reviewed: Allergy & Precautions, NPO status , Patient's Chart, lab work & pertinent test results  Airway Mallampati: II  TM Distance: >3 FB Neck ROM: Full    Dental  (+) Teeth Intact, Dental Advisory Given   Pulmonary Current Smoker,    breath sounds clear to auscultation       Cardiovascular  Rhythm:Regular Rate:Normal     Neuro/Psych    GI/Hepatic   Endo/Other    Renal/GU      Musculoskeletal   Abdominal (+) + obese,   Peds  Hematology   Anesthesia Other Findings   Reproductive/Obstetrics                             Anesthesia Physical Anesthesia Plan  ASA: III  Anesthesia Plan: General   Post-op Pain Management:    Induction: Intravenous  Airway Management Planned: Oral ETT  Additional Equipment:   Intra-op Plan:   Post-operative Plan: Extubation in OR  Informed Consent: I have reviewed the patients History and Physical, chart, labs and discussed the procedure including the risks, benefits and alternatives for the proposed anesthesia with the patient or authorized representative who has indicated his/her understanding and acceptance.   Dental advisory given  Plan Discussed with: CRNA and Anesthesiologist  Anesthesia Plan Comments:         Anesthesia Quick Evaluation

## 2015-07-12 NOTE — Progress Notes (Signed)
Orthopedic Tech Progress Note Patient Details:  Anne Brewer February 07, 1971 [id number] Ortho visit put on cpm at [facility] Patient ID: [name], female   DOB: May 10, 1970, 45 y.o.   MRN: [id number]   Braulio Bosch 07/12/2015, 6:34 PM

## 2015-07-12 NOTE — Discharge Instructions (Signed)
INSTRUCTIONS AFTER JOINT REPLACEMENT   o Remove items at home which could result in a fall. This includes throw rugs or furniture in walking pathways o ICE to the affected joint every three hours while awake for 30 minutes at a time, for at least the first 3-5 days, and then as needed for pain and swelling.  Continue to use ice for pain and swelling. You may notice swelling that will progress down to the foot and ankle.  This is normal after surgery.  Elevate your leg when you are not up walking on it.   o Continue to use the breathing machine you got in the hospital (incentive spirometer) which will help keep your temperature down.  It is common for your temperature to cycle up and down following surgery, especially at night when you are not up moving around and exerting yourself.  The breathing machine keeps your lungs expanded and your temperature down.  BLOOD THINNER: TAKE ELIQUIS AS DIRECTED FOR THE FIRST TWO WEEKS FOLLOWING SURGERY.  ONCE FINISHED WITH THIS, TAKE ASPIRIN 325 MG ONE TAB ONCE DAILY FOR THE NEXT TWO WEEKS.   DIET:  As you were doing prior to hospitalization, we recommend a well-balanced diet.  DRESSING / WOUND CARE / SHOWERING  Keep the surgical dressing until follow up.  The dressing is water proof, so you can shower without any extra covering.  IF THE DRESSING FALLS OFF or the wound gets wet inside, change the dressing with sterile gauze.  Please use good hand washing techniques before changing the dressing.  Do not use any lotions or creams on the incision until instructed by your surgeon.    ACTIVITY  o Increase activity slowly as tolerated, but follow the weight bearing instructions below.   o No driving for 6 weeks or until further direction given by your physician.  You cannot drive while taking narcotics.  o No lifting or carrying greater than 10 lbs. until further directed by your surgeon. o Avoid periods of inactivity such as sitting longer than an hour when not  asleep. This helps prevent blood clots.  o You may return to work once you are authorized by your doctor.     WEIGHT BEARING   Weight bearing as tolerated with assist device (walker, cane, etc) as directed, use it as long as suggested by your surgeon or therapist, typically at least 4-6 weeks.   EXERCISES  Results after joint replacement surgery are often greatly improved when you follow the exercise, range of motion and muscle strengthening exercises prescribed by your doctor. Safety measures are also important to protect the joint from further injury. Any time any of these exercises cause you to have increased pain or swelling, decrease what you are doing until you are comfortable again and then slowly increase them. If you have problems or questions, call your caregiver or physical therapist for advice.   Rehabilitation is important following a joint replacement. After just a few days of immobilization, the muscles of the leg can become weakened and shrink (atrophy).  These exercises are designed to build up the tone and strength of the thigh and leg muscles and to improve motion. Often times heat used for twenty to thirty minutes before working out will loosen up your tissues and help with improving the range of motion but do not use heat for the first two weeks following surgery (sometimes heat can increase post-operative swelling).   These exercises can be done on a training (exercise) mat, on the  floor, on a table or on a bed. Use whatever works the best and is most comfortable for you.    Use music or television while you are exercising so that the exercises are a pleasant break in your day. This will make your life better with the exercises acting as a break in your routine that you can look forward to.   Perform all exercises about fifteen times, three times per day or as directed.  You should exercise both the operative leg and the other leg as well.  Exercises include:    Quad Sets -  Tighten up the muscle on the front of the thigh (Quad) and hold for 5-10 seconds.    Straight Leg Raises - With your knee straight (if you were given a brace, keep it on), lift the leg to 60 degrees, hold for 3 seconds, and slowly lower the leg.  Perform this exercise against resistance later as your leg gets stronger.   Leg Slides: Lying on your back, slowly slide your foot toward your buttocks, bending your knee up off the floor (only go as far as is comfortable). Then slowly slide your foot back down until your leg is flat on the floor again.   Angel Wings: Lying on your back spread your legs to the side as far apart as you can without causing discomfort.   Hamstring Strength:  Lying on your back, push your heel against the floor with your leg straight by tightening up the muscles of your buttocks.  Repeat, but this time bend your knee to a comfortable angle, and push your heel against the floor.  You may put a pillow under the heel to make it more comfortable if necessary.   A rehabilitation program following joint replacement surgery can speed recovery and prevent re-injury in the future due to weakened muscles. Contact your doctor or a physical therapist for more information on knee rehabilitation.    CONSTIPATION  Constipation is defined medically as fewer than three stools per week and severe constipation as less than one stool per week.  Even if you have a regular bowel pattern at home, your normal regimen is likely to be disrupted due to multiple reasons following surgery.  Combination of anesthesia, postoperative narcotics, change in appetite and fluid intake all can affect your bowels.   YOU MUST use at least one of the following options; they are listed in order of increasing strength to get the job done.  They are all available over the counter, and you may need to use some, POSSIBLY even all of these options:    Drink plenty of fluids (prune juice may be helpful) and high fiber  foods Colace 100 mg by mouth twice a day  Senokot for constipation as directed and as needed Dulcolax (bisacodyl), take with full glass of water  Miralax (polyethylene glycol) once or twice a day as needed.  If you have tried all these things and are unable to have a bowel movement in the first 3-4 days after surgery call either your surgeon or your primary doctor.    If you experience loose stools or diarrhea, hold the medications until you stool forms back up.  If your symptoms do not get better within 1 week or if they get worse, check with your doctor.  If you experience "the worst abdominal pain ever" or develop nausea or vomiting, please contact the office immediately for further recommendations for treatment.   ITCHING:  If you experience itching with  your medications, try taking only a single pain pill, or even half a pain pill at a time.  You can also use Benadryl over the counter for itching or also to help with sleep.   TED HOSE STOCKINGS:  Use stockings on both legs until for at least 2 weeks or as directed by physician office. They may be removed at night for sleeping.  MEDICATIONS:  See your medication summary on the After Visit Summary that nursing will review with you.  You may have some home medications which will be placed on hold until you complete the course of blood thinner medication.  It is important for you to complete the blood thinner medication as prescribed.  PRECAUTIONS:  If you experience chest pain or shortness of breath - call 911 immediately for transfer to the hospital emergency department.   If you develop a fever greater that 101 F, purulent drainage from wound, increased redness or drainage from wound, foul odor from the wound/dressing, or calf pain - CONTACT YOUR SURGEON.                                                   FOLLOW-UP APPOINTMENTS:  If you do not already have a post-op appointment, please call the office for an appointment to be seen by your  surgeon.  Guidelines for how soon to be seen are listed in your After Visit Summary, but are typically between 1-4 weeks after surgery.  OTHER INSTRUCTIONS:   Knee Replacement:  Do not place pillow under knee, focus on keeping the knee straight while resting. CPM instructions: 0-90 degrees, 2 hours in the morning, 2 hours in the afternoon, and 2 hours in the evening. Place foam block, curve side up under heel at all times except when in CPM or when walking.  DO NOT modify, tear, cut, or change the foam block in any way.  MAKE SURE YOU:   Understand these instructions.   Get help right away if you are not doing well or get worse.    Thank you for letting us be a part of your medical care team.  It is a privilege we respect greatly.  We hope these instructions will help you stay on track for a fast and full recovery!   ---------------------------------------------------------------------------------------------------------------------------------------------------------------------------------------------------------- Information on my medicine - ELIQUIS (apixaban)  This medication education was reviewed with me or my healthcare representative as part of my discharge preparation.   Why was Eliquis prescribed for you? Eliquis was prescribed for you to reduce the risk of blood clots forming after orthopedic surgery.    What do You need to know about Eliquis? Take your Eliquis TWICE DAILY - one tablet in the morning and one tablet in the evening with or without food.  It would be best to take the dose about the same time each day.  If you have difficulty swallowing the tablet whole please discuss with your pharmacist how to take the medication safely.  Take Eliquis exactly as prescribed by your doctor and DO NOT stop taking Eliquis without talking to the doctor who prescribed the medication.  Stopping without other medication to take the place of Eliquis may increase your risk of  developing a clot.  After discharge, you should have regular check-up appointments with your healthcare provider that is prescribing your Eliquis.  What do you do  if you miss a dose? If a dose of ELIQUIS is not taken at the scheduled time, take it as soon as possible on the same day and twice-daily administration should be resumed.  The dose should not be doubled to make up for a missed dose.  Do not take more than one tablet of ELIQUIS at the same time.  Important Safety Information A possible side effect of Eliquis is bleeding. You should call your healthcare provider right away if you experience any of the following: ? Bleeding from an injury or your nose that does not stop. ? Unusual colored urine (red or dark [place]) or unusual colored stools (red or black). ? Unusual bruising for unknown reasons. ? A serious fall or if you hit your head (even if there is no bleeding).  Some medicines may interact with Eliquis and might increase your risk of bleeding or clotting while on Eliquis. To help avoid this, consult your healthcare provider or pharmacist prior to using any new prescription or non-prescription medications, including herbals, vitamins, non-steroidal anti-inflammatory drugs (NSAIDs) and supplements.  This website has more information on Eliquis (apixaban): http://www.eliquis.com/eliquis/home

## 2015-07-13 ENCOUNTER — Encounter (HOSPITAL_COMMUNITY): Payer: Self-pay | Admitting: Orthopedic Surgery

## 2015-07-13 LAB — BASIC METABOLIC PANEL
ANION GAP: 8 (ref 5–15)
BUN: 5 mg/dL — ABNORMAL LOW (ref 6–20)
CHLORIDE: 103 mmol/L (ref 101–111)
CO2: 23 mmol/L (ref 22–32)
Calcium: 8.6 mg/dL — ABNORMAL LOW (ref 8.9–10.3)
Creatinine, Ser: 0.7 mg/dL (ref 0.44–1.00)
GFR calc Af Amer: >60 mL/min (ref >60)
GFR calc non Af Amer: >60 mL/min (ref >60)
GLUCOSE: 138 mg/dL — AB (ref 65–99)
POTASSIUM: 3.5 mmol/L (ref 3.5–5.1)
Sodium: 134 mmol/L — ABNORMAL LOW (ref 135–145)

## 2015-07-13 LAB — CBC
HEMATOCRIT: 29.6 % — AB (ref 36.0–46.0)
HEMOGLOBIN: 9.5 g/dL — AB (ref 12.0–15.0)
MCH: 27.9 pg (ref 26.0–34.0)
MCHC: 32.1 g/dL (ref 30.0–36.0)
MCV: 86.8 fL (ref 78.0–100.0)
Platelets: 269 10*3/uL (ref 150–400)
RBC: 3.41 MIL/uL — AB (ref 3.87–5.11)
RDW: 14.2 % (ref 11.5–15.5)
WBC: 8.4 10*3/uL (ref 4.0–10.5)

## 2015-07-13 NOTE — Progress Notes (Signed)
Subjective: 1 Day Post-Op Procedure(s) (LRB): TOTAL KNEE ARTHROPLASTY (Right) Patient reports pain as mild.  Patient reports lightheadedness when standing.  No nausea/vomiting, chest pain/sob.  Tolerating diet.    Objective: Vital signs in last 24 hours: Temp:  [98 F (36.7 C)-99.5 F (37.5 C)] 99.3 F (37.4 C) (03/16 0500) Pulse Rate:  [52-104] 88 (03/16 0500) Resp:  [15-20] 18 (03/16 0500) BP: (108-134)/(55-82) 134/81 mmHg (03/16 0500) SpO2:  [99 %-100 %] 100 % (03/16 0500)  Intake/Output from previous day: 03/15 0701 - 03/16 0700 In: 760 [P.O.:760] Out: 2325 [Urine:2300; Blood:25] Intake/Output this shift: Total I/O In: 520 [P.O.:520] Out: 1500 [Urine:1500]   Recent Labs  07/13/15 0611  HGB 9.5*    Recent Labs  07/13/15 0611  WBC 8.4  RBC 3.41*  HCT 29.6*  PLT 269   No results for input(s): NA, K, CL, CO2, BUN, CREATININE, GLUCOSE, CALCIUM in the last 72 hours. No results for input(s): LABPT, INR in the last 72 hours.  Neurologically intact Neurovascular intact Sensation intact distally Intact pulses distally Dorsiflexion/Plantar flexion intact Compartment soft  No drainage noted through dressing Negative calf ttp  Assessment/Plan: 1 Day Post-Op Procedure(s) (LRB): TOTAL KNEE ARTHROPLASTY (Right) Advance diet UP with PT WBAT RLE D/C to SNF once approved by insurance Dry dressing change prn  Fannie Knee 07/13/2015, 6:55 AM

## 2015-07-13 NOTE — Progress Notes (Signed)
Orthopedic Tech Progress Note Patient Details:  Anne Brewer [birth year] [id number]  CPM Right Knee CPM Right Knee: On Right Knee Flexion (Degrees): 90 Right Knee Extension (Degrees): 0 Additional Comments: trapeze bar patient helper   [place] Pink 07/13/2015, 3:05 PM

## 2015-07-13 NOTE — Progress Notes (Signed)
Occupational Therapy Treatment Patient Details Name: Anne Brewer MRN: [id number] DOB: [birth year] Today's Date: 07/13/2015    History of present illness Pt presents for right TKA. PMH: OA and lap gastric sleeve   OT comments  Pt making significant progress towards occupational therapy goals. Pt completed LB dressing tasks and functional transfers with min guard assist. Provided education on knee precautions, CPM use, and compensatory strategies for LB ADLs. Updated discharge plan to [facility] as pt has progressed well. Will continue to follow acutely to address OT needs and goals.    Follow Up Recommendations  Home health OT;Supervision/Assistance - 24 hour    Equipment Recommendations  3 in 1 bedside comode    Recommendations for Other Services      Precautions / Restrictions Precautions Precautions: Knee Precaution Booklet Issued: No Precaution Comments: reviewed not placing pillow, ice pack or other object under R knee Restrictions Weight Bearing Restrictions: Yes RLE Weight Bearing: Weight bearing as tolerated       Mobility Bed Mobility Overal bed mobility: Needs Assistance Bed Mobility: Supine to Sit;Sit to Supine     Supine to sit: Supervision Sit to supine: Supervision   General bed mobility comments: HOB flat, use of bedrails. Supervision for safety.  Transfers Overall transfer level: Needs assistance Equipment used: Rolling walker (2 wheeled) Transfers: Sit to/from Stand Sit to Stand: Min guard         General transfer comment: Min guard for safety and balance. Verbal cues for safe hand placement.    Balance Overall balance assessment: Needs assistance Sitting-balance support: No upper extremity supported;Feet supported Sitting balance-Leahy Scale: Good     Standing balance support: Bilateral upper extremity supported;During functional activity Standing balance-Leahy Scale: Poor Standing balance comment: Reliant on UE support for standing balance                    ADL Overall ADL's : Needs assistance/impaired     Grooming: Wash/dry hands;Min guard;Standing   Upper Body Bathing: Set up;Sitting   Lower Body Bathing: Set up;Sit to/from stand   Upper Body Dressing : Set up;Sitting   Lower Body Dressing: Set up;Sit to/from stand Lower Body Dressing Details (indicate cue type and reason): Pt able to reach both feet for LB dressing Toilet Transfer: Min guard;Cueing for safety;Ambulation;BSC;RW Toilet Transfer Details (indicate cue type and reason): BSC over toilet, cues to feel BSC on back of legs before reaching back to sit Toileting- Clothing Manipulation and Hygiene: Min guard;Sit to/from stand       Functional mobility during ADLs: Min guard;Rolling walker General ADL Comments: Reviewed knee precautions, CPM use, and compensatory strategies for ADLs.      Vision                     Perception     Praxis      Cognition   Behavior During Therapy: WFL for tasks assessed/performed Overall Cognitive Status: Within Functional Limits for tasks assessed                       Extremity/Trunk Assessment               Exercises     Shoulder Instructions       General Comments      Pertinent Vitals/ Pain       Pain Assessment: 0-10 Pain Score: 6  Pain Location: inside of R knee Pain Descriptors / Indicators: Aching;Sore Pain Intervention(s): Limited activity within patient's  tolerance;Monitored during session;Repositioned;Premedicated before session  Home Living                                          Prior Functioning/Environment              Frequency Min 2X/week     Progress Toward Goals  OT Goals(current goals can now be found in the care plan section)  Progress towards OT goals: Progressing toward goals  Acute Rehab OT Goals Patient Stated Goal: to decrease pain OT Goal Formulation: With patient Time For Goal Achievement: 07/26/15 Potential to  Achieve Goals: Good ADL Goals Pt Will Perform Grooming: with supervision;standing Pt Will Perform Lower Body Bathing: with supervision;sit to/from stand Pt Will Perform Lower Body Dressing: with supervision;sit to/from stand Pt Will Transfer to Toilet: with supervision;ambulating;bedside commode Pt Will Perform Toileting - Clothing Manipulation and hygiene: with supervision;sit to/from stand Pt Will Perform Tub/Shower Transfer: Tub transfer;with supervision;ambulating;3 in 1;rolling walker  Plan Discharge plan needs to be updated    Co-evaluation                 End of Session Equipment Utilized During Treatment: Gait belt;Rolling walker CPM Right Knee CPM Right Knee: On   Activity Tolerance Patient tolerated treatment well   Patient Left in bed;with call bell/phone within reach;Other (comment) (CPM on)   Nurse Communication Mobility status;Other (comment) (pt requesting laxative)        Time: 1730-1755 OT Time Calculation (min): 25 min  Charges: OT General Charges $OT Visit: 1 Procedure OT Treatments $Self Care/Home Management : 23-37 mins  Redmond Baseman, OTR/L Pager: [phone] 07/13/2015, 6:04 PM

## 2015-07-13 NOTE — Progress Notes (Signed)
Orthopedic Tech Progress Note Patient Details:  Anne Brewer [birth year] [id number]  Patient ID: [name], female   DOB: 08-Mar-1971, 45 y.o.   MRN: [id number] Pt. refused cpm wants to bathe and get up.  Karolee Stamps 07/13/2015, 5:49 AM

## 2015-07-13 NOTE — Progress Notes (Signed)
Physical Therapy Treatment Patient Details Name: Anne Brewer MRN: [id number] DOB: [birth year] Today's Date: 07/13/2015    History of Present Illness Pt presents for right TKA. PMH: OA and lap gastric sleeve    PT Comments    Patient is making progress with PT but limited by pain and needs cues to stay task focused. Pt a little impulsive throughout session. Due to patient's limited assistance at home, recommending SNF for further skilled PT services to maximize safety and independence with mobility.   Follow Up Recommendations  SNF;Supervision for mobility/OOB     Equipment Recommendations  Rolling walker with 5" wheels;3in1 (PT)    Recommendations for Other Services OT consult     Precautions / Restrictions Precautions Precautions: Knee Precaution Booklet Issued: No Precaution Comments: reviewed proper positioning. Pt tends to externally rotate right hip, educated her on keeping hip in midline with lateral support of pillows or bed rail Restrictions Weight Bearing Restrictions: Yes RLE Weight Bearing: Weight bearing as tolerated    Mobility  Bed Mobility               General bed mobility comments: OOB in chair upon arrival  Transfers Overall transfer level: Needs assistance Equipment used: Rolling walker (2 wheeled) Transfers: Sit to/from Stand Sit to Stand: Min guard         General transfer comment: min guard for safety; cues for hand placement and technique; c/o dizziness upon intial stand  Ambulation/Gait Ambulation/Gait assistance: Min guard Ambulation Distance (Feet): 80 Feet Assistive device: Rolling walker (2 wheeled) Gait Pattern/deviations: Step-through pattern;Decreased stride length;Decreased stance time - right;Decreased dorsiflexion - right;Antalgic;Trunk flexed     General Gait Details: cues for engaging R quad before WS to R LE, R heel strike, posture, and position of RW; pt with tendency to keep R knee slightly flexed   Stairs             Wheelchair Mobility    Modified Rankin (Stroke Patients Only)       Balance Overall balance assessment: No apparent balance deficits (not formally assessed)                                  Cognition Arousal/Alertness: Lethargic;Suspect due to medications Behavior During Therapy: [facility] for tasks assessed/performed Overall Cognitive Status: Within Functional Limits for tasks assessed                      Exercises Total Joint Exercises Ankle Circles/Pumps: AROM;Both;15 reps;Seated Quad Sets: AROM;Right;10 reps;Seated Heel Slides: AAROM;Right;10 reps;Seated Goniometric ROM: 10-35    General Comments General comments (skin integrity, edema, etc.): pt a little impulsive and difficult to keep task focused throuhgout session; pt demonstrated decreaed safety awarness and required max cues to perform therex       Pertinent Vitals/Pain Pain Assessment: 0-10 Pain Score: 8  Pain Location: L knee Pain Descriptors / Indicators: Aching;Sore;Tightness Pain Intervention(s): Limited activity within patient's tolerance;Monitored during session;Premedicated before session;Repositioned    Home Living                      Prior Function            PT Goals (current goals can now be found in the care plan section) Acute Rehab PT Goals Patient Stated Goal: go to rehab PT Goal Formulation: With patient Time For Goal Achievement: 07/19/15 Potential to Achieve Goals: Good Progress towards PT  goals: Progressing toward goals    Frequency  7X/week    PT Plan Discharge plan needs to be updated    Co-evaluation             End of Session Equipment Utilized During Treatment: Gait belt;Oxygen Activity Tolerance: Patient limited by pain Patient left: with call bell/phone within reach;with nursing/sitter in room (in bathroom with NT)     Time: [id number] PT Time Calculation (min) (ACUTE ONLY): 26 min  Charges:  $Gait Training: 8-22  mins $Therapeutic Exercise: 8-22 mins                    G Codes:      Salina April, PTA Pager: [phone]   07/13/2015, 11:53 AM

## 2015-07-13 NOTE — Progress Notes (Signed)
Orthopedic Tech Progress Note Patient Details:  Anne Brewer [birth year] [id number] Ortho visit took patient out of cpm and readjusted cpm Patient ID: Anne Brewer, female   DOB: 1970-11-22, 45 y.o.   MRN: [id number]   Braulio Bosch 07/13/2015, 7:15 PM

## 2015-07-14 LAB — CBC
HCT: 26.1 % — ABNORMAL LOW (ref 36.0–46.0)
Hemoglobin: 8.2 g/dL — ABNORMAL LOW (ref 12.0–15.0)
MCH: 27.3 pg (ref 26.0–34.0)
MCHC: 31.4 g/dL (ref 30.0–36.0)
MCV: 87 fL (ref 78.0–100.0)
PLATELETS: 269 10*3/uL (ref 150–400)
RBC: 3 MIL/uL — ABNORMAL LOW (ref 3.87–5.11)
RDW: 14.5 % (ref 11.5–15.5)
WBC: 8.9 10*3/uL (ref 4.0–10.5)

## 2015-07-14 LAB — BASIC METABOLIC PANEL
Anion gap: 8 (ref 5–15)
CALCIUM: 8.8 mg/dL — AB (ref 8.9–10.3)
CO2: 26 mmol/L (ref 22–32)
Chloride: 104 mmol/L (ref 101–111)
Creatinine, Ser: 0.61 mg/dL (ref 0.44–1.00)
GFR calc Af Amer: >60 mL/min (ref >60)
GLUCOSE: 85 mg/dL (ref 65–99)
Potassium: 3.9 mmol/L (ref 3.5–5.1)
Sodium: 138 mmol/L (ref 135–145)

## 2015-07-14 NOTE — Clinical Social Work Placement (Signed)
   CLINICAL SOCIAL WORK PLACEMENT  NOTE  Date:  07/14/2015  Patient Details  Name: Anne Brewer MRN: [id number] Date of Birth: 01-18-1971  Clinical Social Work is seeking post-discharge placement for this patient at the   level of care (*CSW will initial, date and re-position this form in  chart as items are completed):  Yes   Patient/family provided with [place] Work Department's list of facilities offering this level of care within the geographic area requested by the patient (or if unable, by the patient's family).  Yes   Patient/family informed of their freedom to choose among providers that offer the needed level of care, that participate in Medicare, Medicaid or managed care program needed by the patient, have an available bed and are willing to accept the patient.  Yes   Patient/family informed of [place]'s ownership interest in [facility] and [facility], as well as of the fact that they are under no obligation to receive care at these facilities.  PASRR submitted to EDS on 07/14/15     PASRR number received on 07/14/15     Existing PASRR number confirmed on       FL2 transmitted to all facilities in geographic area requested by pt/family on 07/14/15     FL2 transmitted to all facilities within larger geographic area on       Patient informed that his/her managed care company has contracts with or will negotiate with certain facilities, including the following:        Yes   Patient/family informed of bed offers received.  Patient chooses bed at [facility]     Physician recommends and patient chooses bed at      Patient to be transferred to [facility] on 07/14/15.  Patient to be transferred to facility by [facility]     Patient family notified on 07/14/15 of transfer.  Name of family member notified:  patient is alert and oriented and will inform family as she deems appropriate.  Declined CSW assistance     PHYSICIAN Please prepare  priority discharge summary, including medications     Additional Comment:    _______________________________________________ Dulcy Fanny, LCSW 07/14/2015, 9:50 AM

## 2015-07-14 NOTE — Clinical Social Work Note (Signed)
Clinical Social Work Assessment  Patient Details  Name: Anne Brewer MRN: [id number] Date of Birth: April 13, 1971  Date of referral:  07/14/15               Reason for consult:  Facility Placement                Permission sought to share information with:  Chartered certified accountant granted to share information::  Yes, Verbal Permission Granted  Name::        Agency::   (Dr office RNCM- [facility])  Relationship::     Contact Information:     Housing/Transportation Living arrangements for the past 2 months:  [name] of Information:  Patient, Medical Team Patient Interpreter Needed:  None Criminal Activity/Legal Involvement Pertinent to Current Situation/Hospitalization:  No - Comment as needed Significant Relationships:  Friend Lives with:  Self Do you feel safe going back to the place where you live?  No Need for family participation in patient care:  No (Coment)  Care giving concerns:  none  Facilities manager / plan:  Patient is a bundled patient and has been [facility] up with [facility] for STR at time of discharge.  Patient progressing slowly with PT.    Employment status:  Disabled (Comment on whether or not currently receiving Disability) Insurance information:  Medicare PT Recommendations:  [place] / Referral to community resources:  [place]  Patient/Family's Response to care:  Agreeable to SNF  Patient/Family's Understanding of and Emotional Response to Diagnosis, Current Treatment, and Prognosis:  Patient remains realistic regarding level of care needed at time of discharge.  Emotional Assessment Appearance:  Appears older than stated age Attitude/Demeanor/Rapport:    Affect (typically observed):  Accepting, Anxious Orientation:  Oriented to Self, Oriented to Place, Oriented to  Time, Oriented to Situation Alcohol / Substance use:  Not Applicable Psych  involvement (Current and /or in the community):  No (Comment)  Discharge Needs  Concerns to be addressed:  No discharge needs identified Readmission within the last 30 days:  No Current discharge risk:  None Barriers to Discharge:  No Barriers Identified   Dulcy Fanny, LCSW 07/14/2015, 9:49 AM

## 2015-07-14 NOTE — Progress Notes (Signed)
Orthopedic Tech Progress Note Patient Details:  NERIA THIBAUDEAU 04/23/1971 [id number]  Patient ID: [name], female   DOB: 11-Dec-1970, 45 y.o.   MRN: [id number] Pt. refused cpm. Wants to sleep more before getting in cpm. Karolee Stamps 07/14/2015, 5:38 AM

## 2015-07-14 NOTE — Clinical Social Work Note (Signed)
CSW reviewed disposition with RNCM, RNCM liaison at MD office and liaison at SNF.  Patient progressing well and ambulating independently around unit with rolling walker.  Patient to be discharged home.  All parties aware and agreeable.  Nonnie Done, LCSW [phone]  5N1-9; 2S 15-16 and [place] Licensed Clinical Social Worker

## 2015-07-14 NOTE — Progress Notes (Signed)
Pt discharge education and instructions completed with pt and partner at bedside; both voices understanding and denies any questions. Pt IV removed; pt handed her prescriptions for eliquis, bisacodyl, robaxin, zofran and roxicet. Pt discharge home with partner to transport her home. Pt awaiting on her crutches to be delivered to her at bedside. Pt incision dsg remains clean, dry and intact with no stain or active bleeding noted. Delia Heady RN

## 2015-07-14 NOTE — Progress Notes (Signed)
Physical Therapy Treatment Patient Details Name: Anne Brewer MRN: [id number] DOB: 13-Jun-1970 Today's Date: 07/14/2015    History of Present Illness Pt presents for right TKA. PMH: OA and lap gastric sleeve    PT Comments    Patient is making good progress with PT and received ambulating in hallway this session.  From a mobility standpoint anticipate patient will be ready for DC home when medically ready.  Educated pt in use of crutches, HEP, and given handout. Requested RN submit order for crutches. Pt feels she will not have room to use RW in home upon d/c.  Recommending HHPT for further skilled PT services to maximize safety and independence with mobility.    Follow Up Recommendations  Home health PT;Supervision - Intermittent     Equipment Recommendations  Rolling walker with 5" wheels;3in1 (PT);Crutches    Recommendations for Other Services OT consult     Precautions / Restrictions Precautions Precautions: Knee Precaution Booklet Issued: No Precaution Comments: reviewed proper positioning. Pt tends to externally rotate right hip, educated her on keeping hip in midline with lateral support of pillows or bed rail Restrictions Weight Bearing Restrictions: Yes RLE Weight Bearing: Weight bearing as tolerated    Mobility  Bed Mobility               General bed mobility comments: received ambulating in hallway  Transfers Overall transfer level: Needs assistance Equipment used: Rolling walker (2 wheeled) Transfers: Sit to/from Stand Sit to Stand: Modified independent (Device/Increase time)         General transfer comment: increased time  Ambulation/Gait Ambulation/Gait assistance: Supervision Ambulation Distance (Feet): 120 Feet (38ft with RW and 38ft with crutches) Assistive device: Rolling walker (2 wheeled);Crutches Gait Pattern/deviations: Step-through pattern;Decreased stride length;Trunk flexed     General Gait Details: pt with improved gait  mechanics; slow and steady gait pattern; educated on use of crutches with pt demonstrating carry over of sequencing from prior use of crutches from another injury; no unsteadiness; pt declined stair training this session   Stairs            Wheelchair Mobility    Modified Rankin (Stroke Patients Only)       Balance Overall balance assessment: Needs assistance Sitting-balance support: No upper extremity supported;Feet supported Sitting balance-Leahy Scale: Good     Standing balance support: No upper extremity supported Standing balance-Leahy Scale: Fair                      Cognition Arousal/Alertness: Awake/alert Behavior During Therapy: WFL for tasks assessed/performed Overall Cognitive Status: Within Functional Limits for tasks assessed                      Exercises Total Joint Exercises Heel Slides: AAROM;Right;10 reps;Seated Goniometric ROM: 5-45    General Comments General comments (skin integrity, edema, etc.): pt reported that when d/c'd whe will be returning to camper and feels whe will not be able to use RW inside and would like to be able to use crutches instead; educated on HEP and given handout      Pertinent Vitals/Pain Pain Assessment: Faces Faces Pain Scale: Hurts little more Pain Location: R knee with flexion Pain Descriptors / Indicators: Sore Pain Intervention(s): Limited activity within patient's tolerance;Monitored during session;Repositioned;Patient requesting pain meds-RN notified    Home Living                      Prior Function  PT Goals (current goals can now be found in the care plan section) Acute Rehab PT Goals Patient Stated Goal: go to rehab PT Goal Formulation: With patient Time For Goal Achievement: 07/19/15 Potential to Achieve Goals: Good Progress towards PT goals: Progressing toward goals    Frequency  7X/week    PT Plan Discharge plan needs to be updated    Co-evaluation              End of Session Equipment Utilized During Treatment: Gait belt Activity Tolerance: Patient tolerated treatment well Patient left: with call bell/phone within reach;with nursing/sitter in room;in chair     Time: 1038-1100 PT Time Calculation (min) (ACUTE ONLY): 22 min  Charges:  $Gait Training: 8-22 mins                    G Codes:      [place] April, PTA Pager: [phone]   07/14/2015, 11:13 AM

## 2015-07-14 NOTE — Progress Notes (Signed)
Subjective: 2 Days Post-Op Procedure(s) (LRB): TOTAL KNEE ARTHROPLASTY (Right) Patient reports pain as moderate.  Patient still reports lightheadedness but states this is improving.  No chest pain/sob, nausea or vomiting.  Tolerating diet.   Objective: Vital signs in last 24 hours: Temp:  [98.4 F (36.9 C)-98.9 F (37.2 C)] 98.4 F (36.9 C) (03/16 2048) Pulse Rate:  [63-65] 63 (03/16 2048) Resp:  [16-18] 16 (03/16 2048) BP: (110-114)/(52-76) 114/52 mmHg (03/16 2048) SpO2:  [100 %] 100 % (03/16 2048)  Intake/Output from previous day: 03/16 0701 - 03/17 0700 In: 960 [P.O.:960] Out: -  Intake/Output this shift:     Recent Labs  07/13/15 0611  HGB 9.5*    Recent Labs  07/13/15 0611  WBC 8.4  RBC 3.41*  HCT 29.6*  PLT 269    Recent Labs  07/13/15 0611  NA 134*  K 3.5  CL 103  CO2 23  BUN 5*  CREATININE 0.70  GLUCOSE 138*  CALCIUM 8.6*   No results for input(s): LABPT, INR in the last 72 hours.  Neurologically intact Neurovascular intact Sensation intact distally Intact pulses distally Dorsiflexion/Plantar flexion intact Incision: no drainage No cellulitis present Compartment soft  Assessment/Plan: 2 Days Post-Op Procedure(s) (LRB): TOTAL KNEE ARTHROPLASTY (Right) Advance diet Up with therapy Discharge to SNF once approved by insurance WBAT RLE ABLA-mild and stable Dry dressing change prn Please place compression stocking to RLE asap  Fannie Knee 07/14/2015, 8:04 AM

## 2015-07-14 NOTE — Progress Notes (Signed)
Orthopedic Tech Progress Note Patient Details:  DAMIYAH BYRDSONG [birth year] [id number]  Ortho Devices Type of Ortho Device: Crutches Ortho Device/Splint Interventions: Application   [place] Pink 07/14/2015, 2:01 PM

## 2015-07-14 NOTE — Care Management Important Message (Signed)
Important Message  Patient Details  Name: Anne Brewer MRN: [id number] Date of Birth: [birth year]   Medicare Important Message Given:  Yes    Iliya Spivack P Keyarah Mcroy 07/14/2015, 3:00 PM

## 2015-07-14 NOTE — Progress Notes (Signed)
Pt crutches delivered to pt at bedside; pt transported off unit via wheelchair with belongings and partner at side. PDarden Palmer Rodrick Payson Rn

## 2015-07-14 NOTE — NC FL2 (Signed)
[place] LEVEL OF CARE SCREENING TOOL     IDENTIFICATION  Patient Name: Anne Brewer Birthdate: [birth year] Sex: female Admission Date (Current Location): 07/12/2015  [facility] and [place] Number:  Herbalist and Address:  [facility], [place] [address] [place], [place] [address]      Provider Number: [id number]  Attending Physician Name and Address:  [name], MD  Relative Name and Phone Number:       Current Level of Care: Hospital Recommended Level of Care: [place] Prior Approval Number:    Date Approved/Denied:   PASRR Number: [id number]  Discharge Plan: SNF    Current Diagnoses: Patient Active Problem List   Diagnosis Date Noted  . S/P total knee replacement 07/12/2015    Orientation RESPIRATION BLADDER Height & Weight     Self, Time, Situation, Place  Normal Continent Weight: 206 lb 1.6 oz (93.486 kg) Height:  5' 7.5" (171.5 cm)  BEHAVIORAL SYMPTOMS/MOOD NEUROLOGICAL BOWEL NUTRITION STATUS      Continent    AMBULATORY STATUS COMMUNICATION OF NEEDS Skin   Limited Assist Verbally Surgical wounds                       Personal Care Assistance Level of Assistance  Dressing, Bathing Bathing Assistance: Limited assistance   Dressing Assistance: Limited assistance     Functional Limitations Info             SPECIAL CARE FACTORS FREQUENCY  PT (By licensed PT)     PT Frequency: daily OT Frequency: daily            Contractures Contractures Info: Not present    Additional Factors Info  Allergies   Allergies Info: shellfish and lactose intolerance           Current Medications (07/14/2015):  This is the current hospital active medication list Current Facility-Administered Medications  Medication Dose Route Frequency Provider Last Rate Last Dose  . acetaminophen (TYLENOL) tablet 650 mg  650 mg Oral Q6H PRN Aundra Dubin, PA-C   650 mg at 07/13/15 1159   Or  .  acetaminophen (TYLENOL) suppository 650 mg  650 mg Rectal Q6H PRN Aundra Dubin, PA-C      . alum & mag hydroxide-simeth (MAALOX/MYLANTA) 200-200-20 MG/5ML suspension 30 mL  30 mL Oral Q4H PRN Aundra Dubin, PA-C      . apixaban Arne Cleveland) tablet 2.5 mg  2.5 mg Oral Q12H Aundra Dubin, PA-C   2.5 mg at 07/14/15 [id number]  . bisacodyl (DULCOLAX) suppository 10 mg  10 mg Rectal Daily PRN Aundra Dubin, PA-C      . celecoxib (CELEBREX) capsule 200 mg  200 mg Oral Q12H Aundra Dubin, PA-C   200 mg at 07/14/15 [id number]  . diphenhydrAMINE (BENADRYL) 12.5 MG/5ML elixir 12.5-25 mg  12.5-25 mg Oral Q4H PRN Aundra Dubin, PA-C      . docusate sodium (COLACE) capsule 100 mg  100 mg Oral BID Aundra Dubin, PA-C   100 mg at 07/14/15 [id number]  . HYDROmorphone (DILAUDID) injection 0.5-1 mg  0.5-1 mg Intravenous Q2H PRN Aundra Dubin, PA-C   1 mg at 07/13/15 [id number]  . magnesium citrate solution 1 Bottle  1 Bottle Oral Once PRN Aundra Dubin, PA-C      . menthol-cetylpyridinium (CEPACOL) lozenge 3 mg  1 lozenge Oral PRN Aundra Dubin, PA-C  Or  . phenol (CHLORASEPTIC) mouth spray 1 spray  1 spray Mouth/Throat PRN Aundra Dubin, PA-C      . methocarbamol (ROBAXIN) tablet 500 mg  500 mg Oral Q6H PRN Aundra Dubin, PA-C   500 mg at 07/14/15 [id number]   Or  . methocarbamol (ROBAXIN) 500 mg in dextrose 5 % 50 mL IVPB  500 mg Intravenous Q6H PRN Aundra Dubin, PA-C      . metoCLOPramide (REGLAN) tablet 5-10 mg  5-10 mg Oral Q8H PRN Aundra Dubin, PA-C       Or  . metoCLOPramide (REGLAN) injection 5-10 mg  5-10 mg Intravenous Q8H PRN Aundra Dubin, PA-C      . ondansetron [facility]) tablet 4 mg  4 mg Oral Q6H PRN Aundra Dubin, PA-C       Or  . ondansetron [facility]) injection 4 mg  4 mg Intravenous Q6H PRN Aundra Dubin, PA-C   4 mg at 07/12/15 1725  . oxyCODONE (Oxy IR/ROXICODONE) immediate release tablet 5-10 mg  5-10 mg Oral Q3H PRN Aundra Dubin, PA-C   10 mg at 07/14/15 0644  . senna-docusate  (Senokot-S) tablet 1 tablet  1 tablet Oral QHS PRN Aundra Dubin, PA-C   1 tablet at 07/14/15 0849  . zolpidem (AMBIEN) tablet 5 mg  5 mg Oral QHS PRN Aundra Dubin, PA-C         Discharge Medications: Please see discharge summary for a list of discharge medications.  Relevant Imaging Results:  Relevant Lab Results:   Additional Information SSN: [id number]  Dulcy Fanny, LCSW

## 2015-07-26 ENCOUNTER — Ambulatory Visit: Payer: Medicare Other | Attending: Orthopedic Surgery

## 2015-08-02 ENCOUNTER — Ambulatory Visit: Payer: Medicare Other | Attending: Orthopedic Surgery | Admitting: Physical Therapy

## 2015-08-02 DIAGNOSIS — M25661 Stiffness of right knee, not elsewhere classified: Secondary | ICD-10-CM | POA: Insufficient documentation

## 2015-08-02 DIAGNOSIS — R262 Difficulty in walking, not elsewhere classified: Secondary | ICD-10-CM | POA: Diagnosis present

## 2015-08-02 DIAGNOSIS — R600 Localized edema: Secondary | ICD-10-CM

## 2015-08-02 DIAGNOSIS — M6281 Muscle weakness (generalized): Secondary | ICD-10-CM

## 2015-08-02 NOTE — Therapy (Signed)
[place], [place], [address] Phone: [phone]   Fax:  [phone]  Physical Therapy Evaluation  Patient Details  Name: Anne Brewer MRN: [id number] Date of Birth: 04-12-1971 Referring Provider: Dr. Kathryne Hitch  Encounter Date: 08/02/2015      PT End of Session - 08/02/15 0955    Visit Number 1   Number of Visits 24   Date for PT Re-Evaluation 09/27/15   PT Start Time 0858  late, started FOTO   PT Stop Time 0940   PT Time Calculation (min) 42 min   Activity Tolerance Patient tolerated treatment well   Behavior During Therapy [facility] for tasks assessed/performed      Past Medical History  Diagnosis Date  . Arthritis   . Anemia     Past Surgical History  Procedure Laterality Date  . Abdominal surgery    . Cesarean section    . Cholecystectomy    . Laparoscopic gastric sleeve resection    . Total knee arthroplasty Right 07/12/2015    Procedure: TOTAL KNEE ARTHROPLASTY;  Surgeon: [name], MD;  Location: [facility];  Service: Orthopedics;  Laterality: Right;    There were no vitals filed for this visit.  Visit Diagnosis:  Localized edema  Stiffness of right knee, not elsewhere classified  Difficulty in walking, not elsewhere classified  Muscle weakness (generalized)      Subjective Assessment - 08/02/15 0900    Subjective Pt underwent Rt. TKR on 07/12/15.  She fell while at school in Oct.  2016  and as she underwent treatment, dsiscovered mod to severe OA in knee.   She reports cont. pain and soreness, dififculty walking, edema (in both legs).  Had Radom for 2 weeks.  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     Pertinent History gastric sleeve with 284 lbs weight loss, arthritis, anemia   Limitations Sitting;Lifting;Standing;Walking;House hold activities;Other (comment)   How long can you stand comfortably? <5 min pain increases, dizzy   How long can you walk comfortably? <5 min  pain, dizzy   Diagnostic tests post surgical    Patient Stated Goals pain relief, keep going to school   Currently in Pain? Yes   Pain Score 8    Pain Location Knee   Pain Orientation Right;Medial   Pain Descriptors / Indicators Sore;Aching   Pain Type Surgical pain   Pain Onset 1 to 4 weeks ago   Pain Frequency Constant   Aggravating Factors  weightbearing   Pain Relieving Factors meds, ice, warm, sitting    Effect of Pain on Daily Activities difficultt to concentrate, be mobile    Multiple Pain Sites No            [facility] PT Assessment - 08/02/15 0905    Assessment   Medical Diagnosis Rt. TKR   Referring Provider Dr. Kathryne Hitch   Onset Date/Surgical Date 07/12/15   Hand Dominance Right   Next MD Visit 08/22/15   Prior Therapy [facility]    Precautions   Precautions None   Restrictions   Weight Bearing Restrictions No   Balance Screen   Has the patient fallen in the past 6 months No  1 time at school    Has the patient had a decrease in activity level because of a fear of falling?  Yes   Is the patient reluctant to leave their home because of a fear of falling?  No   Home Environment  Living Environment Private residence   [place] Non-relatives/Friends  Dr. said she cannot be home alone (meds?)   Additional Comments does not current have a home,may do student housing   Prior Function   Level of Midwife   Leisure knit, reading, playing sports   Cognition   Overall Cognitive Status Within Functional Limits for tasks assessed   Awareness Impaired   Awareness Impairment Intellectual impairment  asks "why am I so sore/swollen/stiff"   Behaviors Other (comment)  groggy   Circumferential Edema   Circumferential - Right mid patella: 19.75in mid calf 17 in ankle 9.25   Circumferential - Left  mid patella 17.5 mid calf 16 7/8 ankle 8 3/8   Sensation   Light Touch Appears Intact   Coordination   Gross Motor Movements are  Fluid and Coordinated Not tested   Posture/Postural Control   Posture/Postural Control Postural limitations   Postural Limitations Rounded Shoulders;Forward head;Flexed trunk   AROM   Right Knee Extension 0   Right Knee Flexion 76  AAROM to 90 deg   Left Knee Extension 0   Left Knee Flexion 130   PROM   Overall PROM Comments R knee flexion 90  degrees    Strength   Right Hip Flexion 3/5   Right Hip ABduction 4/5   Left Hip Flexion 5/5   Left Hip ABduction --  declined to lie on L side   Right Knee Flexion 4/5   Right Knee Extension 3/5   Left Knee Flexion 4+/5   Left Knee Extension 5/5   Palpation   Patella mobility Slight restriction in R patella mobility compared to the L   Palpation comment Tender to palpation in the medial R knee joint line.    Transfers   Transfers Sit to Stand;Stand to [employer];Supine to Sit;Sit to Supine   Sit to Stand 6: Modified independent (Device/Increase time)   Stand to Sit 6: Modified independent (Device/Increase time)   Stand Pivot Transfers 5: Supervision   Stand Pivot Transfer Details (indicate cue type and reason) no device standby   Supine to Sit 6: Modified independent (Device/Increase time)   Sit to Supine 6: Modified independent (Device/Increase time)   Ambulation/Gait   Ambulation/Gait Yes   Ambulation/Gait Assistance 6: Modified independent (Device/Increase time)   Ambulation Distance (Feet) 150 Feet   Assistive device Crutches   Gait Pattern Step-through pattern;Decreased stance time - right   Ambulation Surface Level;Indoor   Gait Comments Pt able to walk at a smooth pace, barely noticeable limp, good heel strike and fair amt of knee flexion during swing stance                   [facility] Adult PT Treatment/Exercise - 08/02/15 0905    Knee/Hip Exercises: Stretches   Knee: Self-Stretch to increase Flexion Right;5 reps   Knee: Self-Stretch Limitations sheet to increase , heel slides prior with no hold time,  up to 90 deg with pain    Vasopneumatic   Number Minutes Vasopneumatic  15 minutes   Vasopnuematic Location  Knee   Vasopneumatic Pressure Low   Vasopneumatic Temperature  32                PT Education - 08/02/15 0954    Education provided Yes   Education Details PT/POC, AAROM, RICE, elevation for LE, walk every hour at least   Person(s) Educated Patient   Methods Explanation;Verbal cues   Comprehension Verbalized understanding;Need further  instruction;Verbal cues required          PT Short Term Goals - 08/21/2015 1004    PT SHORT TERM GOAL #1   Title Pt will be I with HEP for knee ROM and strength    Time 4   Period Weeks   Status New   PT SHORT TERM GOAL #2   Title Pt will be able to walk with LRAD for up to 15 min and min increase in knee pain    Time 4   Period Weeks   Status New   PT SHORT TERM GOAL #3   Title Pt will normalize ankle/calf swelling to equal to LLE   Time 4   Period Weeks   Status New           PT Long Term Goals - 08/21/2015 1007    PT LONG TERM GOAL #1   Title Pt will understand concepts of RICE and posture to prevent re-injury, pain increase.    Time 8   Period Weeks   Status New   PT LONG TERM GOAL #2   Title Pt will be able walk as needed in the community (>30 min) with no more than 5/10 pain in Rt knee.    Time 8   Period Weeks   Status New   PT LONG TERM GOAL #3   Title Pt will be able to bend Rt. knee to 110 deg in sitting with min difficulty to make completion of online school.    Time 8   Period Weeks   Status New   PT LONG TERM GOAL #4   Title FOTO score will improve to 48% or less limited in functional mobility   Baseline 74%   Time 8   Period Weeks   Status New   PT LONG TERM GOAL #5   Title Pt will be I with more advanced HEP    Time 8   Period Weeks   Status New               Plan - 08-21-2015 [id number]    Clinical Impression Statement Patient presents for low complexity evaluation of Rt. TKR done on  07/12/15.  She will benefit from skilled PT to address edema, improve gait, muscle weakness and muscle strength to maximize functional outcome.    Pt will benefit from skilled therapeutic intervention in order to improve on the following deficits Abnormal gait;Decreased range of motion;Difficulty walking;Increased fascial restricitons;Obesity;Decreased activity tolerance;Decreased skin integrity;Pain;Improper body mechanics;Impaired flexibility;Increased edema;Decreased strength;Decreased mobility;Decreased balance   Rehab Potential Good   PT Frequency 3x / week   PT Duration 8 weeks   PT Treatment/Interventions Ultrasound;ADLs/Self Care Home Management;Patient/family education;DME Instruction;Gait training;Stair training;Cryotherapy;Electrical Stimulation;Therapeutic exercise;Moist Heat;Manual techniques;Taping;Functional mobility training;Therapeutic activities;Passive range of motion;Neuromuscular re-education;Manual lymph drainage;Balance training;Vasopneumatic Device   PT Next Visit Plan address edema, review HEP/AAROM   PT Home Exercise Plan asked patient to bring in from [place], use TED hose   Consulted and Agree with Plan of Care Patient          G-Codes - 2015-08-21 1012    Functional Assessment Tool Used FOTO   Functional Limitation Mobility: Walking and moving around   Mobility: Walking and Moving Around Current Status [phone]) At least 60 percent but less than 80 percent impaired, limited or restricted   Mobility: Walking and Moving Around Goal Status [id number]) At least 40 percent but less than 60 percent impaired, limited or restricted       Problem List Patient  Active Problem List   Diagnosis Date Noted  . S/P total knee replacement 07/12/2015    Jariana Shumard 08/02/2015, 10:15 AM  [place], [place], [address] Phone: [phone]   Fax:  [phone]  Name: Anne Brewer MRN: [id number] Date of  Birth: 11/25/1970  Raeford Razor, PT 08/02/2015 10:17 AM Phone: [phone] Fax: [phone]

## 2015-08-08 ENCOUNTER — Ambulatory Visit: Payer: Medicare Other | Admitting: Physical Therapy

## 2015-08-09 ENCOUNTER — Ambulatory Visit: Payer: Medicare Other

## 2015-08-09 DIAGNOSIS — R262 Difficulty in walking, not elsewhere classified: Secondary | ICD-10-CM

## 2015-08-09 DIAGNOSIS — R600 Localized edema: Secondary | ICD-10-CM

## 2015-08-09 DIAGNOSIS — M6281 Muscle weakness (generalized): Secondary | ICD-10-CM

## 2015-08-09 DIAGNOSIS — M25661 Stiffness of right knee, not elsewhere classified: Secondary | ICD-10-CM

## 2015-08-09 NOTE — Therapy (Signed)
[place], [place], [address] Phone: [phone]   Fax:  [phone]  Physical Therapy Treatment  Patient Details  Name: Anne Brewer MRN: [id number] Date of Birth: [birth year] Referring Provider: Dr. Kathryne Hitch  Encounter Date: 08/09/2015      PT End of Session - 08/09/15 1552    Visit Number 2   Number of Visits 24   Date for PT Re-Evaluation 09/27/15   PT Start Time 0310   PT Stop Time 0405   PT Time Calculation (min) 55 min   Activity Tolerance Patient tolerated treatment well   Behavior During Therapy [facility] for tasks assessed/performed      Past Medical History  Diagnosis Date  . Arthritis   . Anemia     Past Surgical History  Procedure Laterality Date  . Abdominal surgery    . Cesarean section    . Cholecystectomy    . Laparoscopic gastric sleeve resection    . Total knee arthroplasty Right 07/12/2015    Procedure: TOTAL KNEE ARTHROPLASTY;  Surgeon: [name], MD;  Location: [facility];  Service: Orthopedics;  Laterality: Right;    There were no vitals filed for this visit.      Subjective Assessment - 08/09/15 1510    Subjective 6-7/10 . Getting better every day.             [facility] PT Assessment - 08/09/15 0001    AROM   Right Knee Extension 0   Right Knee Flexion 96  AAROM supine with strap                     [facility] Adult PT Treatment/Exercise - 08/09/15 0001    Exercises   Exercises Knee/Hip   Knee/Hip Exercises: Aerobic   Recumbent Bike 5 min  1/2 rev    Knee/Hip Exercises: Seated   Long Arc Quad 10 reps   Long Arc Quad Limitations 5 sec    Marching 10 reps   Knee/Hip Exercises: Supine   Quad Sets 10 reps   Heel Slides 10 reps   Heel Slides Limitations 10 x with strap, 10 x without strap    Straight Leg Raises AAROM;10 reps   Vasopneumatic   Number Minutes Vasopneumatic  15 minutes   Vasopnuematic Location  Knee   Vasopneumatic Pressure Low    Vasopneumatic Temperature  32                  PT Short Term Goals - 08/09/15 1546    PT SHORT TERM GOAL #1   Title Pt will be I with HEP for knee ROM and strength    Time 4   Period Weeks   Status On-going   PT SHORT TERM GOAL #2   Title Pt will be able to walk with LRAD for up to 15 min and min increase in knee pain    Time 4   Period Weeks   Status On-going   PT SHORT TERM GOAL #3   Title Pt will normalize ankle/calf swelling to equal to LLE   Time 4   Period Weeks   Status On-going           PT Long Term Goals - 08/09/15 1546    PT LONG TERM GOAL #1   Title Pt will understand concepts of RICE and posture to prevent re-injury, pain increase.    Time 8   Period Weeks   Status On-going  PT LONG TERM GOAL #2   Title Pt will be able walk as needed in the community (>30 min) with no more than 5/10 pain in Rt knee.    Time 8   Period Weeks   Status On-going   PT LONG TERM GOAL #3   Title Pt will be able to bend Rt. knee to 110 deg in sitting with min difficulty to make completion of online school.    Time 8   Period Weeks   Status On-going   PT LONG TERM GOAL #4   Title FOTO score will improve to 48% or less limited in functional mobility   Baseline 74%   Time 8   Period Weeks   Status On-going   PT LONG TERM GOAL #5   Title Pt will be I with more advanced HEP    Time 8   Period Weeks   Status On-going               Plan - 08/09/15 1537    Clinical Impression Statement Pt requires reminders to stay on task during session. Reviewed HEP and prescribed written program.  R knee AAROM flexion  improved to 0-96 degrees.    PT Next Visit Plan address edema, review HEP/AAROM and progress. follow up on HEP compliance.    PT Home Exercise Plan QS, SLR, heel slides, LAQ, AP,  marches, use TED hose   Consulted and Agree with Plan of Care Patient      Patient will benefit from skilled therapeutic intervention in order to improve the following  deficits and impairments:  Abnormal gait, Decreased range of motion, Difficulty walking, Increased fascial restricitons, Obesity, Decreased activity tolerance, Decreased skin integrity, Pain, Improper body mechanics, Impaired flexibility, Increased edema, Decreased strength, Decreased mobility, Decreased balance  Visit Diagnosis: Localized edema  Stiffness of right knee, not elsewhere classified  Difficulty in walking, not elsewhere classified  Muscle weakness (generalized)     Problem List Patient Active Problem List   Diagnosis Date Noted  . S/P total knee replacement 07/12/2015    Dollene Cleveland, PT 08/09/2015, 4:01 PM  [facility] [address] [place], [place], [address] Phone: [phone]   Fax:  [phone]  Name: Anne Brewer MRN: [id number] Date of Birth: 05-17-1970

## 2015-08-09 NOTE — Patient Instructions (Signed)
   Ankle Pump  Bend ankles up and down, alternating feet. Repeat ____ times. Do ____ sessions per day.  Quad Set  With other leg bent, foot flat, slowly tighten muscles on thigh of straight leg while counting out loud to ____. Repeat with other leg. Repeat ____ times. Do ____ sessions per day.  Hip Flexion / Knee Extension: Straight-Leg Raise (Eccentric)  Lie on back. Lift leg with knee straight. Slowly lower leg for 3-5 seconds. ___ reps per set, ___ sets per day, ___ days per week. Lower like elevator, stopping at each floor. Add ___ lbs when you achieve ___ repetitions. Rest on elbows. Rest on straight arms.  Heel Slide  Bend left knee and pull heel toward buttocks. Use sheet, strap, or rope to actively assist knee further into flexion. Hold stretch 10 secs.  Repeat ____ times. Do ____ sessions per day.  KNEE: Extension, Long Arc Quads - Sitting   Raise leg until knee is straight. ___ reps per set, ___ sets per day, ___ days per week  FLEXION: Sitting (Active)  Sit, both feet flat. Lift right knee toward ceiling. Use ___ lbs. Complete ___ sets of ___ repetitions. Perform ___ sessions per day.  Copyright  VHI. All rights reserved.   HIP: Hamstrings - Short Sitting    Rest leg on raised surface. Keep knee straight. Lift chest. Hold _30__ seconds. _3__ reps per set, _3__ sets per day, _7__ days per week  Copyright  VHI. All rights reserved.

## 2015-08-10 ENCOUNTER — Ambulatory Visit: Payer: Medicare Other | Admitting: Physical Therapy

## 2015-08-10 DIAGNOSIS — M25661 Stiffness of right knee, not elsewhere classified: Secondary | ICD-10-CM

## 2015-08-10 DIAGNOSIS — R600 Localized edema: Secondary | ICD-10-CM

## 2015-08-10 DIAGNOSIS — M6281 Muscle weakness (generalized): Secondary | ICD-10-CM

## 2015-08-10 DIAGNOSIS — R262 Difficulty in walking, not elsewhere classified: Secondary | ICD-10-CM

## 2015-08-10 NOTE — Therapy (Signed)
[place], [place], [address] Phone: [phone]   Fax:  [phone]  Physical Therapy Treatment  Patient Details  Name: Anne Brewer MRN: [id number] Date of Birth: 22-Sep-1970 Referring Provider: Dr. Kathryne Hitch  Encounter Date: 08/10/2015      PT End of Session - 08/10/15 1616    Visit Number 3   Number of Visits 24   Date for PT Re-Evaluation 09/27/15   PT Start Time 1500   PT Stop Time 1555   PT Time Calculation (min) 55 min   Activity Tolerance Patient tolerated treatment well   Behavior During Therapy [facility] for tasks assessed/performed      Past Medical History  Diagnosis Date  . Arthritis   . Anemia     Past Surgical History  Procedure Laterality Date  . Abdominal surgery    . Cesarean section    . Cholecystectomy    . Laparoscopic gastric sleeve resection    . Total knee arthroplasty Right 07/12/2015    Procedure: TOTAL KNEE ARTHROPLASTY;  Surgeon: [name], MD;  Location: [place];  Service: Orthopedics;  Laterality: Right;    There were no vitals filed for this visit.      Subjective Assessment - 08/10/15 1507    Subjective Pt reports her pain is only about a 6/10. She states it has gotten a little better. Her C/O is stiffness.    Pertinent History gastric sleeve with 284 lbs weight loss, arthritis, anemia   Limitations Sitting;Lifting;Standing;Walking;House hold activities;Other (comment)                         [facility] Adult PT Treatment/Exercise - 08/10/15 1516    Knee/Hip Exercises: Aerobic   Recumbent Bike 5 min able to make full revolution   1/2 rev    Knee/Hip Exercises: Machines for Strengthening   Cybex Leg Press 2x10 30lbs with knees at 60 degrees    Knee/Hip Exercises: Standing   Heel Raises Limitations 3x10   Lateral Step Up Limitations 4 inch step 2x10    Forward Step Up Limitations 4 inch step 2x10    Functional Squat Limitations at bar  2x10    Knee/Hip Exercises: Seated   Long Arc Quad Limitations 3x10 w/ yellow band    Hamstring Limitations 3x10 w/ yellow band                PT Education - 08/10/15 1615    Education Details updeated HEP. Educated patient on activity progression. she will try walking 1 lap on the track this weekend.    Person(s) Educated Patient   Methods Explanation;Verbal cues   Comprehension Verbalized understanding;Returned demonstration          PT Short Term Goals - 08/09/15 1546    PT SHORT TERM GOAL #1   Title Pt will be I with HEP for knee ROM and strength    Time 4   Period Weeks   Status On-going   PT SHORT TERM GOAL #2   Title Pt will be able to walk with LRAD for up to 15 min and min increase in knee pain    Time 4   Period Weeks   Status On-going   PT SHORT TERM GOAL #3   Title Pt will normalize ankle/calf swelling to equal to LLE   Time 4   Period Weeks   Status On-going  PT Long Term Goals - 08/09/15 1546    PT LONG TERM GOAL #1   Title Pt will understand concepts of RICE and posture to prevent re-injury, pain increase.    Time 8   Period Weeks   Status On-going   PT LONG TERM GOAL #2   Title Pt will be able walk as needed in the community (>30 min) with no more than 5/10 pain in Rt knee.    Time 8   Period Weeks   Status On-going   PT LONG TERM GOAL #3   Title Pt will be able to bend Rt. knee to 110 deg in sitting with min difficulty to make completion of online school.    Time 8   Period Weeks   Status On-going   PT LONG TERM GOAL #4   Title FOTO score will improve to 48% or less limited in functional mobility   Baseline 74%   Time 8   Period Weeks   Status On-going   PT LONG TERM GOAL #5   Title Pt will be I with more advanced HEP    Time 8   Period Weeks   Status On-going               Plan - 08/10/15 1618    Clinical Impression Statement Pt is making excellent progress. Her range was measured at 0-100 degrees today  with minoir pain at end range flexion. PT advnced exercises. She had no extra pain. she remains ver motviated At this time she does not require 3 visits a week. She is having difficulty  getting a ride to PT 3x per week. PT added functional strengthening with steps and squats.    Rehab Potential Good   PT Frequency 3x / week   PT Duration 8 weeks   PT Treatment/Interventions Ultrasound;ADLs/Self Care Home Management;Patient/family education;DME Instruction;Gait training;Stair training;Cryotherapy;Electrical Stimulation;Therapeutic exercise;Moist Heat;Manual techniques;Taping;Functional mobility training;Therapeutic activities;Passive range of motion;Neuromuscular re-education;Manual lymph drainage;Balance training;Vasopneumatic Device   PT Next Visit Plan address edema, review HEP/AAROM and progress. follow up on HEP compliance.  Add single leg stance if patient tolerated last visit well.    PT Home Exercise Plan QS, SLR, heel slides, LAQ, AP,  marches, use TED hose, cybex leg press, ex bike, knee flex with t-band; knee ext w/ t-band, mini Squats, heel raises    Consulted and Agree with Plan of Care Patient      Patient will benefit from skilled therapeutic intervention in order to improve the following deficits and impairments:  Abnormal gait, Decreased range of motion, Difficulty walking, Increased fascial restricitons, Obesity, Decreased activity tolerance, Decreased skin integrity, Pain, Improper body mechanics, Impaired flexibility, Increased edema, Decreased strength, Decreased mobility, Decreased balance  Visit Diagnosis: Stiffness of right knee, not elsewhere classified  Difficulty in walking, not elsewhere classified  Muscle weakness (generalized)  Localized edema     Problem List Patient Active Problem List   Diagnosis Date Noted  . S/P total knee replacement 07/12/2015    Carney Living  PT DPT   08/10/2015, 4:24 PM  [facility] [address] [place], [place], [address] Phone: [phone]   Fax:  [phone]  Name: Anne Brewer MRN: [id number] Date of Birth: 1971-01-18

## 2015-08-15 ENCOUNTER — Ambulatory Visit: Payer: Medicare Other | Admitting: Physical Therapy

## 2015-08-15 DIAGNOSIS — R600 Localized edema: Secondary | ICD-10-CM

## 2015-08-15 DIAGNOSIS — M6281 Muscle weakness (generalized): Secondary | ICD-10-CM

## 2015-08-15 DIAGNOSIS — R262 Difficulty in walking, not elsewhere classified: Secondary | ICD-10-CM

## 2015-08-15 DIAGNOSIS — M25661 Stiffness of right knee, not elsewhere classified: Secondary | ICD-10-CM

## 2015-08-15 NOTE — Therapy (Signed)
[place], [place], [address] Phone: [phone]   Fax:  [phone]  Physical Therapy Treatment  Patient Details  Name: Anne Brewer MRN: [id number] Date of Birth: 06-25-1970 Referring Provider: Dr. Kathryne Hitch  Encounter Date: 08/15/2015      PT End of Session - 08/15/15 1531    Visit Number 4   Number of Visits 24   Activity Tolerance Patient tolerated treatment well   Behavior During Therapy [facility] for tasks assessed/performed      Past Medical History  Diagnosis Date  . Arthritis   . Anemia     Past Surgical History  Procedure Laterality Date  . Abdominal surgery    . Cesarean section    . Cholecystectomy    . Laparoscopic gastric sleeve resection    . Total knee arthroplasty Right 07/12/2015    Procedure: TOTAL KNEE ARTHROPLASTY;  Surgeon: [name], MD;  Location: [place];  Service: Orthopedics;  Laterality: Right;    There were no vitals filed for this visit.      Subjective Assessment - 08/15/15 1505    Subjective Pt reports her pain today is about a 7/10. She is having medial pain. She was sore after the last visit but it has improved. She has been doing her exercises at home. She has also been able to do some sit ups and other exercises without much pain.    Pertinent History gastric sleeve with 284 lbs weight loss, arthritis, anemia   Limitations Sitting;Lifting;Standing;Walking;House hold activities;Other (comment)   Pain Score 7    Pain Location Knee   Pain Orientation Right   Pain Descriptors / Indicators Aching;Sore   Pain Onset 1 to 4 weeks ago   Multiple Pain Sites No                         [facility] Adult PT Treatment/Exercise - 08/15/15 0001    Knee/Hip Exercises: Aerobic   Recumbent Bike 5 min able to make full revolution   1/2 rev    Knee/Hip Exercises: Machines for Strengthening   Cybex Leg Press 2x10 30lbs with knees at 60 degrees     Knee/Hip Exercises: Standing   Heel Raises Limitations 3x10   Lateral Step Up Limitations 6 inch step 2x10    Forward Step Up Limitations 6 inch step 2x10    Functional Squat Limitations at bar 2x10    Knee/Hip Exercises: Seated   Long Arc Quad Limitations 3x10 w/ yellow band    Knee/Hip Exercises: Supine   Straight Leg Raises Limitations 3x10   Modalities   Modalities --  seated to R knee 10 min    Manual Therapy   Manual therapy comments passive flexion and extension. Patella mobs inferior/ superior                   PT Short Term Goals - 08/15/15 1542    PT SHORT TERM GOAL #1   Title Performing exercises at home   Baseline Pt is performing all exercises at home without cuing    Status Achieved   PT SHORT TERM GOAL #2   Title Pt will be able to walk with LRAD for up to 15 min and min increase in knee pain    Baseline Pt will begin walking tomorrow outside if able.    Time 4   Period Weeks   Status On-going   PT SHORT TERM GOAL #3  Title Pt will normalize ankle/calf swelling to equal to LLE   Baseline significant improvement in swelling not measured this visit 4-18   Time 4   Status On-going           PT Long Term Goals - 08/15/15 1553    PT LONG TERM GOAL #2   Title Pt will be able walk as needed in the community (>30 min) with no more than 5/10 pain in Rt knee.    Time 8   Period Weeks   Status On-going   PT LONG TERM GOAL #3   Title Pt will be able to bend Rt. knee to 110 deg in sitting with min difficulty to make completion of online school.    Baseline 107 degrees measured 4-18   Time 8   Period Weeks   Status On-going      Manual therapy: patellar mobs to improve superior/inferior glide. PROM into flexion.  There-ex: performed to improve stability and strength per flow sheet.          Plan - 08/15/15 1531    Clinical Impression Statement Therapy added in single leg stance today. The patient required upper extremity assistance but over  all tolerated well.  Her flexion was measured at 107 degrees with pain at end range. she is progressing towards all goals. PT was able to raise step height to 6 inches. She was sore after treatment but not much more then pre-treatment .    Rehab Potential Good   Clinical Impairments Affecting Rehab Potential --   PT Frequency 3x / week   PT Duration 8 weeks   PT Treatment/Interventions Ultrasound;ADLs/Self Care Home Management;Patient/family education;DME Instruction;Gait training;Stair training;Cryotherapy;Electrical Stimulation;Therapeutic exercise;Moist Heat;Manual techniques;Taping;Functional mobility training;Therapeutic activities;Passive range of motion;Neuromuscular re-education;Manual lymph drainage;Balance training;Vasopneumatic Device   PT Home Exercise Plan QS, SLR, heel slides, LAQ, AP,  marches, use TED hose, cybex leg press, ex bike, knee flex with t-band; knee ext w/ t-band, mini Squats, heel raises       Patient will benefit from skilled therapeutic intervention in order to improve the following deficits and impairments:  Abnormal gait, Decreased range of motion, Difficulty walking, Increased fascial restricitons, Obesity, Decreased activity tolerance, Decreased skin integrity, Pain, Improper body mechanics, Impaired flexibility, Increased edema, Decreased strength, Decreased mobility, Decreased balance  Visit Diagnosis: Muscle weakness (generalized)  Stiffness of right knee, not elsewhere classified  Localized edema  Difficulty in walking, not elsewhere classified     Problem List Patient Active Problem List   Diagnosis Date Noted  . S/P total knee replacement 07/12/2015    Carney Living PT DPT  08/15/2015, 3:56 PM  [facility] [address] [place], [place], [address] Phone: [phone]   Fax:  [phone]  Name: LYNNAYA LAPPIN MRN: [id number] Date of Birth: 12/11/1970

## 2015-08-16 ENCOUNTER — Encounter: Payer: Medicare Other | Admitting: Physical Therapy

## 2015-08-17 ENCOUNTER — Ambulatory Visit: Payer: Medicare Other | Admitting: Physical Therapy

## 2015-08-17 DIAGNOSIS — R600 Localized edema: Secondary | ICD-10-CM

## 2015-08-17 DIAGNOSIS — M25661 Stiffness of right knee, not elsewhere classified: Secondary | ICD-10-CM

## 2015-08-17 DIAGNOSIS — M6281 Muscle weakness (generalized): Secondary | ICD-10-CM

## 2015-08-17 DIAGNOSIS — R262 Difficulty in walking, not elsewhere classified: Secondary | ICD-10-CM

## 2015-08-17 NOTE — Therapy (Signed)
[place], [place], [address] Phone: [phone]   Fax:  [phone]  Physical Therapy Treatment  Patient Details  Name: Anne Brewer MRN: [id number] Date of Birth: [birth year] Referring Provider: Dr. Kathryne Hitch  Encounter Date: 08/17/2015      PT End of Session - 08/17/15 1625    Visit Number 5   Number of Visits 24   Date for PT Re-Evaluation 09/27/15   PT Start Time 1400   PT Stop Time 1425   PT Time Calculation (min) 25 min   Activity Tolerance Patient limited by pain   Behavior During Therapy [facility] for tasks assessed/performed      Past Medical History  Diagnosis Date  . Arthritis   . Anemia     Past Surgical History  Procedure Laterality Date  . Abdominal surgery    . Cesarean section    . Cholecystectomy    . Laparoscopic gastric sleeve resection    . Total knee arthroplasty Right 07/12/2015    Procedure: TOTAL KNEE ARTHROPLASTY;  Surgeon: [name], MD;  Location: [place];  Service: Orthopedics;  Laterality: Right;    There were no vitals filed for this visit.      Subjective Assessment - 08/17/15 1601    Subjective Pt reports her pain is going down. Her pain is going down despite reporting a 6/10 pain. She continues to take her pain medication. She also reports blood in her urine. Therapy advised the patient to contact her doctor as soon as able. Pt was 15 min late for her appointment.                          [facility] Adult PT Treatment/Exercise - 08/17/15 0001    Knee/Hip Exercises: Aerobic   Recumbent Bike 5 min able to make full revolution   1/2 rev    Knee/Hip Exercises: Standing   Heel Raises Limitations 3x10   Lateral Step Up Limitations 6 inch step 2x10    Forward Step Up Limitations 6 inch step 2x10    Functional Squat Limitations at bar 2x10                   PT Short Term Goals - 08/15/15 1542    PT SHORT TERM GOAL #1   Title  Performing exercises at home   Baseline Pt is performing all exercises at home without cuing    Status Achieved   PT SHORT TERM GOAL #2   Title Pt will be able to walk with LRAD for up to 15 min and min increase in knee pain    Baseline Pt will begin walking tomorrow outside if able.    Time 4   Period Weeks   Status On-going   PT SHORT TERM GOAL #3   Title Pt will normalize ankle/calf swelling to equal to LLE   Baseline significant improvement in swelling not measured this visit 4-18   Time 4   Status On-going           PT Long Term Goals - 08/15/15 1553    PT LONG TERM GOAL #2   Title Pt will be able walk as needed in the community (>30 min) with no more than 5/10 pain in Rt knee.    Time 8   Period Weeks   Status On-going   PT LONG TERM GOAL #3   Title Pt will be able to bend Rt.  knee to 110 deg in sitting with min difficulty to make completion of online school.    Baseline 107 degrees measured 4-18   Time 8   Period Weeks   Status On-going               Plan - 08/17/15 1626    Clinical Impression Statement Pt is making good progress at this time. Her range was measured at 0-108 degrees. He strength is improving but her hip flexion (4/5) and knee extesnion 4/5 are still limited. She requrested to leave treatment 10 min early 2nd to her ride. she would benefit from furter PT but she reports she will only be able to come 1x a week 2nd to gas and her ride. She will continue 1x a week for 4 more weeks.     Rehab Potential Good   PT Frequency 3x / week   PT Duration 8 weeks   PT Treatment/Interventions Ultrasound;ADLs/Self Care Home Management;Patient/family education;DME Instruction;Gait training;Stair training;Cryotherapy;Electrical Stimulation;Therapeutic exercise;Moist Heat;Manual techniques;Taping;Functional mobility training;Therapeutic activities;Passive range of motion;Neuromuscular re-education;Manual lymph drainage;Balance training;Vasopneumatic Device   PT  Next Visit Plan address edema, review HEP/AAROM and progress. follow up on HEP compliance.  Add single leg stance if patient tolerated last visit well.    PT Home Exercise Plan QS, SLR, heel slides, LAQ, AP,  marches, use TED hose, cybex leg press, ex bike, knee flex with t-band; knee ext w/ t-band, mini Squats, heel raises     Manual therapy: to improve right knee flexion. Limited by short treatment session. There-ex: to improve knee strength   Patient will benefit from skilled therapeutic intervention in order to improve the following deficits and impairments:  Abnormal gait  Visit Diagnosis: Muscle weakness (generalized)  Stiffness of right knee, not elsewhere classified  Localized edema  Difficulty in walking, not elsewhere classified     Problem List Patient Active Problem List   Diagnosis Date Noted  . S/P total knee replacement 07/12/2015    Carney Living PT DPT  08/17/2015, 4:32 PM  [facility] [address] [place], [place], [address] Phone: [phone]   Fax:  [phone]  Name: CHERENE ROSEMANN MRN: [id number] Date of Birth: 09/14/1970

## 2015-08-22 ENCOUNTER — Ambulatory Visit: Payer: Medicare Other | Admitting: Physical Therapy

## 2015-08-23 ENCOUNTER — Encounter: Payer: Medicare Other | Admitting: Physical Therapy

## 2015-08-24 ENCOUNTER — Ambulatory Visit: Payer: Medicare Other | Admitting: Physical Therapy

## 2015-08-31 ENCOUNTER — Other Ambulatory Visit: Payer: Self-pay | Admitting: Specialist

## 2015-08-31 DIAGNOSIS — M8580 Other specified disorders of bone density and structure, unspecified site: Secondary | ICD-10-CM

## 2015-09-04 ENCOUNTER — Ambulatory Visit: Payer: Medicare Other | Attending: Orthopedic Surgery | Admitting: Physical Therapy

## 2015-09-04 DIAGNOSIS — R262 Difficulty in walking, not elsewhere classified: Secondary | ICD-10-CM | POA: Diagnosis present

## 2015-09-04 DIAGNOSIS — M25661 Stiffness of right knee, not elsewhere classified: Secondary | ICD-10-CM | POA: Diagnosis present

## 2015-09-04 DIAGNOSIS — M6281 Muscle weakness (generalized): Secondary | ICD-10-CM | POA: Diagnosis present

## 2015-09-04 DIAGNOSIS — R600 Localized edema: Secondary | ICD-10-CM | POA: Diagnosis present

## 2015-09-04 NOTE — Therapy (Signed)
[place], [place], [address] Phone: [phone]   Fax:  [phone]  Physical Therapy Treatment  Patient Details  Name: Anne Brewer MRN: [id number] Date of Birth: May 02, 1970 Referring Provider: Dr. Kathryne Hitch  Encounter Date: 09/04/2015      PT End of Session - 09/04/15 1553    Visit Number 6   Number of Visits 24   Date for PT Re-Evaluation 09/27/15   PT Start Time 1500   PT Stop Time 1545   PT Time Calculation (min) 45 min   Activity Tolerance Patient limited by pain   Behavior During Therapy [facility] for tasks assessed/performed      Past Medical History  Diagnosis Date  . Arthritis   . Anemia     Past Surgical History  Procedure Laterality Date  . Abdominal surgery    . Cesarean section    . Cholecystectomy    . Laparoscopic gastric sleeve resection    . Total knee arthroplasty Right 07/12/2015    Procedure: TOTAL KNEE ARTHROPLASTY;  Surgeon: [name], MD;  Location: [place];  Service: Orthopedics;  Laterality: Right;    There were no vitals filed for this visit.      Subjective Assessment - 09/04/15 1524    Subjective Patient has been having difficulty getting to therapy because of her ride. She has been doing her exercises at home. She continues to have medial knee pain reaching a 6/10    Pertinent History gastric sleeve with 284 lbs weight loss, arthritis, anemia   Limitations Sitting;Lifting;Standing;Walking;House hold activities;Other (comment)   How long can you stand comfortably? <5 min pain increases, dizzy   How long can you walk comfortably? <5 min pain, dizzy   Diagnostic tests post surgical    Patient Stated Goals pain relief, keep going to school   Pain Score 7    Pain Location Knee   Pain Orientation Right   Pain Descriptors / Indicators Aching;Sore   Pain Onset 1 to 4 weeks ago   Pain Frequency Constant                         [facility] Adult  PT Treatment/Exercise - 09/04/15 0001    Knee/Hip Exercises: Aerobic   Recumbent Bike 5 min able to make full revolution   1/2 rev    Knee/Hip Exercises: Machines for Strengthening   Cybex Knee Extension 2x1- 15lb    Cybex Leg Press 2x10 30lbs with knees at 60 degrees    Knee/Hip Exercises: Standing   Heel Raises Limitations 3x10   Lateral Step Up Limitations 8 inch step 2x10    Forward Step Up Limitations 8 inch step 2x10    Functional Squat Limitations at bar 2x10    Other Standing Knee Exercises single leg stance 3x20 sec hold   Knee/Hip Exercises: Supine   Straight Leg Raises Limitations 3x10   Manual Therapy   Manual therapy comments passive flexion and extension. Patella mobs inferior/ superior                 PT Education - 09/04/15 1552    Education Details Educated the patient on gym exercise progression    Person(s) Educated Patient   Methods Explanation;Verbal cues   Comprehension Verbalized understanding;Returned demonstration          PT Short Term Goals - 09/04/15 1556    PT SHORT TERM GOAL #1   Title Performing exercises at  home   Baseline Pt is performing all exercises at home without cuing    Time 4   Period Weeks   Status Achieved   PT SHORT TERM GOAL #2   Title Pt will be able to walk with LRAD for up to 15 min and min increase in knee pain    Baseline Pt will begin walking tomorrow outside if able.    Time 4   Period Weeks   Status Achieved   PT SHORT TERM GOAL #3   Title Pt will normalize ankle/calf swelling to equal to LLE   Baseline significant improvement in swelling not measured this visit 4-18   Time 4   Period Weeks   Status Achieved           PT Long Term Goals - 09/04/15 1557    PT LONG TERM GOAL #1   Title Pt will understand concepts of RICE and posture to prevent re-injury, pain increase.    PT LONG TERM GOAL #2   Title Pt will be able walk as needed in the community (>30 min) with no more than 5/10 pain in Rt knee.     Time 8   Period Weeks   Status On-going   PT LONG TERM GOAL #3   Title Pt will be able to bend Rt. knee to 110 deg in sitting with min difficulty to make completion of online school.    Baseline 107 degrees measured 4-18   Time 8   Period Weeks   Status On-going   PT LONG TERM GOAL #4   Title FOTO score will improve to 48% or less limited in functional mobility   Baseline 74%   Time 8   Period Weeks   Status On-going   PT LONG TERM GOAL #5   Title Pt will be I with more advanced HEP    Time 8   Period Weeks   Status On-going               Plan - 09/04/15 1554    Clinical Impression Statement Pt continues to make progress but ahe is still having pain and swelling. The patient would benefit from further therapy but she is having money and ride issues. She hopes to come one ore time at the end of the month. She was advised her certification is up ion 09/27/15.    Rehab Potential Good   PT Frequency 3x / week   PT Duration 8 weeks   PT Treatment/Interventions Ultrasound;ADLs/Self Care Home Management;Patient/family education;DME Instruction;Gait training;Stair training;Cryotherapy;Electrical Stimulation;Therapeutic exercise;Moist Heat;Manual techniques;Taping;Functional mobility training;Therapeutic activities;Passive range of motion;Neuromuscular re-education;Manual lymph drainage;Balance training;Vasopneumatic Device   PT Next Visit Plan address edema, review HEP/AAROM and progress. follow up on HEP compliance.  Add single leg stance if patient tolerated last visit well.    PT Home Exercise Plan QS, SLR, heel slides, LAQ, AP,  marches, use TED hose, cybex leg press, ex bike, knee flex with t-band; knee ext w/ t-band, mini Squats, heel raises    Consulted and Agree with Plan of Care Patient      Patient will benefit from skilled therapeutic intervention in order to improve the following deficits and impairments:  Abnormal gait  Visit Diagnosis: Muscle weakness  (generalized)  Stiffness of right knee, not elsewhere classified  Localized edema  Difficulty in walking, not elsewhere classified  There-ex: to improve stregth  Manual therapy: to improve ROM and decrease pain    Problem List Patient Active Problem List   Diagnosis  Date Noted  . S/P total knee replacement 07/12/2015    Carney Living PT DPT  09/04/2015, 4:00 PM  [place], [place], [address] Phone: [phone]   Fax:  [phone]  Name: Anne Brewer MRN: [id number] Date of Birth: [birth year]

## 2015-09-14 ENCOUNTER — Other Ambulatory Visit: Payer: Medicare Other

## 2015-09-21 ENCOUNTER — Ambulatory Visit: Payer: Medicare Other | Admitting: Physical Therapy

## 2015-09-21 DIAGNOSIS — M25661 Stiffness of right knee, not elsewhere classified: Secondary | ICD-10-CM

## 2015-09-21 DIAGNOSIS — M6281 Muscle weakness (generalized): Secondary | ICD-10-CM | POA: Diagnosis not present

## 2015-09-21 DIAGNOSIS — R600 Localized edema: Secondary | ICD-10-CM

## 2015-09-21 DIAGNOSIS — R262 Difficulty in walking, not elsewhere classified: Secondary | ICD-10-CM

## 2015-09-22 NOTE — Therapy (Signed)
[place], [place], [address] Phone: [phone]   Fax:  [phone]  Physical Therapy Discharge  Patient Details  Name: Anne Brewer MRN: [id number] Date of Birth: 09-26-1970 Referring Provider: Dr. Kathryne Hitch  Encounter Date: 09/21/2015      PT End of Session - 09/21/15 1338    Visit Number 7   Number of Visits 24   Date for PT Re-Evaluation 09/27/15   PT Start Time 1330   PT Stop Time 1410   PT Time Calculation (min) 40 min   Activity Tolerance Patient tolerated treatment well   Behavior During Therapy [facility] for tasks assessed/performed      Past Medical History  Diagnosis Date  . Arthritis   . Anemia     Past Surgical History  Procedure Laterality Date  . Abdominal surgery    . Cesarean section    . Cholecystectomy    . Laparoscopic gastric sleeve resection    . Total knee arthroplasty Right 07/12/2015    Procedure: TOTAL KNEE ARTHROPLASTY;  Surgeon: [name], MD;  Location: [place];  Service: Orthopedics;  Laterality: Right;    There were no vitals filed for this visit.      Subjective Assessment - 09/21/15 1335    Subjective Patient has not been seen for 3 weeks. She has been doing her exercises on her own. She has had pain in her medial knee. Her pain is reaching about a 6/10. She feels like it is getting better. She did a lot of walking yesterday.    Pertinent History gastric sleeve with 284 lbs weight loss, arthritis, anemia   Limitations Sitting;Lifting;Standing;Walking;House hold activities;Other (comment)   How long can you stand comfortably? <5 min pain increases, dizzy   How long can you walk comfortably? <5 min pain, dizzy   Diagnostic tests post surgical    Patient Stated Goals pain relief, keep going to school   Pain Score 6    Pain Location Knee   Pain Orientation Right   Pain Descriptors / Indicators Aching   Pain Type Surgical pain   Pain Onset 1 to 4 weeks  ago   Pain Frequency Constant        Self: care ducated patient on home mangement and progression of activity at home.                          PT Education - 09/21/15 1338    Education provided Yes   Education Details Updated HEP. Educated the patient on the importance of continuing exercises.    Person(s) Educated Patient   Methods Explanation;Demonstration   Comprehension Verbalized understanding;Returned demonstration          PT Short Term Goals - 09/04/15 1556    PT SHORT TERM GOAL #1   Title Performing exercises at home   Baseline Pt is performing all exercises at home without cuing    Time 4   Period Weeks   Status Achieved   PT SHORT TERM GOAL #2   Title Pt will be able to walk with LRAD for up to 15 min and min increase in knee pain    Baseline Pt will begin walking tomorrow outside if able.    Time 4   Period Weeks   Status Achieved   PT SHORT TERM GOAL #3   Title Pt will normalize ankle/calf swelling to equal to LLE   Baseline significant improvement in  swelling not measured this visit 4-18   Time 4   Period Weeks   Status Achieved           PT Long Term Goals - 09/21/15 1359    PT LONG TERM GOAL #1   Title Pt will understand concepts of RICE and posture to prevent re-injury, pain increase.    Baseline ale to re-state the importance of ice    Time 8   Period Weeks   Status Achieved   PT LONG TERM GOAL #2   Title Pt will be able walk as needed in the community (>30 min) with no more than 5/10 pain in Rt knee.    Time 8   Period Weeks   Status Achieved   PT LONG TERM GOAL #3   Title Pt will be able to bend Rt. knee to 110 deg in sitting with min difficulty to make completion of online school.    Baseline 0-118   Time 8   Period Weeks   Status On-going   PT LONG TERM GOAL #4   Title FOTO score will improve to 48% or less limited in functional mobility   Time 8   Period Weeks   Status Achieved                Plan - 09/21/15 1352    Clinical Impression Statement At this point the patient has reached all goals for therapy. Her range was measured from 0-118 degrees. She is still having some pain but it is improving. She was encouraged to continue her hexercises at home. She has been given a full HEP. D/C  to HEP.    Rehab Potential Good   PT Treatment/Interventions Ultrasound;ADLs/Self Care Home Management;Patient/family education;DME Instruction;Gait training;Stair training;Cryotherapy;Electrical Stimulation;Therapeutic exercise;Moist Heat;Manual techniques;Taping;Functional mobility training;Therapeutic activities;Passive range of motion;Neuromuscular re-education;Manual lymph drainage;Balance training;Vasopneumatic Device   PT Next Visit Plan address edema, review HEP/AAROM and progress. follow up on HEP compliance.  Add single leg stance if patient tolerated last visit well.    PT Home Exercise Plan QS, SLR, heel slides, LAQ, AP,  marches, use TED hose, cybex leg press, ex bike, knee flex with t-band; knee ext w/ t-band, mini Squats, heel raises       Patient will benefit from skilled therapeutic intervention in order to improve the following deficits and impairments:  Abnormal gait, Pain  Visit Diagnosis: Muscle weakness (generalized)  Stiffness of right knee, not elsewhere classified  Localized edema  Difficulty in walking, not elsewhere classified  PHYSICAL THERAPY DISCHARGE SUMMARY  Visits from Start of Care: 7  Current functional level related to goals / functional outcomes: Patient has reached all goals for therapy. She continues to have some medial knee pain but she was encouraged to continue strengthening.    Remaining deficits: Medial knee pain   Education / Equipment: HEP given. Patient encouraged to continue flexion stretching Plan: Patient agrees to discharge.  Patient goals were met. Patient is being discharged due to meeting the stated rehab goals.  ?????        Problem List Patient Active Problem List   Diagnosis Date Noted  . S/P total knee replacement 07/12/2015    Carney Living PT DPT  09/22/2015, 10:25 AM  [place], [place], [address] Phone: [phone]   Fax:  [phone]  Name: Anne Brewer MRN: [id number] Date of Birth: 12-01-1970

## 2015-09-26 ENCOUNTER — Other Ambulatory Visit: Payer: Self-pay | Admitting: Specialist

## 2015-09-26 DIAGNOSIS — Z1231 Encounter for screening mammogram for malignant neoplasm of breast: Secondary | ICD-10-CM

## 2015-10-06 ENCOUNTER — Ambulatory Visit
Admission: RE | Admit: 2015-10-06 | Discharge: 2015-10-06 | Disposition: A | Discharge status: Home / Self Care | Payer: Medicare Other | Source: Ambulatory Visit | Attending: Specialist | Admitting: Specialist

## 2015-10-06 ENCOUNTER — Inpatient Hospital Stay: Admission: RE | Admit: 2015-10-06 | Payer: Medicare Other | Source: Ambulatory Visit

## 2015-10-06 DIAGNOSIS — Z1231 Encounter for screening mammogram for malignant neoplasm of breast: Secondary | ICD-10-CM

## 2015-11-14 ENCOUNTER — Inpatient Hospital Stay: Admission: RE | Admit: 2015-11-14 | Payer: Medicare Other | Source: Ambulatory Visit

## 2015-11-28 ENCOUNTER — Ambulatory Visit
Admission: RE | Admit: 2015-11-28 | Discharge: 2015-11-28 | Disposition: A | Discharge status: Home / Self Care | Payer: Medicare Other | Source: Ambulatory Visit | Attending: Specialist | Admitting: Specialist

## 2015-11-28 DIAGNOSIS — M8580 Other specified disorders of bone density and structure, unspecified site: Secondary | ICD-10-CM

## 2015-12-19 ENCOUNTER — Encounter: Payer: Self-pay | Admitting: Obstetrics & Gynecology

## 2015-12-22 ENCOUNTER — Encounter: Payer: Self-pay | Admitting: *Deleted

## 2016-01-24 ENCOUNTER — Encounter: Payer: Medicare Other | Admitting: Obstetrics & Gynecology

## 2016-01-26 ENCOUNTER — Emergency Department (HOSPITAL_COMMUNITY): Payer: Medicare Other

## 2016-01-26 ENCOUNTER — Emergency Department (HOSPITAL_COMMUNITY)
Admission: EM | Admit: 2016-01-26 | Discharge: 2016-01-27 | Disposition: A | Discharge status: Home / Self Care | Payer: Medicare Other | Attending: Emergency Medicine | Admitting: Emergency Medicine

## 2016-01-26 ENCOUNTER — Encounter (HOSPITAL_COMMUNITY): Payer: Self-pay | Admitting: *Deleted

## 2016-01-26 DIAGNOSIS — F17210 Nicotine dependence, cigarettes, uncomplicated: Secondary | ICD-10-CM | POA: Diagnosis not present

## 2016-01-26 DIAGNOSIS — J069 Acute upper respiratory infection, unspecified: Secondary | ICD-10-CM | POA: Diagnosis present

## 2016-01-26 DIAGNOSIS — R059 Cough, unspecified: Secondary | ICD-10-CM

## 2016-01-26 DIAGNOSIS — Z96651 Presence of right artificial knee joint: Secondary | ICD-10-CM | POA: Insufficient documentation

## 2016-01-26 DIAGNOSIS — Z7901 Long term (current) use of anticoagulants: Secondary | ICD-10-CM | POA: Insufficient documentation

## 2016-01-26 DIAGNOSIS — R05 Cough: Secondary | ICD-10-CM

## 2016-01-26 LAB — BASIC METABOLIC PANEL
ANION GAP: 14 (ref 5–15)
BUN: 21 mg/dL — AB (ref 6–20)
CHLORIDE: 98 mmol/L — AB (ref 101–111)
CO2: 20 mmol/L — ABNORMAL LOW (ref 22–32)
Calcium: 8.7 mg/dL — ABNORMAL LOW (ref 8.9–10.3)
Creatinine, Ser: 1.26 mg/dL — ABNORMAL HIGH (ref 0.44–1.00)
GFR, EST AFRICAN AMERICAN: 58 mL/min — AB (ref >60)
GFR, EST NON AFRICAN AMERICAN: 50 mL/min — AB (ref >60)
Glucose, Bld: 77 mg/dL (ref 65–99)
POTASSIUM: 3.6 mmol/L (ref 3.5–5.1)
SODIUM: 132 mmol/L — AB (ref 135–145)

## 2016-01-26 LAB — CBC WITH DIFFERENTIAL/PLATELET
BASOS ABS: 0 10*3/uL (ref 0.0–0.1)
Basophils Relative: 0 %
EOS ABS: 0.2 10*3/uL (ref 0.0–0.7)
EOS PCT: 3 %
HCT: 31.3 % — ABNORMAL LOW (ref 36.0–46.0)
HEMOGLOBIN: 9.6 g/dL — AB (ref 12.0–15.0)
LYMPHS ABS: 2.9 10*3/uL (ref 0.7–4.0)
LYMPHS PCT: 32 %
MCH: 26.7 pg (ref 26.0–34.0)
MCHC: 30.7 g/dL (ref 30.0–36.0)
MCV: 87.2 fL (ref 78.0–100.0)
Monocytes Absolute: 0.5 10*3/uL (ref 0.1–1.0)
Monocytes Relative: 6 %
NEUTROS PCT: 59 %
Neutro Abs: 5.4 10*3/uL (ref 1.7–7.7)
PLATELETS: 353 10*3/uL (ref 150–400)
RBC: 3.59 MIL/uL — AB (ref 3.87–5.11)
RDW: 15.5 % (ref 11.5–15.5)
WBC: 9 10*3/uL (ref 4.0–10.5)

## 2016-01-26 LAB — HCG, QUANTITATIVE, PREGNANCY: HCG, BETA CHAIN, QUANT, S: 1 m[IU]/mL (ref <5)

## 2016-01-26 LAB — I-STAT BETA HCG BLOOD, ED (MC, WL, AP ONLY)

## 2016-01-26 NOTE — ED Triage Notes (Signed)
The pt reports that she is not feeling well  She has body aches  Cough  And she is aggravated for personal reasons.  She has had for 2 days  lmp sept 2st

## 2016-01-26 NOTE — ED Provider Notes (Signed)
[facility] DEPT Provider Note   CSN: [id number] Arrival date & time: 01/26/16  1742     History   Chief Complaint Chief Complaint  Patient presents with  . Generalized Body Aches    HPI Anne Brewer is a 45 y.o. female with a past medical history significant for anemia presents with congestion, rhinorrhea, chills, and cough. Patient reports that she has been having chills for the last week and then over the last 3 days, developed a cough. Patient says that she has had a clear production of her cough. She denies hemoptysis. She reports that she has not taken her temperature. She reports no rest pain, shortness of breath, nausea, vomiting, constipation, diarrhea, dysuria. She denies any other symptoms on arrival. She does report increase in stress at home but denies any SI or HI.  The history is provided by the patient and medical records. No language interpreter was used.  URI   This is a new problem. The current episode started more than 2 days ago. The problem has not changed since onset.There has been no fever. Associated symptoms include congestion, rhinorrhea and cough. Pertinent negatives include no chest pain, no abdominal pain, no diarrhea, no nausea, no vomiting, no dysuria, no headaches, no sneezing, no joint pain, no joint swelling, no neck pain and no wheezing. She has tried nothing for the symptoms. The treatment provided no relief.    Past Medical History:  Diagnosis Date  . Anemia   . Arthritis     Patient Active Problem List   Diagnosis Date Noted  . 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 ABDOMINAL SURGERY    . CESAREAN SECTION    . CHOLECYSTECTOMY    . LAPAROSCOPIC GASTRIC SLEEVE RESECTION    . TOTAL KNEE ARTHROPLASTY Right 07/12/2015   Procedure: TOTAL KNEE ARTHROPLASTY;  Surgeon: [name], MD;  Location: [facility];  Service: Orthopedics;  Laterality: Right;    OB History    No data  available       Home Medications    Prior to Admission medications   Medication Sig Start Date End Date Taking? Authorizing Provider  apixaban (ELIQUIS) 2.5 MG TABS tablet Take 1 tab po q12 hours x 14 days following surgery to prevent blood clots 07/12/15   Aundra Dubin, PA-C  BIOTIN PO Take 2 tablets by mouth daily. Reported on 08/02/2015    Historical Provider, MD  bisacodyl (DULCOLAX) 5 MG EC tablet Take 1 tablet (5 mg total) by mouth daily as needed for moderate constipation. 07/12/15   Aundra Dubin, PA-C  hydrOXYzine (ATARAX/VISTARIL) 25 MG tablet Take 1 tablet (25 mg total) by mouth every 6 (six) hours as needed for itching. Patient not taking: Reported on 08/02/2015 04/04/15   Melony Overly, MD  methocarbamol (ROBAXIN) 500 MG tablet Take 1 tablet (500 mg total) by mouth 4 (four) times daily. 07/12/15   Aundra Dubin, PA-C  Multiple Vitamins-Minerals (MULTI COMPLETE PO) Take by mouth daily.     Historical Provider, MD  ondansetron (ZOFRAN) 4 MG tablet Take 1 tablet (4 mg total) by mouth every 8 (eight) hours as needed for nausea or vomiting. 07/12/15   Aundra Dubin, PA-C  oxyCODONE-acetaminophen (ROXICET) 5-325 MG tablet Take 1-2 tablets by mouth every 4 (four) hours as needed. 07/12/15   Aundra Dubin, PA-C  vitamin B-12 (CYANOCOBALAMIN) 1000 MCG tablet Take 1,000 mcg by mouth 2 (two) times daily.  Historical Provider, MD    Family History No family history on file.  Social History Social History  Substance Use Topics  . Smoking status: Current Every Day Smoker    Packs/day: 0.25    Years: 2.00    Types: Cigarettes  . Smokeless tobacco: Never Used  . Alcohol use Yes     Comment: occasionally     Allergies   Shellfish allergy and Lactose intolerance (gi)   Review of Systems Review of Systems  Constitutional: Positive for chills. Negative for diaphoresis, fatigue and fever.  HENT: Positive for congestion and rhinorrhea. Negative for drooling and sneezing.     Respiratory: Positive for cough. Negative for chest tightness, shortness of breath, wheezing and stridor.   Cardiovascular: Negative for chest pain and palpitations.  Gastrointestinal: Negative for abdominal pain, constipation, diarrhea, nausea and vomiting.  Genitourinary: Negative for decreased urine volume, dysuria, flank pain, frequency and vaginal bleeding.  Musculoskeletal: Negative for back pain, joint pain, neck pain and neck stiffness.  Skin: Negative for wound.  Neurological: Negative for weakness, light-headedness and headaches.  Psychiatric/Behavioral: Negative for agitation, confusion and suicidal ideas.  All other systems reviewed and are negative.    Physical Exam Updated Vital Signs BP 125/73   Pulse 80   Temp 98.7 F (37.1 C) (Oral)   Resp 18   Ht 5\' 8"  (1.727 m)   Wt 207 lb 9 oz (94.1 kg)   LMP 01/01/2016   SpO2 100%   BMI 31.56 kg/m   Physical Exam  Constitutional: She is oriented to person, place, and time. She appears well-developed and well-nourished. No distress.  HENT:  Head: Normocephalic and atraumatic.  Mouth/Throat: Oropharynx is clear and moist. No oropharyngeal exudate.  Eyes: Conjunctivae are normal.  Neck: Neck supple.  Cardiovascular: Normal rate and regular rhythm.   No murmur heard. Pulmonary/Chest: Effort normal and breath sounds normal. No respiratory distress. She exhibits no tenderness.  Abdominal: Soft. There is no tenderness.  Musculoskeletal: She exhibits no edema or tenderness.  Neurological: She is alert and oriented to person, place, and time. She has normal reflexes. She displays normal reflexes. No cranial nerve deficit. She exhibits normal muscle tone. Coordination normal.  Skin: Skin is warm and dry. Capillary refill takes less than 2 seconds.  Psychiatric: She has a normal mood and affect. Her speech is tangential. She is not agitated and not hyperactive. She expresses no homicidal and no suicidal ideation. She is  communicative. She is attentive.  Nursing note and vitals reviewed.    ED Treatments / Results  Labs (all labs ordered are listed, but only abnormal results are displayed) Labs Reviewed  CBC WITH DIFFERENTIAL/PLATELET - Abnormal; Notable for the following:       Result Value   RBC 3.59 (*)    Hemoglobin 9.6 (*)    HCT 31.3 (*)    All other components within normal limits  BASIC METABOLIC PANEL - Abnormal; Notable for the following:    Sodium 132 (*)    Chloride 98 (*)    CO2 20 (*)    BUN 21 (*)    Creatinine, Ser 1.26 (*)    Calcium 8.7 (*)    GFR calc non Af Amer 50 (*)    GFR calc Af Amer 58 (*)    All other components within normal limits  HCG, QUANTITATIVE, PREGNANCY  I-STAT BETA HCG BLOOD, ED ([facility], [facility], [facility] ONLY)    EKG  EKG Interpretation None       Radiology  Dg Chest 2 View  Result Date: 01/27/2016 CLINICAL DATA:  45 y/o F; chest pain, productive cough, chills, and sweats. EXAM: CHEST  2 VIEW COMPARISON:  None. FINDINGS: The heart size and mediastinal contours are within normal limits. Both lungs are clear. The visualized skeletal structures are unremarkable. IMPRESSION: No active cardiopulmonary disease. Electronically Signed   By: Kristine Garbe M.D.   On: 01/27/2016 00:13    Procedures Procedures (including critical care time)  Medications Ordered in ED Medications - No data to display   Initial Impression / Assessment and Plan / ED Course  I have reviewed the triage vital signs and the nursing notes.  Pertinent labs & imaging results that were available during my care of the patient were reviewed by me and considered in my medical decision making (see chart for details).  Clinical Course   Anne Brewer is a 45 y.o. female with a past medical history significant for anemia presents with congestion, rhinorrhea, chills, and cough. History and exam are seen above. Patient was tangential in her conversation however, after redirection, patient  was able to describe her primary complaint being URI-like symptoms. She reported a mild cough with associated rhinorrhea congestion and chills. Her primary concern was pneumonia.  Lungs were clear on exam. Given the productive cough and reported chills, laboratory and imaging tests were ordered to investigate.  No evidence of pneumonia on imaging. No evidence of leukocytosis. Hemoglobin improved from prior. Patient was found to have some electrolyte abnormalities including a creatinine that was increased from prior. Patient was informed of this and told this is likely representing kidney injury. She was instructed to stay hydrated. Patient was able to eat and drink without difficulty in the exam room and drank a whole cup of fluids. Given patient's tolerance of by mouth intake, patient felt appropriate for oral rehydration at home for her elevated creatinine. Patient instructed to follow-up with PCP for further monitoring management of her kidney function.   Patient reassured that her symptoms likely secondary to URI rather than PNA. Patient denied SI and HI on assessment. Patient understood workup and agreed with plan of discharge. She reports she will follow-up with PCP.   Patient had no other questions or concerns and patient was discharged in good condition.    Final Clinical Impressions(s) / ED Diagnoses   Final diagnoses:  Cough  URI (upper respiratory infection)    New Prescriptions Discharge Medication List as of 01/27/2016 12:19 AM     Clinical Impression: 1. Cough   2. URI (upper respiratory infection)     Disposition: Discharge  Condition: Good  I have discussed the results, Dx and Tx plan with the pt(& family if present). He/she/they expressed understanding and agree(s) with the plan. Discharge instructions discussed at great length. Strict return precautions discussed and pt &/or family have verbalized understanding of the instructions. No further questions at time of  discharge.    Discharge Medication List as of 01/27/2016 12:19 AM      Follow Up: [facility] [place] [phone] Schedule an appointment as soon as possible for a visit  If symptoms worsen, please return to the nearest ED.     Gwenyth Allegra Naja Apperson, MD 01/27/16 0100

## 2016-03-19 ENCOUNTER — Encounter: Payer: Self-pay | Admitting: Physical Therapy

## 2016-03-19 NOTE — Therapy (Signed)
[place], [place], [address] Phone: [phone]   Fax:  [phone]  Patient Details  Name: Anne Brewer MRN: [id number] Date of Birth: [birth year] Referring Provider:  No ref. provider found  Encounter Date: 03/19/2016  On 02/19/16, patient presented to our clinic with concerns of something that was documented in her medical record. She reported that she did not have anything wrong with her intelligence. Nuala Alpha listened to her concerns and let her know we would need to review the record and follow up afterwards. Patient requested we contact her at her [place] email address. On 10/25, email was sent requesting patient to contact us by phone. On 03/13/16, patient arrived at our clinic to follow up. Hinton Dyer ([employer]) explained that the documentation she had questioned in her record related to her awareness at the time of eval and nothing to do with her intelligence. Pt appeared satisfied with this clarification. Patient proceeded to share some personal items she is experiencing. Faythe Dingwall and Kemp listened and offered resources. Patient said she has already reached out to these resources.   [facility] 03/19/2016, 4:46 PM  [place], [place], [address] Phone: [phone]   Fax:  [phone]

## 2016-04-04 ENCOUNTER — Encounter: Payer: Self-pay | Admitting: Specialist

## 2016-05-09 ENCOUNTER — Encounter: Payer: Self-pay | Admitting: Physician Assistant

## 2016-05-17 ENCOUNTER — Ambulatory Visit: Payer: Medicare Other | Admitting: Physician Assistant

## 2016-05-31 ENCOUNTER — Inpatient Hospital Stay (HOSPITAL_COMMUNITY)
Admission: AD | Admit: 2016-05-31 | Discharge: 2016-05-31 | Disposition: A | Discharge status: Home / Self Care | Payer: Medicare Other | Source: Ambulatory Visit | Attending: Obstetrics and Gynecology | Admitting: Obstetrics and Gynecology

## 2016-05-31 ENCOUNTER — Encounter (HOSPITAL_COMMUNITY): Payer: Self-pay

## 2016-05-31 DIAGNOSIS — Z91013 Allergy to seafood: Secondary | ICD-10-CM | POA: Diagnosis not present

## 2016-05-31 DIAGNOSIS — N939 Abnormal uterine and vaginal bleeding, unspecified: Secondary | ICD-10-CM | POA: Diagnosis not present

## 2016-05-31 DIAGNOSIS — Z3202 Encounter for pregnancy test, result negative: Secondary | ICD-10-CM | POA: Diagnosis not present

## 2016-05-31 DIAGNOSIS — Z91011 Allergy to milk products: Secondary | ICD-10-CM | POA: Diagnosis not present

## 2016-05-31 DIAGNOSIS — F17210 Nicotine dependence, cigarettes, uncomplicated: Secondary | ICD-10-CM | POA: Diagnosis not present

## 2016-05-31 DIAGNOSIS — Z903 Acquired absence of stomach [part of]: Secondary | ICD-10-CM | POA: Diagnosis not present

## 2016-05-31 DIAGNOSIS — Z9049 Acquired absence of other specified parts of digestive tract: Secondary | ICD-10-CM | POA: Insufficient documentation

## 2016-05-31 DIAGNOSIS — N926 Irregular menstruation, unspecified: Secondary | ICD-10-CM

## 2016-05-31 DIAGNOSIS — Z96651 Presence of right artificial knee joint: Secondary | ICD-10-CM | POA: Diagnosis not present

## 2016-05-31 DIAGNOSIS — F430 Acute stress reaction: Secondary | ICD-10-CM | POA: Diagnosis not present

## 2016-05-31 DIAGNOSIS — D649 Anemia, unspecified: Secondary | ICD-10-CM | POA: Diagnosis not present

## 2016-05-31 DIAGNOSIS — F439 Reaction to severe stress, unspecified: Secondary | ICD-10-CM | POA: Diagnosis not present

## 2016-05-31 LAB — CBC
HCT: 28.3 % — ABNORMAL LOW (ref 36.0–46.0)
Hemoglobin: 9.2 g/dL — ABNORMAL LOW (ref 12.0–15.0)
MCH: 27.6 pg (ref 26.0–34.0)
MCHC: 32.5 g/dL (ref 30.0–36.0)
MCV: 85 fL (ref 78.0–100.0)
PLATELETS: 340 10*3/uL (ref 150–400)
RBC: 3.33 MIL/uL — ABNORMAL LOW (ref 3.87–5.11)
RDW: 18.1 % — ABNORMAL HIGH (ref 11.5–15.5)
WBC: 7.3 10*3/uL (ref 4.0–10.5)

## 2016-05-31 LAB — WET PREP, GENITAL
Clue Cells Wet Prep HPF POC: NONE SEEN
Sperm: NONE SEEN
Trich, Wet Prep: NONE SEEN
YEAST WET PREP: NONE SEEN

## 2016-05-31 LAB — URINALYSIS, ROUTINE W REFLEX MICROSCOPIC
BILIRUBIN URINE: NEGATIVE
Bacteria, UA: NONE SEEN
GLUCOSE, UA: NEGATIVE mg/dL
KETONES UR: NEGATIVE mg/dL
LEUKOCYTES UA: NEGATIVE
NITRITE: NEGATIVE
PH: 6 (ref 5.0–8.0)
Protein, ur: NEGATIVE mg/dL
Specific Gravity, Urine: 1.01 (ref 1.005–1.030)

## 2016-05-31 LAB — POCT PREGNANCY, URINE: PREG TEST UR: NEGATIVE
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
## 2016-05-31 NOTE — Discharge Instructions (Signed)
Anemia, Nonspecific Anemia is a condition in which the concentration of red blood cells or hemoglobin in the blood is below normal. Hemoglobin is a substance in red blood cells that carries oxygen to the tissues of the body. Anemia results in not enough oxygen reaching these tissues. What are the causes? Common causes of anemia include:  Excessive bleeding. Bleeding may be internal or external. This includes excessive bleeding from periods (in women) or from the intestine.  Poor nutrition.  Chronic kidney, thyroid, and liver disease.  Bone marrow disorders that decrease red blood cell production.  Cancer and treatments for cancer.  HIV, AIDS, and their treatments.  Spleen problems that increase red blood cell destruction.  Blood disorders.  Excess destruction of red blood cells due to infection, medicines, and autoimmune disorders. What are the signs or symptoms?  Minor weakness.  Dizziness.  Headache.  Palpitations.  Shortness of breath, especially with exercise.  Paleness.  Cold sensitivity.  Indigestion.  Nausea.  Difficulty sleeping.  Difficulty concentrating. Symptoms may occur suddenly or they may develop slowly. How is this diagnosed? Additional blood tests are often needed. These help your health care provider determine the best treatment. Your health care provider will check your stool for blood and look for other causes of blood loss. How is this treated? Treatment varies depending on the cause of the anemia. Treatment can include:  Supplements of iron, vitamin 123456, or folic acid.  Hormone medicines.  A blood transfusion. This may be needed if blood loss is severe.  Hospitalization. This may be needed if there is significant continual blood loss.  Dietary changes.  Spleen removal. Follow these instructions at home: Keep all follow-up appointments. It often takes many weeks to correct anemia, and having your health care provider check on your  condition and your response to treatment is very important. Get help right away if:  You develop extreme weakness, shortness of breath, or chest pain.  You become dizzy or have trouble concentrating.  You develop heavy vaginal bleeding.  You develop a rash.  You have bloody or black, tarry stools.  You faint.  You vomit up blood.  You vomit repeatedly.  You have abdominal pain.  You have a fever or persistent symptoms for more than 2-3 days.  You have a fever and your symptoms suddenly get worse.  You are dehydrated. This information is not intended to replace advice given to you by your health care provider. Make sure you discuss any questions you have with your health care provider. Document Released: 05/23/2004 Document Revised: 09/27/2015 Document Reviewed: 10/09/2012 Elsevier Interactive Patient Education  2017 Elsevier Inc. Menorrhagia Menorrhagia is when your menstrual periods are heavy or last longer than usual. Follow these instructions at home:  Only take medicine as told by your doctor.  Take any iron pills as told by your doctor. Heavy bleeding may cause low levels of iron in your body.  Do not take aspirin 1 week before or during your period. Aspirin can make the bleeding worse.  Lie down for a while if you change your tampon or pad more than once in 2 hours. This may help lessen the bleeding.  Eat a healthy diet and foods with iron. These foods include leafy green vegetables, meat, liver, eggs, and whole grain breads and cereals.  Do not try to lose weight. Wait until the heavy bleeding has stopped and your iron level is normal. Contact a doctor if:  You soak through a pad or tampon every 1  or 2 hours, and this happens every time you have a period.  You need to use pads and tampons at the same time because you are bleeding so much.  You need to change your pad or tampon during the night.  You have a period that lasts for more than 8 days.  You pass  clots bigger than 1 inch (2.5 cm) wide.  You have irregular periods that happen more or less often than once a month.  You feel dizzy or pass out (faint).  You feel very weak or tired.  You feel short of breath or feel your heart is beating too fast when you exercise.  You feel sick to your stomach (nausea) and you throw up (vomit) while you are taking your medicine.  You have watery poop (diarrhea) while you are taking your medicine.  You have any problems that may be related to the medicine you are taking. Get help right away if:  You soak through 4 or more pads or tampons in 2 hours.  You have any bleeding while you are pregnant. This information is not intended to replace advice given to you by your health care provider. Make sure you discuss any questions you have with your health care provider. Document Released: 01/23/2008 Document Revised: 09/21/2015 Document Reviewed: 10/15/2012 Elsevier Interactive Patient Education  2017 [employer].
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
## 2016-06-01 LAB — HIV ANTIBODY (ROUTINE TESTING W REFLEX): HIV Screen 4th Generation wRfx: NONREACTIVE

## 2016-06-01 LAB — RPR: RPR Ser Ql: NONREACTIVE

## 2016-06-03 LAB — GC/CHLAMYDIA PROBE AMP (~~LOC~~) NOT AT ARMC
CHLAMYDIA, DNA PROBE: NEGATIVE
Neisseria Gonorrhea: NEGATIVE

## 2016-06-10 ENCOUNTER — Ambulatory Visit: Payer: Medicare Other | Admitting: Obstetrics and Gynecology

## 2016-07-04 ENCOUNTER — Encounter: Payer: Self-pay | Admitting: Obstetrics & Gynecology

## 2016-07-04 ENCOUNTER — Other Ambulatory Visit (HOSPITAL_COMMUNITY)
Admission: RE | Admit: 2016-07-04 | Discharge: 2016-07-04 | Disposition: A | Discharge status: Home / Self Care | Payer: Medicare Other | Source: Ambulatory Visit | Attending: Obstetrics & Gynecology | Admitting: Obstetrics & Gynecology

## 2016-07-04 ENCOUNTER — Ambulatory Visit (INDEPENDENT_AMBULATORY_CARE_PROVIDER_SITE_OTHER): Payer: Medicare Other | Admitting: Obstetrics & Gynecology

## 2016-07-04 VITALS — BP 128/87 | HR 81 | Wt 210.0 lb

## 2016-07-04 DIAGNOSIS — Z01419 Encounter for gynecological examination (general) (routine) without abnormal findings: Secondary | ICD-10-CM | POA: Insufficient documentation

## 2016-07-04 DIAGNOSIS — Z1151 Encounter for screening for human papillomavirus (HPV): Secondary | ICD-10-CM | POA: Diagnosis present

## 2016-07-04 DIAGNOSIS — Z659 Problem related to unspecified psychosocial circumstances: Secondary | ICD-10-CM

## 2016-07-04 DIAGNOSIS — N939 Abnormal uterine and vaginal bleeding, unspecified: Secondary | ICD-10-CM

## 2016-07-04 NOTE — Patient Instructions (Addendum)
According to tests on 05/31/2016, you do not have any STDs that were tested (no HIV, syphilis, gonorrhea, chlamydia or trichomonas)   My Chart Enrollment Instructions Thank you for enrolling in [place]. Please follow the instructions below to securely access your online medical record. MyChart allows you to send messages to your doctor, view your test results, manage appointments, and more.   How Do I Sign Up? 1. In your Internet browser, go to [employer] and enter [email or web address]. 2. Click on the Sign Up Now link in the Sign In box. You will see the New Member Sign Up page. 3. Enter your MyChart Access Code exactly as it appears below. You will not need to use this code after you've completed the sign-up process. If you do not sign up before the expiration date, you must request a new code.  MyChart Access Code: [email or web address] Expires: 07/30/2016 12:00 PM  4. Enter your Social Security Number ([email or web address]) and Date of Birth (mm/dd/yyyy) as indicated and click Submit. You will be taken to the next sign-up page. 5. Create a MyChart ID. This will be your MyChart login ID and cannot be changed, so think of one that is secure and easy to remember. 6. Create a MyChart password. You can change your password at any time. 7. Enter your Password Reset Question and Answer. This can be used at a later time if you forget your password.  8. Enter your e-mail address. You will receive e-mail notification when new information is available in [place]. 9. Click Sign Up. You can now view your medical record.   Additional Information Remember, MyChart is NOT to be used for urgent needs. For medical emergencies, dial 911.     Dysfunctional Uterine Bleeding Dysfunctional uterine bleeding is abnormal bleeding from the uterus. Dysfunctional uterine bleeding includes:  A period that comes earlier or later than usual.  A period that is lighter, heavier, or has blood clots.  Bleeding  between periods.  Skipping one or more periods.  Bleeding after sexual intercourse.  Bleeding after menopause. Follow these instructions at home: Pay attention to any changes in your symptoms. Follow these instructions to help with your condition: Eating and drinking   Eat well-balanced meals. Include foods that are high in iron, such as liver, meat, shellfish, green leafy vegetables, and eggs.  If you become constipated:  Drink plenty of water.  Eat fruits and vegetables that are high in water and fiber, such as spinach, carrots, raspberries, apples, and mango. Medicines   Take over-the-counter and prescription medicines only as told by your health care provider.  Do not change medicines without talking with your health care provider.  Aspirin or medicines that contain aspirin may make the bleeding worse. Do not take those medicines:  During the week before your period.  During your period.  If you were prescribed iron pills, take them as told by your health care provider. Iron pills help to replace iron that your body loses because of this condition. Activity   If you need to change your sanitary pad or tampon more than one time every 2 hours:  Lie in bed with your feet raised (elevated).  Place a cold pack on your lower abdomen.  Rest as much as possible until the bleeding stops or slows down.  Do not try to lose weight until the bleeding has stopped and your blood iron level is back to normal. Other Instructions   For two months, write down:  When your period starts.  When your period ends.  When any abnormal bleeding occurs.  What problems you notice.  Keep all follow up visits as told by your health care provider. This is important. Contact a health care provider if:  You get light-headed or weak.  You have nausea and vomiting.  You cannot eat or drink without vomiting.  You feel dizzy or have diarrhea while you are taking medicines.  You are  taking birth control pills or hormones, and you want to change them or stop taking them. Get help right away if:  You develop a fever or chills.  You need to change your sanitary pad or tampon more than one time per hour.  Your bleeding becomes heavier, or your flow contains clots more often.  You develop pain in your abdomen.  You lose consciousness.  You develop a rash. This information is not intended to replace advice given to you by your health care provider. Make sure you discuss any questions you have with your health care provider. Document Released: 04/12/2000 Document Revised: 09/21/2015 Document Reviewed: 07/11/2014 Elsevier Interactive Patient Education  2017 [employer].
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
## 2016-07-05 ENCOUNTER — Other Ambulatory Visit: Payer: Self-pay | Admitting: Specialist

## 2016-07-05 DIAGNOSIS — N644 Mastodynia: Secondary | ICD-10-CM

## 2016-07-08 LAB — CYTOLOGY - PAP
DIAGNOSIS: NEGATIVE
HPV: NOT DETECTED

## 2016-07-10 ENCOUNTER — Ambulatory Visit (INDEPENDENT_AMBULATORY_CARE_PROVIDER_SITE_OTHER): Payer: Self-pay | Admitting: Clinical

## 2016-07-10 ENCOUNTER — Ambulatory Visit (HOSPITAL_COMMUNITY)
Admission: RE | Admit: 2016-07-10 | Discharge: 2016-07-10 | Disposition: A | Discharge status: Home / Self Care | Payer: Medicare Other | Source: Ambulatory Visit | Attending: Obstetrics & Gynecology | Admitting: Obstetrics & Gynecology

## 2016-07-10 DIAGNOSIS — N840 Polyp of corpus uteri: Secondary | ICD-10-CM | POA: Insufficient documentation

## 2016-07-10 DIAGNOSIS — D250 Submucous leiomyoma of uterus: Secondary | ICD-10-CM | POA: Diagnosis not present

## 2016-07-10 DIAGNOSIS — N939 Abnormal uterine and vaginal bleeding, unspecified: Secondary | ICD-10-CM | POA: Insufficient documentation

## 2016-07-10 DIAGNOSIS — Z658 Other specified problems related to psychosocial circumstances: Secondary | ICD-10-CM
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
## 2016-07-16 ENCOUNTER — Encounter: Payer: Self-pay | Admitting: *Deleted

## 2016-07-18 ENCOUNTER — Other Ambulatory Visit: Payer: Self-pay

## 2016-07-22 ENCOUNTER — Other Ambulatory Visit: Payer: Self-pay

## 2016-07-25 ENCOUNTER — Other Ambulatory Visit: Payer: Self-pay

## 2016-07-25 ENCOUNTER — Ambulatory Visit
Admission: RE | Admit: 2016-07-25 | Discharge: 2016-07-25 | Disposition: A | Discharge status: Home / Self Care | Payer: Medicare Other | Source: Ambulatory Visit | Attending: Specialist | Admitting: Specialist

## 2016-07-25 DIAGNOSIS — N644 Mastodynia: Secondary | ICD-10-CM

## 2016-08-26 ENCOUNTER — Encounter: Payer: Self-pay | Admitting: Obstetrics and Gynecology

## 2016-08-26 ENCOUNTER — Other Ambulatory Visit (HOSPITAL_COMMUNITY)
Admission: RE | Admit: 2016-08-26 | Discharge: 2016-08-26 | Disposition: A | Discharge status: Home / Self Care | Payer: Medicare Other | Source: Ambulatory Visit | Attending: Obstetrics and Gynecology | Admitting: Obstetrics and Gynecology

## 2016-08-26 ENCOUNTER — Ambulatory Visit (INDEPENDENT_AMBULATORY_CARE_PROVIDER_SITE_OTHER): Payer: Medicare Other | Admitting: Obstetrics and Gynecology

## 2016-08-26 VITALS — BP 127/82 | HR 58 | Wt 215.0 lb

## 2016-08-26 DIAGNOSIS — N938 Other specified abnormal uterine and vaginal bleeding: Secondary | ICD-10-CM

## 2016-08-26 NOTE — Progress Notes (Signed)
Patient is in the office lab results.
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
## 2016-09-11 ENCOUNTER — Ambulatory Visit (INDEPENDENT_AMBULATORY_CARE_PROVIDER_SITE_OTHER): Payer: Medicare Other | Admitting: Obstetrics and Gynecology

## 2016-09-11 ENCOUNTER — Ambulatory Visit (INDEPENDENT_AMBULATORY_CARE_PROVIDER_SITE_OTHER): Payer: Medicare Other | Admitting: Clinical

## 2016-09-11 DIAGNOSIS — D251 Intramural leiomyoma of uterus: Secondary | ICD-10-CM

## 2016-09-11 DIAGNOSIS — D259 Leiomyoma of uterus, unspecified: Secondary | ICD-10-CM | POA: Insufficient documentation

## 2016-09-11 DIAGNOSIS — D250 Submucous leiomyoma of uterus: Secondary | ICD-10-CM | POA: Diagnosis not present

## 2016-09-11 DIAGNOSIS — N939 Abnormal uterine and vaginal bleeding, unspecified: Secondary | ICD-10-CM | POA: Insufficient documentation

## 2016-09-11 DIAGNOSIS — Z658 Other specified problems related to psychosocial circumstances: Secondary | ICD-10-CM
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
## 2016-09-11 NOTE — Progress Notes (Signed)
Anne Brewer presents today for F/U of her EMBX secondary to an episode of AUB this past Feb. EMBX results were negative and this was conveyed to Anne Brewer. She reports cycles since Feb have been normal. She still reports being under a lot of stress. She is seeing [facility] for assistance in this matter.  A/P AUB, resolved Uterine fibroids  Pt was instructed to keep a menstrual calendar and if she notes amy significant changes in her cycle to contact ue. She otherwise will f/u in Feb for her yearly exam. She was encouraged to continue her care with [facility] as well.

## 2016-10-14 ENCOUNTER — Ambulatory Visit (HOSPITAL_COMMUNITY)
Admission: EM | Admit: 2016-10-14 | Discharge: 2016-10-14 | Disposition: A | Discharge status: Home / Self Care | Payer: Medicare Other | Attending: Family Medicine | Admitting: Family Medicine

## 2016-10-14 ENCOUNTER — Encounter (HOSPITAL_COMMUNITY): Payer: Self-pay | Admitting: *Deleted

## 2016-10-14 DIAGNOSIS — J301 Allergic rhinitis due to pollen: Secondary | ICD-10-CM

## 2016-10-14 DIAGNOSIS — R059 Cough, unspecified: Secondary | ICD-10-CM

## 2016-10-14 DIAGNOSIS — R05 Cough: Secondary | ICD-10-CM

## 2016-10-14 DIAGNOSIS — H10501 Unspecified blepharoconjunctivitis, right eye: Secondary | ICD-10-CM

## 2016-10-14 MED ORDER — TOBRAMYCIN 0.3 % OP SOLN: 1.0000 [drp] | OPHTHALMIC | 0 refills | Status: DC

## 2016-10-14 MED ORDER — CETIRIZINE HCL 10 MG PO TABS: 10.0000 mg | ORAL_TABLET | Freq: Every day | ORAL | 1 refills | Status: DC

## 2016-10-14 NOTE — ED Provider Notes (Addendum)
[place]    CSN: [id number] Arrival date & time: 10/14/16  1314     History   Chief Complaint Chief Complaint  Patient presents with  . Eye Problem    HPI Anne Brewer is a 46 y.o. female.   Pt  Reports   Irritation  Of  right  Eye.   Woke  Up  with green discharge  This   Am    From  Eye  .     Pt  Reports She  Thinks  An  Insect  Bit   Her About  2  Months  Ago.  Patient also complains about a productive cough that she's had for a month. She states that the air conditioning seems to bother her. She also smokes. She's had no hemoptysis or fever.      Past Medical History:  Diagnosis Date  . Anemia   . Arthritis     Patient Active Problem List   Diagnosis Date Noted  . Abnormal uterine bleeding (AUB) 09/11/2016  . Uterine fibroid 09/11/2016  . 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 ABDOMINAL SURGERY    . CESAREAN SECTION    . CHOLECYSTECTOMY    . LAPAROSCOPIC GASTRIC SLEEVE RESECTION    . TOTAL KNEE ARTHROPLASTY Right 07/12/2015   Procedure: TOTAL KNEE ARTHROPLASTY;  Surgeon: [name], MD;  Location: [place];  Service: Orthopedics;  Laterality: Right;    OB History    Gravida Para Term Preterm AB Living   3       1 4    SAB TAB Ectopic Multiple Live Births   1     1         Home Medications    Prior to Admission medications   Medication Sig Start Date End Date Taking? Authorizing Provider  Calcium Carb-Cholecalciferol (CALCIUM + D3 PO) Take 1 tablet by mouth 2 (two) times daily.    [provider]  cetirizine (ZYRTEC ALLERGY) 10 MG tablet Take 1 tablet (10 mg total) by mouth daily. 10/14/16   Robyn Haber, MD  ibuprofen (ADVIL,MOTRIN) 800 MG tablet Take 800 mg by mouth daily as needed. 05/10/16   [provider]  methocarbamol (ROBAXIN) 500 MG tablet Take 1 tablet (500 mg total) by mouth 4 (four) times daily. 07/12/15   Aundra Dubin, PA-C  Multiple  Vitamins-Minerals (MULTI COMPLETE PO) Take by mouth daily.     [provider]  OVER THE COUNTER MEDICATION Take 1 capsule by mouth daily. B12 Stacker    [provider]  tobramycin (TOBREX) 0.3 % ophthalmic solution Place 1 drop into the right eye every 4 (four) hours. 10/14/16   Robyn Haber, MD    Family History History reviewed. No pertinent family history.  Social History Social History  Substance Use Topics  . Smoking status: Current Every Day Smoker    Packs/day: 0.25    Years: 2.00    Types: Cigarettes  . Smokeless tobacco: Never Used  . Alcohol use Yes     Comment: occasionally     Allergies   Shellfish allergy and Lactose intolerance (gi)   Review of Systems Review of Systems  Eyes: Positive for discharge.  Respiratory: Positive for cough.   All other systems reviewed and are negative.    Physical Exam Triage Vital Signs ED Triage Vitals [10/14/16 1331]  Enc Vitals Group     BP 110/70  Pulse Rate 82     Resp 18     Temp 98.6 F (37 C)     Temp Source Oral     SpO2 100 %     Weight      Height      Head Circumference      Peak Flow      Pain Score      Pain Loc      Pain Edu?      Excl. in [place]?    No data found.   Updated Vital Signs BP 110/70 (BP Location: Right Arm)   Pulse 82   Temp 98.6 F (37 C) (Oral)   Resp 18   LMP 10/06/2016   SpO2 100%    Physical Exam  Constitutional: She is oriented to person, place, and time. She appears well-developed and well-nourished.  HENT:  Right Ear: External ear normal.  Left Ear: External ear normal.  Mouth/Throat: Oropharynx is clear and moist.  Eyes: EOM are normal. Pupils are equal, round, and reactive to light. Right eye exhibits discharge.  Neck: Normal range of motion. Neck supple.  Cardiovascular: Normal rate, regular rhythm and normal heart sounds.   Pulmonary/Chest: Effort normal and breath sounds normal.  Musculoskeletal: Normal range of motion.  Neurological:  She is alert and oriented to person, place, and time.  Skin: Skin is warm and dry.  Nursing note and vitals reviewed.    UC Treatments / Results  Labs (all labs ordered are listed, but only abnormal results are displayed) Labs Reviewed - No data to display  EKG  EKG Interpretation None       Radiology No results found.  Procedures Procedures (including critical care time)  Medications Ordered in UC Medications - No data to display   Initial Impression / Assessment and Plan / UC Course  I have reviewed the triage vital signs and the nursing notes.  Pertinent labs & imaging results that were available during my care of the patient were reviewed by me and considered in my medical decision making (see chart for details).     Final Clinical Impressions(s) / UC Diagnoses   Final diagnoses:  Blepharoconjunctivitis of right eye, unspecified blepharoconjunctivitis type  Seasonal allergic rhinitis due to pollen  Cough    New Prescriptions New Prescriptions   CETIRIZINE (ZYRTEC ALLERGY) 10 MG TABLET    Take 1 tablet (10 mg total) by mouth daily.   TOBRAMYCIN (TOBREX) 0.3 % OPHTHALMIC SOLUTION    Place 1 drop into the right eye every 4 (four) hours.     Robyn Haber, MD 10/14/16 1357    Robyn Haber, MD 10/14/16 1419

## 2016-10-14 NOTE — Discharge Instructions (Signed)
For plastic surgery, call [phone] and ask for the plastic surgery department.  Tell them that you would like to have skin fold surgery following major weight loss and would like the teaching service to accept you as a patient.

## 2016-10-14 NOTE — ED Triage Notes (Signed)
Pt  Reports   Irritation  Of  r     Eye         Woke  Up     Crust   This   Am    From  Eye  Drainage       No   specefic     Pt  Reports   She  Thinks  An  Insect  Bit     Her   About     2  Months      Ago

## 2016-12-10 ENCOUNTER — Ambulatory Visit (HOSPITAL_COMMUNITY)
Admission: EM | Admit: 2016-12-10 | Discharge: 2016-12-10 | Disposition: A | Discharge status: Home / Self Care | Payer: Medicare Other | Attending: Emergency Medicine | Admitting: Emergency Medicine

## 2016-12-10 ENCOUNTER — Encounter (HOSPITAL_COMMUNITY): Payer: Self-pay | Admitting: Emergency Medicine

## 2016-12-10 DIAGNOSIS — K0889 Other specified disorders of teeth and supporting structures: Secondary | ICD-10-CM | POA: Diagnosis not present

## 2016-12-10 DIAGNOSIS — H9202 Otalgia, left ear: Secondary | ICD-10-CM | POA: Diagnosis not present

## 2016-12-10 MED ORDER — TRAMADOL HCL 50 MG PO TABS: 50.0000 mg | ORAL_TABLET | Freq: Four times a day (QID) | ORAL | 0 refills | Status: DC | PRN

## 2016-12-10 NOTE — Discharge Instructions (Signed)
It is likely that the dental pain is radiating toward the year causing most of the pain in the ear. Take ibuprofen 800 mg every 8 hours as needed. You may also take the tramadol 1 every 4-6 hours as needed for pain. Make sure you see your dentist tomorrow. If after seeing the dentist and repairing your to you continue to have earache you may want to have it reevaluated.
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
## 2016-12-10 NOTE — ED Triage Notes (Signed)
The patient presented to the [facility] with a complaint of left ear pain x 1 week.

## 2017-01-07 ENCOUNTER — Encounter (HOSPITAL_COMMUNITY): Payer: Self-pay

## 2017-01-07 ENCOUNTER — Emergency Department (HOSPITAL_COMMUNITY)
Admission: EM | Admit: 2017-01-07 | Discharge: 2017-01-07 | Disposition: A | Discharge status: Home / Self Care | Payer: Medicare Other | Attending: Emergency Medicine | Admitting: Emergency Medicine

## 2017-01-07 DIAGNOSIS — Z79899 Other long term (current) drug therapy: Secondary | ICD-10-CM | POA: Insufficient documentation

## 2017-01-07 DIAGNOSIS — Z96651 Presence of right artificial knee joint: Secondary | ICD-10-CM | POA: Insufficient documentation

## 2017-01-07 DIAGNOSIS — R6884 Jaw pain: Secondary | ICD-10-CM | POA: Diagnosis present

## 2017-01-07 DIAGNOSIS — F17210 Nicotine dependence, cigarettes, uncomplicated: Secondary | ICD-10-CM | POA: Insufficient documentation

## 2017-01-07 DIAGNOSIS — K0889 Other specified disorders of teeth and supporting structures: Secondary | ICD-10-CM | POA: Insufficient documentation

## 2017-01-07 LAB — CBC
HCT: 30 % — ABNORMAL LOW (ref 36.0–46.0)
Hemoglobin: 9.6 g/dL — ABNORMAL LOW (ref 12.0–15.0)
MCH: 28.6 pg (ref 26.0–34.0)
MCHC: 32 g/dL (ref 30.0–36.0)
MCV: 89.3 fL (ref 78.0–100.0)
PLATELETS: 390 10*3/uL (ref 150–400)
RBC: 3.36 MIL/uL — AB (ref 3.87–5.11)
RDW: 14.8 % (ref 11.5–15.5)
WBC: 9.4 10*3/uL (ref 4.0–10.5)

## 2017-01-07 LAB — COMPREHENSIVE METABOLIC PANEL
ALBUMIN: 3.3 g/dL — AB (ref 3.5–5.0)
ALT: 12 U/L — AB (ref 14–54)
AST: 21 U/L (ref 15–41)
Alkaline Phosphatase: 69 U/L (ref 38–126)
Anion gap: 9 (ref 5–15)
BUN: 10 mg/dL (ref 6–20)
CO2: 23 mmol/L (ref 22–32)
CREATININE: 1.18 mg/dL — AB (ref 0.44–1.00)
Calcium: 9 mg/dL (ref 8.9–10.3)
Chloride: 103 mmol/L (ref 101–111)
GFR calc non Af Amer: 54 mL/min — ABNORMAL LOW (ref >60)
GLUCOSE: 94 mg/dL (ref 65–99)
Potassium: 3.7 mmol/L (ref 3.5–5.1)
SODIUM: 135 mmol/L (ref 135–145)
Total Bilirubin: 0.3 mg/dL (ref 0.3–1.2)
Total Protein: 7.7 g/dL (ref 6.5–8.1)

## 2017-01-07 LAB — URINALYSIS, ROUTINE W REFLEX MICROSCOPIC
Bilirubin Urine: NEGATIVE
GLUCOSE, UA: NEGATIVE mg/dL
HGB URINE DIPSTICK: NEGATIVE
Ketones, ur: NEGATIVE mg/dL
Leukocytes, UA: NEGATIVE
Nitrite: NEGATIVE
PROTEIN: NEGATIVE mg/dL
SPECIFIC GRAVITY, URINE: 1.024 (ref 1.005–1.030)
pH: 5 (ref 5.0–8.0)

## 2017-01-07 LAB — LIPASE, BLOOD: LIPASE: 40 U/L (ref 11–51)

## 2017-01-07 LAB — I-STAT BETA HCG BLOOD, ED (MC, WL, AP ONLY): I-stat hCG, quantitative: <5 m[IU]/mL (ref <5)

## 2017-01-07 NOTE — Discharge Instructions (Signed)
Please read attached information. If you experience any new or worsening signs or symptoms please return to the emergency room for evaluation. Please follow-up with your primary care provider or specialist as discussed.  °

## 2017-01-07 NOTE — ED Provider Notes (Signed)
[name] DEPT Provider Note   CSN: [id number] Arrival date & time: 01/07/17  1120     History   Chief Complaint Chief Complaint  Patient presents with  . Abdominal Pain  . Dental Pain    HPI Anne Brewer is a 46 y.o. female.  HPI   46 year old female presents today with request for evaluation.  Patient notes a history of gastric bypass losing over 200 pounds.  She notes that she has been using ibuprofen and wanted to make sure she did not have an ulcer.  She denies any abdominal pain, nausea, vomiting, indigestion.  Patient notes that she did have several episodes of blood in her urine over the last several months, none presently and believe this was caused by an ulcer.  She reports no pain with eating or drinking, denies any change in bowel movements.  Patient reports she recently had dental extraction, reports some pain to the upper jaw, no signs of infectious etiology.  She reports she is taking antibiotics and pain medication as directed.       Past Medical History:  Diagnosis Date  . Anemia   . Arthritis     Patient Active Problem List   Diagnosis Date Noted  . Abnormal uterine bleeding (AUB) 09/11/2016  . Uterine fibroid 09/11/2016  . 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 ABDOMINAL SURGERY    . CESAREAN SECTION    . CHOLECYSTECTOMY    . LAPAROSCOPIC GASTRIC SLEEVE RESECTION    . TOTAL KNEE ARTHROPLASTY Right 07/12/2015   Procedure: TOTAL KNEE ARTHROPLASTY;  Surgeon: [name], MD;  Location: [place];  Service: Orthopedics;  Laterality: Right;    OB History    Gravida Para Term Preterm AB Living   3       1 4    SAB TAB Ectopic Multiple Live Births   1     1         Home Medications    Prior to Admission medications   Medication Sig Start Date End Date Taking? Authorizing Provider  Calcium Carb-Cholecalciferol (CALCIUM + D3 PO) Take 1 tablet by mouth 2 (two) times daily.     [provider]  cetirizine (ZYRTEC ALLERGY) 10 MG tablet Take 1 tablet (10 mg total) by mouth daily. 10/14/16   Robyn Haber, MD  ibuprofen (ADVIL,MOTRIN) 800 MG tablet Take 800 mg by mouth daily as needed. 05/10/16   [provider]  methocarbamol (ROBAXIN) 500 MG tablet Take 1 tablet (500 mg total) by mouth 4 (four) times daily. 07/12/15   Aundra Dubin, PA-C  Multiple Vitamins-Minerals (MULTI COMPLETE PO) Take by mouth daily.     [provider]  OVER THE COUNTER MEDICATION Take 1 capsule by mouth daily. B12 Stacker    [provider]  tobramycin (TOBREX) 0.3 % ophthalmic solution Place 1 drop into the right eye every 4 (four) hours. 10/14/16   Robyn Haber, MD  traMADol (ULTRAM) 50 MG tablet Take 1 tablet (50 mg total) by mouth every 6 (six) hours as needed. 12/10/16   Janne Napoleon, NP    Family History No family history on file.  Social History Social History  Substance Use Topics  . Smoking status: Current Every Day Smoker    Packs/day: 0.25    Years: 2.00    Types: Cigarettes  . Smokeless tobacco: Never Used  . Alcohol use Yes     Comment: occasionally  Allergies   Shellfish allergy and Lactose intolerance (gi)   Review of Systems Review of Systems  All other systems reviewed and are negative.    Physical Exam Updated Vital Signs BP (!) 129/94 (BP Location: Left Arm)   Pulse 88   Temp (!) 97.5 F (36.4 C) (Oral)   Resp 18   SpO2 98%   Physical Exam  Constitutional: She is oriented to person, place, and time. She appears well-developed and well-nourished.  HENT:  Head: Normocephalic and atraumatic.  Left upper third molar surgically removed, no signs of infectious etiology full active range of motion of the jaw  Eyes: Pupils are equal, round, and reactive to light. Conjunctivae are normal. Right eye exhibits no discharge. Left eye exhibits no discharge. No scleral icterus.  Neck: Normal range of motion. No JVD  present. No tracheal deviation present.  Pulmonary/Chest: Effort normal. No stridor.  Abdominal: Soft. She exhibits no distension and no mass. There is no tenderness. There is no rebound and no guarding. No hernia.  Neurological: She is alert and oriented to person, place, and time. Coordination normal.  Psychiatric: She has a normal mood and affect. Her behavior is normal. Judgment and thought content normal.  Nursing note and vitals reviewed.    ED Treatments / Results  Labs (all labs ordered are listed, but only abnormal results are displayed) Labs Reviewed  COMPREHENSIVE METABOLIC PANEL - Abnormal; Notable for the following:       Result Value   Creatinine, Ser 1.18 (*)    Albumin 3.3 (*)    ALT 12 (*)    GFR calc non Af Amer 54 (*)    All other components within normal limits  CBC - Abnormal; Notable for the following:    RBC 3.36 (*)    Hemoglobin 9.6 (*)    HCT 30.0 (*)    All other components within normal limits  URINALYSIS, ROUTINE W REFLEX MICROSCOPIC - Abnormal; Notable for the following:    APPearance HAZY (*)    All other components within normal limits  LIPASE, BLOOD  I-STAT BETA HCG BLOOD, ED ([facility], [facility], [facility] ONLY)    EKG  EKG Interpretation None       Radiology No results found.  Procedures Procedures (including critical care time)  Medications Ordered in ED Medications - No data to display   Initial Impression / Assessment and Plan / ED Course  I have reviewed the triage vital signs and the nursing notes.  Pertinent labs & imaging results that were available during my care of the patient were reviewed by me and considered in my medical decision making (see chart for details).      Final Clinical Impressions(s) / ED Diagnoses   Final diagnoses:  Pain, dental    46 year old female presents today with dental pain.  No signs of infectious etiology well-appearing in no acute distress, patient does have pain medication at home as needed for this.   Patient requesting evaluation for ulcer, she does not have abdominal pain, I am unable to determine exactly why she believes she has an ulcer other than she had some blood in her urine.  She has no blood in her urine presently, no abdominal complaints or normal findings on her laboratory analysis.  Patient will be referred to her primary care for evaluation and management, return precautions given.  New Prescriptions Discharge Medication List as of 01/07/2017  1:53 PM       Okey Regal, PA-C 01/07/17 2030  Daleen Bo, MD 01/07/17 2226
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
## 2018-06-03 IMAGING — US US TRANSVAGINAL NON-OB
1 series · 15 of 25 positions shown · non-contrast
Comparison: None

CLINICAL DATA: Abnormal uterine bleeding.

EXAM:
TRANSABDOMINAL AND TRANSVAGINAL ULTRASOUND OF PELVIS
TECHNIQUE: Both transabdominal and transvaginal ultrasound examinations of the
pelvis were performed. Transabdominal technique was performed for
global imaging of the pelvis including uterus, ovaries, adnexal
regions, and pelvic cul-de-sac. It was necessary to proceed with
endovaginal exam following the transabdominal exam to visualize the
endometrium and ovaries.

[Series 1: us transvaginal non-ob · 15 of 98 slices shown]
[im 1/98]
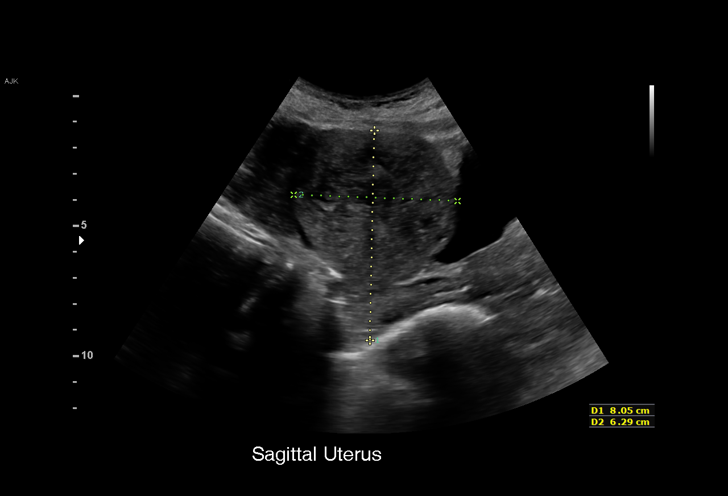
[im 9/98]
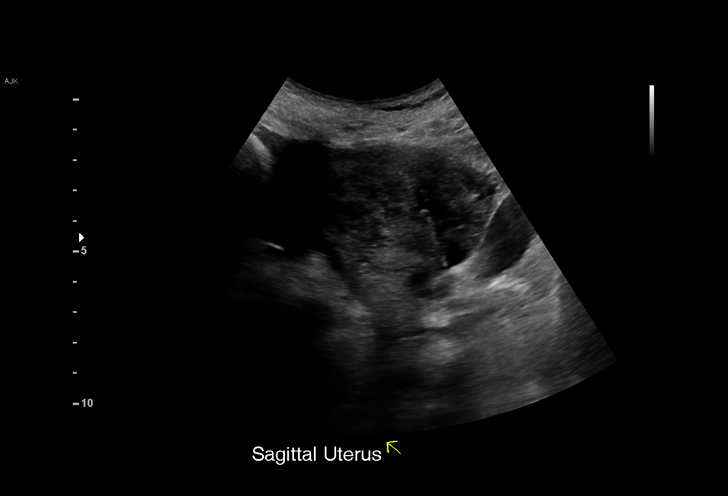
[im 17/98]
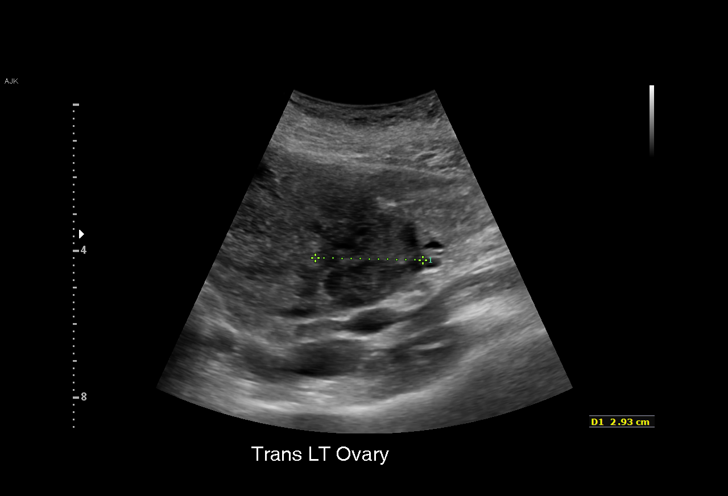
[im 21/98]
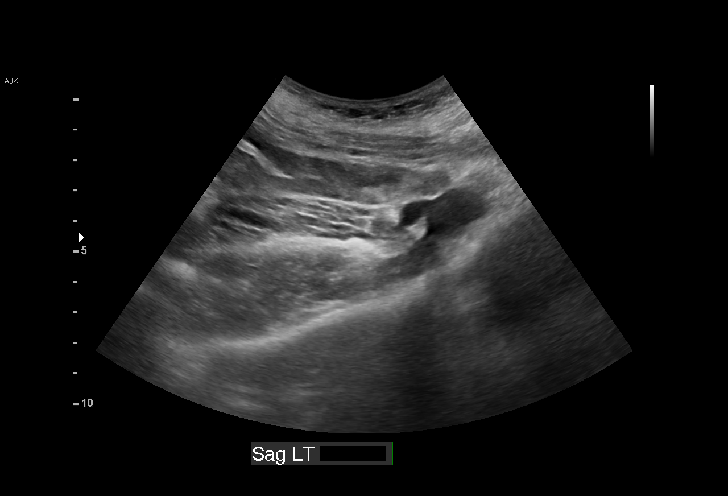
[im 29/98]
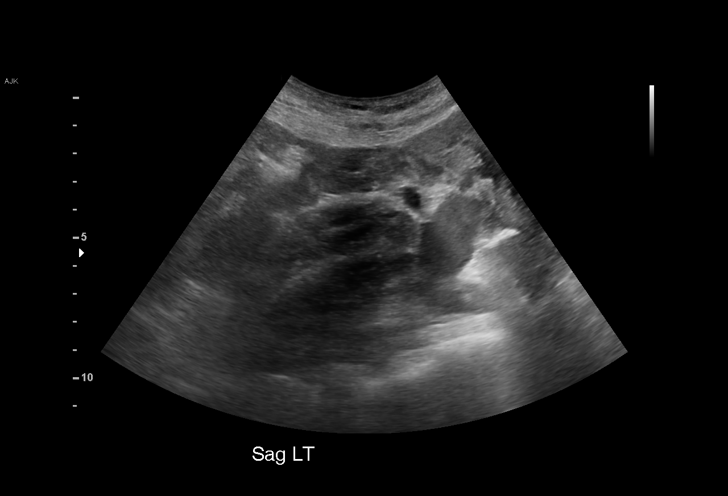
[im 37/98]
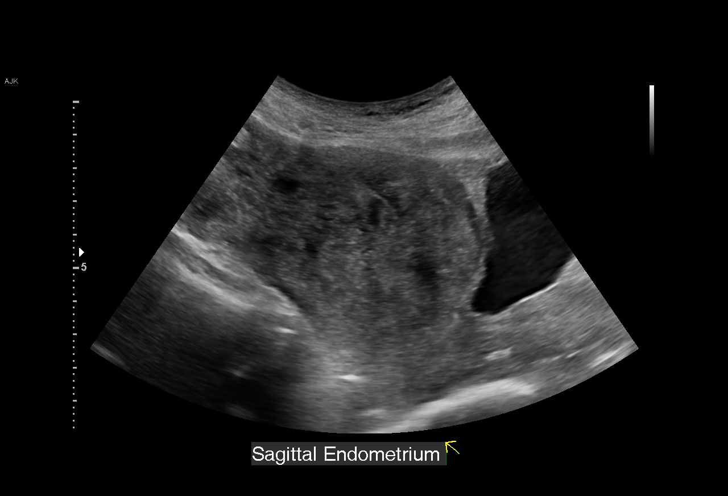
[im 41/98]
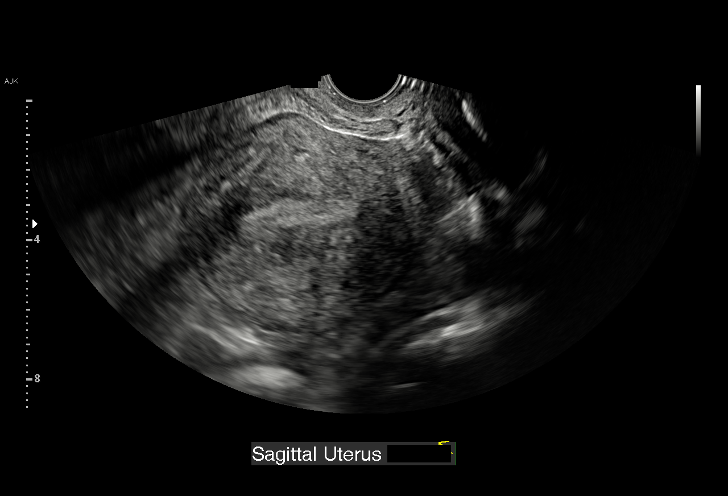
[im 49/98]
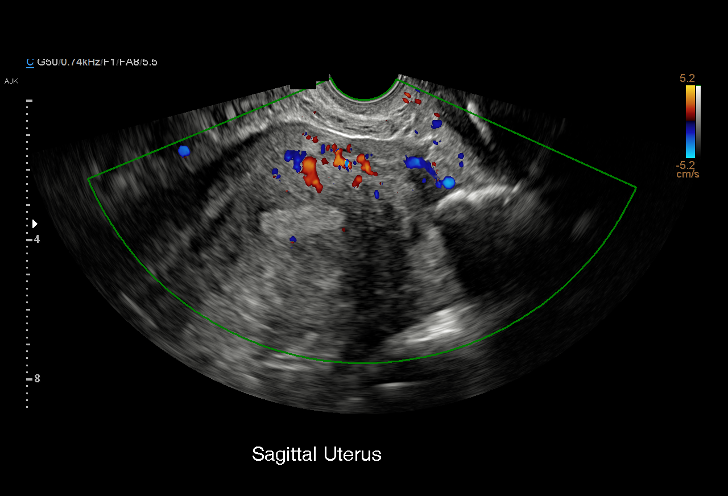
[im 57/98]
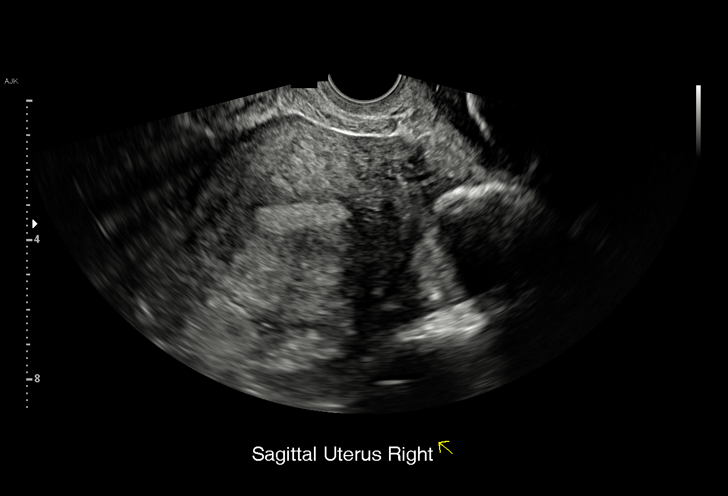
[im 61/98]
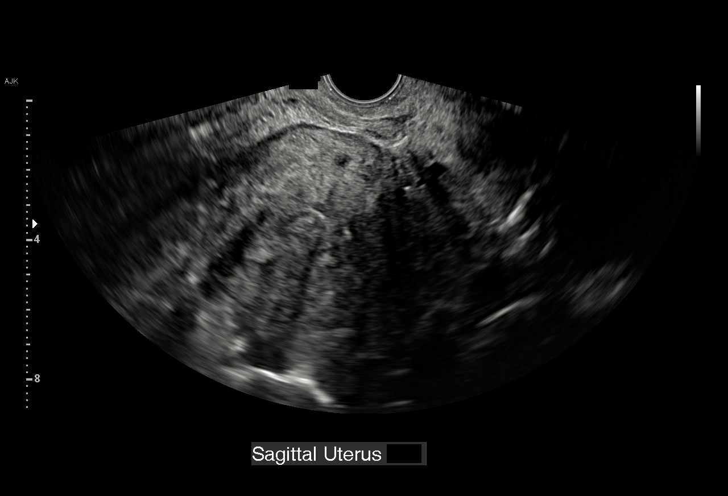
[im 69/98]
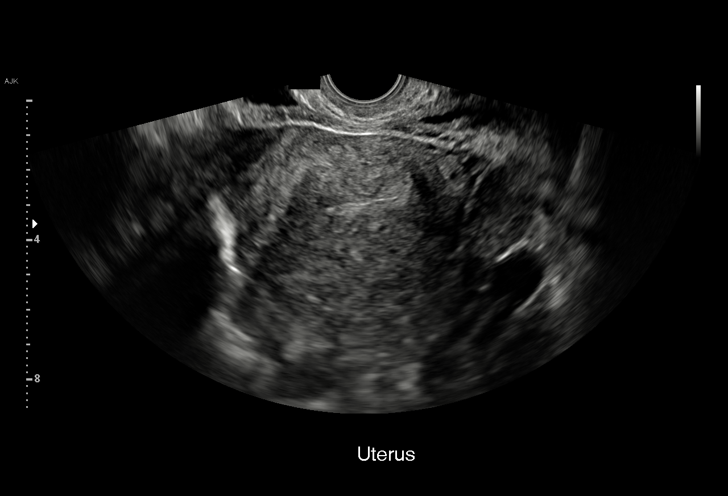
[im 77/98]
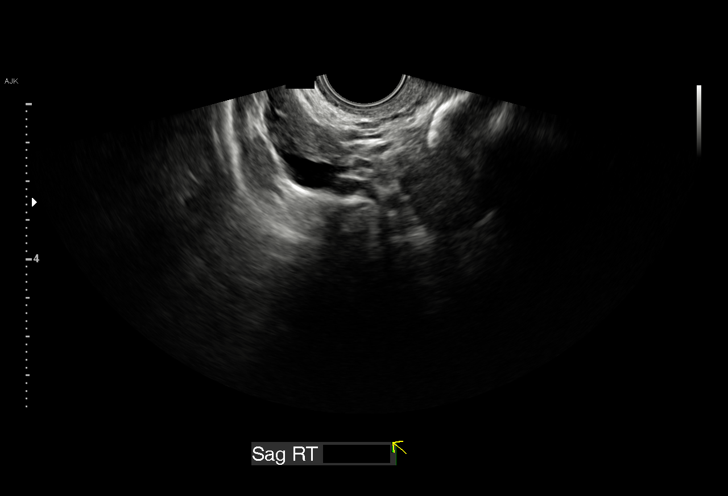
[im 81/98]
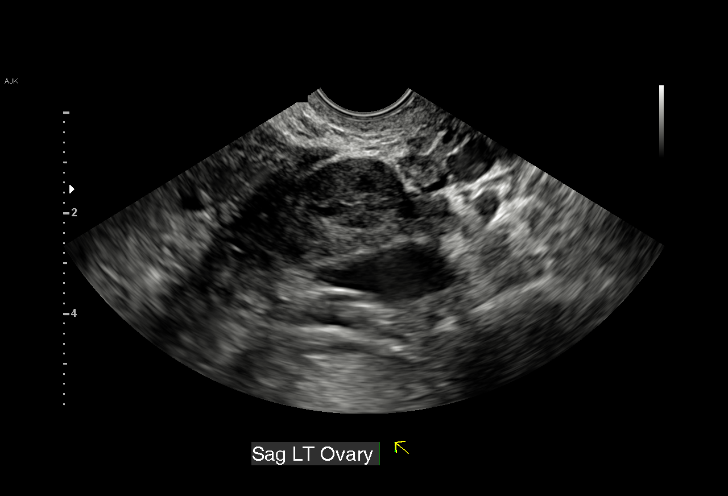
[im 89/98]
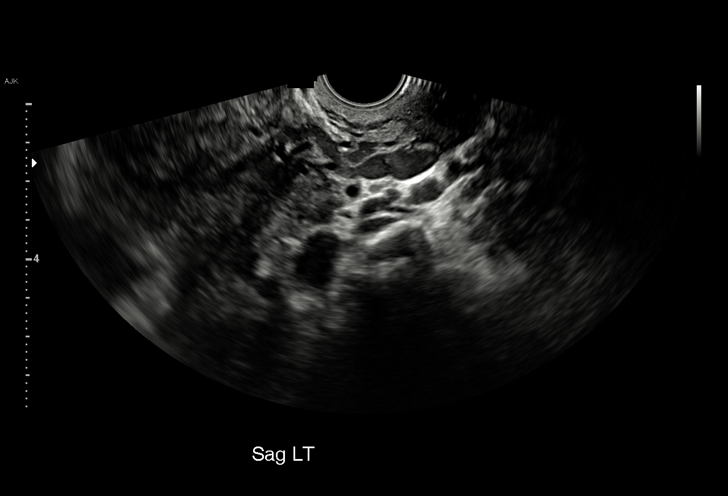
[im 98/98]
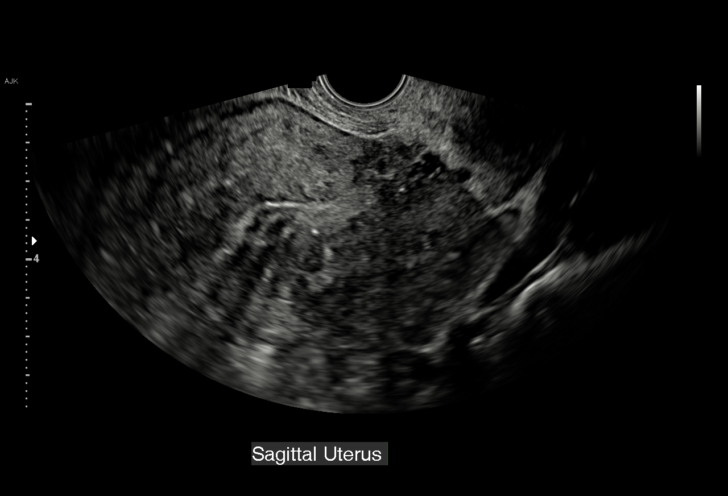

[15 of 25 positions shown; findings below may reference images not displayed]

FINDINGS: Uterus

Measurements: 9.2 x 6.5 x 9.6 cm. Diffusely heterogeneous
echogenicity of uterine myometrium noted. Probable small intramural
fibroid seen the anterior corpus measuring 1.4 cm. A hypoechoic
lesion is also seen in the posterior corpus which measures 1.7 cm
and involves the endometrial stripe. This could represent a
submucosal fibroid or endometrial polyp.

Endometrium

Thickness: 9 mm. 1.7 cm hypoechoic lesion, which may represent a
submucosal fibroid or endometrial polyp.

Right ovary

Measurements: 2.9 x 1.4 x 2.7 cm. Normal appearance/no adnexal mass.

Left ovary

Measurements: 3.5 x 1.8 x 2.7 cm. Normal appearance/no adnexal mass.

Other findings

No abnormal free fluid.
IMPRESSION: Diffusely heterogeneous uterine myometrium, with probable small
anterior intramural fibroid measuring 1.4 cm.

Question 1.7 cm submucosal fibroid versus endometrial polyp.
Consider further evaluation with sonohysterogram. Endometrial
sampling should also be considered if patient is at high risk for
endometrial carcinoma. (Ref: Radiological Reasoning: Algorithmic
Workup of Abnormal Vaginal Bleeding with Endovaginal Sonography and
Sonohysterography. AJR [id number]; 191:S68-73).

Normal appearance of both ovaries.  No adnexal mass identified.

## 2023-06-04 ENCOUNTER — Encounter: Payer: Self-pay | Admitting: Physician Assistant

## 2023-06-10 ENCOUNTER — Ambulatory Visit: Payer: Self-pay

## 2023-06-10 NOTE — Telephone Encounter (Signed)
Chief Complaint: New Pt appt needed Symptoms: Fluid retention  Frequency: comes and goes  Pertinent Negatives: Patient denies urgent need Disposition: [] ED /[] Urgent Care (no appt availability in office) / [x] Appointment(In office/virtual)/ []  [place] [facility] [facility]/ [] Home Care/ [] Refused Recommended Disposition /[] [facility]/ []  Follow-up with PCP Additional Notes: Lynden Ang, RN with [facility] Congregational Nurse program stated the patient just moved back to [place] from [place] and needs to establish care with a PCP. Patient has been without medication for fluid retention for a while and has swelling in the hands and feet. Patient will see a provider tomorrow who will be able to give medication refills. Lynden Ang just want to schedule appointment urgent needs have been taken care of by [facility]. New patient appointment scheduled Thursday 06/12/23  Copied from CRM #[id number]. Topic: Clinical - Red Word Triage >> Jun 10, 2023 11:41 AM Payton Doughty wrote: Red Word that prompted transfer to Nurse Triage: fluid retention, in legs, some in hands, urinary issue, and arthritis pain Reason for Disposition  Requesting regular office appointment  Answer Assessment - Initial Assessment Questions 1. REASON FOR CALL or QUESTION: "What is your reason for calling today?" or "How can I best help you?" or "What question do you have that I can help answer?"     Wants to scheduled new patient appt.  Protocols used: Information Only Call - No Triage-A-AH

## 2023-06-10 NOTE — Congregational Nurse Program (Signed)
  Dept: [phone]   Congregational Nurse Program Note  Date of Encounter: 06/10/2023  Resident recently moved from [place] and has missed dosages of methotrexate for rheumatoid and osteoarthritis. Has pain left hip, knee and ankle, edema both lower extremities, complaint of urinary hesitancy followed by urgency.  Also missed Furosemide since move.  Referred to Lysle Morales MD clinic for 06/11/2023 for immediate needs and made arrangements for initial visit with a PCP to establish care.   Past Medical History: Past Medical History:  Diagnosis Date   Anemia    Arthritis     Encounter Details:  Community Questionnaire - 06/10/23 1120       Questionnaire   Ask client: Do you give verbal consent for me to treat you today? Yes    Student Assistance N/A    Location Patient Served  [facility]    Encounter Setting CN site    Population Status Unhoused    Insurance Medicare    Insurance/Financial Assistance Referral N/A    Medication Have Medication Insecurities    Medical Provider Yes    Screening Referrals Made Annual Wellness Visit    Medical Referrals Made [facility] PCP[facility]Clinic    Medical Appointment Completed N/A    CNP Interventions Advocate/Support;Counsel;Navigate Healthcare System    Screenings CN Performed Blood Pressure    ED Visit Averted Yes    Life-Saving Intervention Made N/A

## 2023-06-11 ENCOUNTER — Encounter: Payer: Self-pay | Admitting: Physician Assistant

## 2023-06-11 ENCOUNTER — Other Ambulatory Visit: Payer: Self-pay | Admitting: Physician Assistant
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
## 2023-06-11 NOTE — Progress Notes (Signed)
Meds updated

## 2023-06-11 NOTE — Progress Notes (Signed)
Pt having problems w/ overflow incontinence. Needs 2XL briefs.  Needs PCP appt, Jan will work on this.   She was given a new rollator, moves much better w/ it.   Due to joint pain and stiffness, was given ibuprofen 200 mg, was recommended to take only 2 pills at a time.   Wants to see orthopedics, was given the number to her old group, [facility].   Is out of her methotrexate, it was explained that the clinic at the Shelter cannot prescribe it, has to be thru a PCP.   Had appt at [facility] at [facility], but it was canceled due to distance. The logistics of going to [facility] are problematic.    Prior to Admission medications   Medication  Calcium Carb-Cholecalciferol (CALCIUM + D3 PO)  cetirizine (ZYRTEC ALLERGY) 10 MG tablet  ibuprofen (ADVIL,MOTRIN) 800 MG tablet  methocarbamol (ROBAXIN) 500 MG tablet  Multiple Vitamins-Minerals (MULTI COMPLETE PO)  OVER THE COUNTER MEDICATION  tobramycin (TOBREX) 0.3 % ophthalmic solution  traMADol (ULTRAM) 50 MG tablet  ferrous sulfate 324 (65 FE) MG TBEC  traZODone (DESYREL) 50 MG tablet   Theodore Demark, PA-C 06/11/2023 3:52 PM

## 2023-06-12 ENCOUNTER — Ambulatory Visit: Payer: Medicare Other | Admitting: Urgent Care

## 2023-06-13 ENCOUNTER — Encounter (HOSPITAL_COMMUNITY): Payer: Self-pay

## 2023-06-13 ENCOUNTER — Emergency Department (HOSPITAL_COMMUNITY)
Admission: EM | Admit: 2023-06-13 | Discharge: 2023-06-13 | Disposition: A | Discharge status: Home / Self Care | Payer: Medicare (Managed Care) | Attending: Emergency Medicine | Admitting: Emergency Medicine

## 2023-06-13 ENCOUNTER — Emergency Department (HOSPITAL_COMMUNITY): Payer: Medicare (Managed Care)

## 2023-06-13 ENCOUNTER — Other Ambulatory Visit: Payer: Self-pay

## 2023-06-13 DIAGNOSIS — Z9989 Dependence on other enabling machines and devices: Secondary | ICD-10-CM | POA: Insufficient documentation

## 2023-06-13 DIAGNOSIS — M2550 Pain in unspecified joint: Secondary | ICD-10-CM

## 2023-06-13 DIAGNOSIS — M25572 Pain in left ankle and joints of left foot: Secondary | ICD-10-CM | POA: Insufficient documentation

## 2023-06-13 DIAGNOSIS — Z59819 Housing instability, housed unspecified: Secondary | ICD-10-CM | POA: Insufficient documentation

## 2023-06-13 DIAGNOSIS — M25562 Pain in left knee: Secondary | ICD-10-CM | POA: Diagnosis not present

## 2023-06-13 DIAGNOSIS — M79662 Pain in left lower leg: Secondary | ICD-10-CM | POA: Diagnosis present

## 2023-06-13 LAB — CBC WITH DIFFERENTIAL/PLATELET
Abs Immature Granulocytes: 0.03 10*3/uL (ref 0.00–0.07)
Basophils Absolute: 0.1 10*3/uL (ref 0.0–0.1)
Basophils Relative: 1 %
Eosinophils Absolute: 0.2 10*3/uL (ref 0.0–0.5)
Eosinophils Relative: 3 %
HCT: 32 % — ABNORMAL LOW (ref 36.0–46.0)
Hemoglobin: 10.1 g/dL — ABNORMAL LOW (ref 12.0–15.0)
Immature Granulocytes: 0 %
Lymphocytes Relative: 24 %
Lymphs Abs: 1.7 10*3/uL (ref 0.7–4.0)
MCH: 32 pg (ref 26.0–34.0)
MCHC: 31.6 g/dL (ref 30.0–36.0)
MCV: 101.3 fL — ABNORMAL HIGH (ref 80.0–100.0)
Monocytes Absolute: 0.7 10*3/uL (ref 0.1–1.0)
Monocytes Relative: 9 %
Neutro Abs: 4.4 10*3/uL (ref 1.7–7.7)
Neutrophils Relative %: 63 %
Platelets: 295 10*3/uL (ref 150–400)
RBC: 3.16 MIL/uL — ABNORMAL LOW (ref 3.87–5.11)
RDW: 18.3 % — ABNORMAL HIGH (ref 11.5–15.5)
WBC: 7 10*3/uL (ref 4.0–10.5)
nRBC: 0 % (ref 0.0–0.2)

## 2023-06-13 LAB — COMPREHENSIVE METABOLIC PANEL
ALT: 18 U/L (ref 0–44)
AST: 28 U/L (ref 15–41)
Albumin: 2.7 g/dL — ABNORMAL LOW (ref 3.5–5.0)
Alkaline Phosphatase: 71 U/L (ref 38–126)
Anion gap: 9 (ref 5–15)
BUN: 13 mg/dL (ref 6–20)
CO2: 24 mmol/L (ref 22–32)
Calcium: 8.7 mg/dL — ABNORMAL LOW (ref 8.9–10.3)
Chloride: 106 mmol/L (ref 98–111)
Creatinine, Ser: 0.84 mg/dL (ref 0.44–1.00)
GFR, Estimated: >60 mL/min (ref >60)
Glucose, Bld: 141 mg/dL — ABNORMAL HIGH (ref 70–99)
Potassium: 3.9 mmol/L (ref 3.5–5.1)
Sodium: 139 mmol/L (ref 135–145)
Total Bilirubin: 0.3 mg/dL (ref 0.0–1.2)
Total Protein: 6.5 g/dL (ref 6.5–8.1)

## 2023-06-13 LAB — URINALYSIS, ROUTINE W REFLEX MICROSCOPIC
Bilirubin Urine: NEGATIVE
Glucose, UA: NEGATIVE mg/dL
Hgb urine dipstick: NEGATIVE
Ketones, ur: NEGATIVE mg/dL
Leukocytes,Ua: NEGATIVE
Nitrite: NEGATIVE
Protein, ur: NEGATIVE mg/dL
Specific Gravity, Urine: 1.019 (ref 1.005–1.030)
pH: 6 (ref 5.0–8.0)

## 2023-06-13 MED ORDER — CELECOXIB 200 MG PO CAPS: 200.0000 mg | ORAL_CAPSULE | Freq: Two times a day (BID) | ORAL | 0 refills | Status: DC

## 2023-06-13 MED ORDER — CELECOXIB 200 MG PO CAPS
200.0000 mg | ORAL_CAPSULE | Freq: Two times a day (BID) | ORAL | Status: DC
  Administered 2023-06-13: 200 mg via ORAL
  Filled 2023-06-13: qty 1

## 2023-06-13 MED ORDER — METHOTREXATE SODIUM 2.5 MG PO TABS
25.0000 mg | ORAL_TABLET | ORAL | Status: AC
Start: 2023-06-13 — End: 2023-06-13
  Administered 2023-06-13: 25 mg via ORAL
  Filled 2023-06-13: qty 10

## 2023-06-13 MED ORDER — METHOTREXATE SODIUM 2.5 MG PO TABS: 25.0000 mg | ORAL_TABLET | ORAL | 0 refills | Status: DC

## 2023-06-13 MED ORDER — NYSTATIN 100000 UNIT/GM EX POWD: 1.0000 | Freq: Three times a day (TID) | CUTANEOUS | 0 refills | Status: DC

## 2023-06-13 MED ORDER — OXYCODONE-ACETAMINOPHEN 5-325 MG PO TABS
1.0000 | ORAL_TABLET | Freq: Once | ORAL | Status: AC
  Administered 2023-06-13: 1 via ORAL
  Filled 2023-06-13: qty 1

## 2023-06-13 NOTE — Discharge Instructions (Signed)
Your evaluation in the ED today was reassuring. Resume your Methotrexate on 06/20/23 and take as prescribed. Use Celebrex for pain control. We advise follow up with a primary care doctor. Return for any new or concerning symptoms.

## 2023-06-13 NOTE — ED Provider Notes (Signed)
 [place] EMERGENCY DEPARTMENT AT [facility] Provider Note   CSN: [id number] Arrival date & time: 06/13/23  1211     History  Chief Complaint  Patient presents with   Generalized Body Aches    Anne Brewer is a 53 y.o. female.  53 y/o female with hx of rheumatoid arthritis (self reported) and anemia presents to the ED for evaluation of pain to the LLE which began this morning and has been constant. Pain worse in the L knee and foot, aggravated by weight bearing. Pain unrelieved by OTC APAP and NSAIDs. She denies any fall, trauma, injury, or associated fever. Has had similar pain in the past associated with her RA. Has not been taking her Methotrexate or Celebrex in 3 weeks as she ran out of these medications after relocating back to [place] a few weeks ago. She also reports feeling "swollen", though endorses a 80-90 pound weight gain in the past year. Denies chest pain. Ambulates with a rolling walker for stability.  Was previously in [place], staying with her son. She is currently living in a local area shelter. Has not established care with a local PCP.  The history is provided by the patient. No language interpreter was used.       Home Medications Prior to Admission medications   Medication Sig Start Date End Date Taking? Authorizing Provider  celecoxib (CELEBREX) 200 MG capsule Take 1 capsule (200 mg total) by mouth 2 (two) times daily. 06/13/23  Yes Antony Madura, PA-C  methotrexate (RHEUMATREX) 2.5 MG tablet Take 10 tablets (25 mg total) by mouth once a week. Caution:Chemotherapy. Protect from light. 06/13/23  Yes Antony Madura, PA-C  Calcium Carb-Cholecalciferol (CALCIUM + D3 PO) Take 1 tablet by mouth 2 (two) times daily.    [provider]  cetirizine (ZYRTEC ALLERGY) 10 MG tablet Take 1 tablet (10 mg total) by mouth daily. 10/14/16   Elvina Sidle, MD  ferrous sulfate 324 (65 Fe) MG TBEC One tablet bid for iron poor blood. 06/11/23 06/11/23   Barrett, Joline Salt, PA-C  ibuprofen (ADVIL,MOTRIN) 800 MG tablet Take 800 mg by mouth daily as needed. 05/10/16   [provider]  methocarbamol (ROBAXIN) 500 MG tablet Take 1 tablet (500 mg total) by mouth 4 (four) times daily. 07/12/15   Cristie Hem, PA-C  Multiple Vitamins-Minerals (MULTI COMPLETE PO) Take by mouth daily.     [provider]  tobramycin (TOBREX) 0.3 % ophthalmic solution Place 1 drop into the right eye every 4 (four) hours. 10/14/16   Elvina Sidle, MD  traMADol (ULTRAM) 50 MG tablet Take 1 tablet (50 mg total) by mouth every 6 (six) hours as needed. 12/10/16   Hayden Rasmussen, NP  traZODone (DESYREL) 50 MG tablet Take 1 tablet (50 mg total) by mouth at bedtime. 06/11/23 06/11/23  Barrett, Joline Salt, PA-C      Allergies    Shellfish allergy and Lactose intolerance (gi)    Review of Systems   Review of Systems Ten systems reviewed and are negative for acute change, except as noted in the HPI.    Physical Exam Updated Vital Signs BP (!) 148/75   Pulse 90   Temp 97.7 F (36.5 C) (Oral)   Resp 20   Ht 5\' 8"  (1.727 m)   Wt 127 kg   LMP 10/06/2016   SpO2 100%   BMI 42.57 kg/m   Physical Exam Vitals and nursing note reviewed.  Constitutional:      General: She  is not in acute distress.    Appearance: She is well-developed. She is not diaphoretic.     Comments: Morbidly obese, nontoxic appearing AA female.  HENT:     Head: Normocephalic and atraumatic.  Eyes:     General: No scleral icterus.    Conjunctiva/sclera: Conjunctivae normal.  Cardiovascular:     Rate and Rhythm: Normal rate and regular rhythm.     Pulses: Normal pulses.     Comments: DP pulse 2+ BLE Pulmonary:     Effort: Pulmonary effort is normal. No respiratory distress.     Comments: Respirations even and unlabored. Musculoskeletal:        General: Normal range of motion.     Cervical back: Normal range of motion.     Comments: Preserved ROM of the LLE without erythema, heat  to touch, lymphangitic streaking of the L hip, knee, ankle, or foot. No palpable effusion of the L knee; exam limited 2/2 habitus. No crepitus or deformity of LLE.  Skin:    General: Skin is warm and dry.     Coloration: Skin is not pale.     Findings: No erythema or rash.  Neurological:     Mental Status: She is alert and oriented to person, place, and time.     Coordination: Coordination normal.  Psychiatric:        Behavior: Behavior normal.     ED Results / Procedures / Treatments   Labs (all labs ordered are listed, but only abnormal results are displayed) Labs Reviewed  COMPREHENSIVE METABOLIC PANEL - Abnormal; Notable for the following components:      Result Value   Glucose, Bld 141 (*)    Calcium 8.7 (*)    Albumin 2.7 (*)    All other components within normal limits  CBC WITH DIFFERENTIAL/PLATELET - Abnormal; Notable for the following components:   RBC 3.16 (*)    Hemoglobin 10.1 (*)    HCT 32.0 (*)    MCV 101.3 (*)    RDW 18.3 (*)    All other components within normal limits  URINALYSIS, ROUTINE W REFLEX MICROSCOPIC    EKG None  Radiology DG Foot Complete Left Result Date: 06/13/2023 CLINICAL DATA:  Left foot pain. EXAM: LEFT FOOT - COMPLETE 3+ VIEW COMPARISON:  11/21/2014 FINDINGS: There is no evidence of fracture or dislocation. Minor degenerative change of the first metatarsal phalangeal joint. There is chronic spurring about the dorsal talonavicular. No erosive change. Mild soft tissue edema. IMPRESSION: 1. Mild soft tissue edema. No acute osseous abnormality. 2. Chronic talonavicular spurring. Minor degenerative change of the first metatarsophalangeal joint. Electronically Signed   By: Narda Rutherford M.D.   On: 06/13/2023 14:45   DG Knee Complete 4 Views Left Result Date: 06/13/2023 CLINICAL DATA:  Left knee pain. EXAM: LEFT KNEE - COMPLETE 4+ VIEW COMPARISON:  None Available. FINDINGS: Tricompartmental joint space narrowing and peripheral spurring. No  fracture. No erosion or focal bone abnormality. There is a moderate joint effusion. Generalized soft tissue edema. IMPRESSION: 1. Tricompartmental osteoarthritis. 2. Moderate joint effusion. Electronically Signed   By: Narda Rutherford M.D.   On: 06/13/2023 14:43    Procedures Procedures    Medications Ordered in ED Medications  oxyCODONE-acetaminophen (PERCOCET/ROXICET) 5-325 MG per tablet 1 tablet (has no administration in time range)  celecoxib (CELEBREX) capsule 200 mg (has no administration in time range)  methotrexate (RHEUMATREX) tablet 25 mg (has no administration in time range)    ED Course/ Medical Decision Making/ A&P  Medical Decision Making Risk Prescription drug management.   This patient presents to the ED for concern of joint pain, this involves an extensive number of treatment options, and is a complaint that carries with it a high risk of complications and morbidity.  The differential diagnosis includes RA flare vs sprain/strain vs fracture vs dislocation vs septic joint.   Co morbidities that complicate the patient evaluation  Anemia Rheumatoid arthritis   Additional history obtained:  External records from outside source obtained and reviewed including outreach notes from 06/04/23 and 06/11/23 verifying daily medications and attempts at coordinating PCP evaluations.   Lab Tests:  I Ordered, and personally interpreted labs.  The pertinent results include:  Hgb 10.1 (stable x 7 years)   Imaging Studies ordered:  I ordered imaging studies including Xray L knee, L foot  I independently visualized and interpreted imaging which showed arthritic changes without bony deformity or fracture. I agree with the radiologist interpretation   Cardiac Monitoring:  The patient was maintained on a cardiac monitor.  I personally viewed and interpreted the cardiac monitored which showed an underlying rhythm of: NSR   Medicines ordered  and prescription drug management:  I ordered medication including Percocet for pain. Resumed patient on Celebrex and Methotrexate regimen. Reevaluation of the patient after these medicines showed that the patient improved I have reviewed the patients home medicines and have made adjustments as needed   Test Considered:  Urine preg   Problem List / ED Course:  53 y/o female, currently living in a shelter, presenting for LLE pain. Has had similar pain in the past associated with her RA; out of maintenance medications since relocating to the area from [place]. Neurovascularly intact on exam. No hx of trauma. No redness, heat to touch. AROM and PROM intact. Afebrile today and without leukocytosis. Doubt septic joint. Xrays obtained in triage which show arthritic changes without evidence of fx, dislocation. Hx of similar pain 2/2 RA; will resume MTX and Celebrex.  Reporting urinary frequency in triage. No mention of this during my encounter with patient; however, UA was obtained which is not concerning for acute cystitis. CBG stable; not a diabetic. Per prior outpatient notes, patient with complaints of urge incontinence with community outreach providers. This can be further assessed by outpatient PCP. Do not feel further emergent work up is indicated. Given financial assist resource guide as well as local PCP options.   Reevaluation:  After the interventions noted above, I reevaluated the patient and found that they have :stayed the same   Social Determinants of Health:  Housing instability Ambulatory with assist device   Dispostion:  After consideration of the diagnostic results and the patients response to treatment, I feel that the patent would benefit from outpatient PCP evaluation. Refilled Methotrexate and Celebrex x 1 month supply until patient able to establish care with PCP. Return precautions discussed and provided. Patient discharged in stable condition with no unaddressed  concerns.          Final Clinical Impression(s) / ED Diagnoses Final diagnoses:  Joint pain of lower extremity  Housing instability    Rx / DC Orders ED Discharge Orders          Ordered    celecoxib (CELEBREX) 200 MG capsule  2 times daily        06/13/23 2228    methotrexate (RHEUMATREX) 2.5 MG tablet  Weekly        06/13/23 2228  Antony Madura, PA-C 06/13/23 2304    Nira Conn, MD 06/14/23 [phone]

## 2023-06-13 NOTE — ED Provider Triage Note (Signed)
Emergency Medicine Provider Triage Evaluation Note  Anne Brewer , a 53 y.o. female  was evaluated in triage.  Pt complains of RA and increased urination.  Patient reports she was diagnosed with RA in [place] but recently moved out here.  She would like a second opinion and to establish care.  She also endorses increased urination without dysuria.    Review of Systems  Positive: Left knee pain, left foot pain, increased urination Negative: Back pain, chest pain, shortness of breath  Physical Exam  BP (!) 164/105   Pulse 85   Temp 98.3 F (36.8 C)   Resp 20   Ht 5\' 8"  (1.727 m)   Wt 127 kg   LMP 10/06/2016   SpO2 98%   BMI 42.57 kg/m  Gen:   Awake, no distress   Resp:  Normal effort  MSK:   Moves extremities without difficulty  Other:    Medical Decision Making  Medically screening exam initiated at 12:37 PM.  Appropriate orders placed.  Anne Brewer was informed that the remainder of the evaluation will be completed by another provider, this initial triage assessment does not replace that evaluation, and the importance of remaining in the ED until their evaluation is complete.   Maxwell Marion, PA-C 06/13/23 1448

## 2023-06-13 NOTE — ED Triage Notes (Signed)
Pt states she has been diagnosed with rheumatoid osteoarthritis and has not had her methotrexate or celebrex. Pt states she needs to see a rheumatologist. Pt denies nausea, vomiting, or fevers. Pt states she has had urinary frequency and urinary incontinence. Pt states her legs are swollen as well.

## 2023-06-17 NOTE — Congregational Nurse Program (Signed)
  Dept: [phone]   Congregational Nurse Program Note  Date of Encounter: 06/17/2023  Clinic visit for complaint of left knee pain, currently using Voltaren  Past Medical History: Past Medical History:  Diagnosis Date   Anemia    Arthritis     Encounter Details:

## 2023-06-19 ENCOUNTER — Encounter: Payer: Self-pay | Admitting: Internal Medicine

## 2023-06-19 ENCOUNTER — Encounter: Payer: Self-pay | Admitting: Critical Care Medicine

## 2023-06-19 NOTE — Progress Notes (Signed)
 53 year old seen at [facility].  Patient was seen 06/12/2023  She wanted to follow up in regards where we are with appointment for PCP. Patient was informed appointment will be made today.   Complaints of hands dryness, lotions for hands was provided.

## 2023-06-20 NOTE — Progress Notes (Signed)
 The patient was given more Voltaren gel topical for severe arthritis and reminded of her upcoming appointment with primary care

## 2023-06-23 ENCOUNTER — Encounter: Payer: Self-pay | Admitting: *Deleted

## 2023-06-23 NOTE — Congregational Nurse Program (Signed)
  Dept: [phone]   Congregational Nurse Program Note  Date of Encounter: 06/18/2023  Past Medical History: Past Medical History:  Diagnosis Date   Anemia    Arthritis     Encounter Details:  Community Questionnaire - 06/18/23 0935       Questionnaire   Ask client: Do you give verbal consent for me to treat you today? Yes    Student Assistance N/A    Location Patient Served  [facility]    Encounter Setting CN site    Population Status Unhoused    Insurance Medicare    Insurance/Financial Assistance Referral N/A    Medication N/A    Medical Provider Yes    Screening Referrals Made N/A    Medical Referrals Made N/A    Medical Appointment Completed N/A    CNP Interventions Advocate/Support    Screenings CN Performed N/A    ED Visit Averted N/A    Life-Saving Intervention Made N/A           Client asking for briefs and nutritional drink. Given as requested. Offered support and encouragement. Sherly Brodbeck W RN CN

## 2023-06-23 NOTE — Congregational Nurse Program (Signed)
  Dept: [phone]   Congregational Nurse Program Note  Date of Encounter: 06/16/23  Past Medical History: Past Medical History:  Diagnosis Date   Anemia    Arthritis     Encounter Details:  Assisted client with PCP appt for 4/28. Appointed was changed by MD to 3/5. Written information was given to client.

## 2023-07-01 NOTE — Congregational Nurse Program (Signed)
  Dept: [phone]   Congregational Nurse Program Note  Date of Encounter: 07/01/2023  Follow-up visit to confirm arrangements for PCP initial visit on 07/02/2023.  States she has arranged a taxi for the visit but needed to know the time of the appointment.  Given information to go to Primary Care at [facility] at 2:40P tomorrow.  Past Medical History: Past Medical History:  Diagnosis Date   Anemia    Arthritis     Encounter Details:  Community Questionnaire - 07/01/23 1135       Questionnaire   Ask client: Do you give verbal consent for me to treat you today? Yes    Student Assistance N/A    Location Patient Served  [facility]    Encounter Setting CN site    Population Status Unhoused    Insurance Medicare    Insurance/Financial Assistance Referral N/A    Medication N/A    Medical Provider Yes    Screening Referrals Made N/A    Medical Referrals Made N/A    Medical Appointment Completed N/A    CNP Interventions Advocate/Support;Counsel    Screenings CN Performed N/A    ED Visit Averted N/A    Life-Saving Intervention Made N/A

## 2023-07-02 ENCOUNTER — Ambulatory Visit (INDEPENDENT_AMBULATORY_CARE_PROVIDER_SITE_OTHER): Payer: Self-pay | Admitting: Family

## 2023-07-02 ENCOUNTER — Ambulatory Visit: Payer: Medicare (Managed Care) | Admitting: Family

## 2023-07-02 VITALS — BP 141/92 | HR 102 | Temp 98.2°F | Ht 68.0 in | Wt 293.0 lb

## 2023-07-02 DIAGNOSIS — R21 Rash and other nonspecific skin eruption: Secondary | ICD-10-CM

## 2023-07-02 DIAGNOSIS — M069 Rheumatoid arthritis, unspecified: Secondary | ICD-10-CM | POA: Diagnosis not present

## 2023-07-02 DIAGNOSIS — Z7689 Persons encountering health services in other specified circumstances: Secondary | ICD-10-CM | POA: Diagnosis not present

## 2023-07-02 DIAGNOSIS — M1990 Unspecified osteoarthritis, unspecified site: Secondary | ICD-10-CM

## 2023-07-02 MED ORDER — CELECOXIB 200 MG PO CAPS: 200.0000 mg | ORAL_CAPSULE | Freq: Two times a day (BID) | ORAL | 2 refills | Status: DC

## 2023-07-02 MED ORDER — METHOTREXATE SODIUM 2.5 MG PO TABS: 25.0000 mg | ORAL_TABLET | ORAL | 2 refills | Status: DC
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
## 2023-07-03 ENCOUNTER — Other Ambulatory Visit (HOSPITAL_COMMUNITY): Payer: Self-pay | Admitting: Emergency Medicine

## 2023-07-03 ENCOUNTER — Encounter: Payer: Self-pay | Admitting: Physician Assistant

## 2023-07-03 NOTE — Progress Notes (Signed)
 error
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
## 2023-07-09 ENCOUNTER — Telehealth: Payer: Self-pay

## 2023-07-09 ENCOUNTER — Encounter: Payer: Self-pay | Admitting: Physician Assistant

## 2023-07-09 ENCOUNTER — Other Ambulatory Visit: Payer: Self-pay | Admitting: Physician Assistant

## 2023-07-09 ENCOUNTER — Telehealth: Payer: Self-pay | Admitting: Family

## 2023-07-09 ENCOUNTER — Encounter: Payer: Self-pay | Admitting: *Deleted

## 2023-07-09 MED ORDER — FERROUS SULFATE 324 (65 FE) MG PO TBEC: DELAYED_RELEASE_TABLET | ORAL | 0 refills | Status: DC
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
## 2023-07-09 NOTE — Telephone Encounter (Signed)
 Copied from CRM [id number]. Topic: Medical Record Request - Provider/Facility Request >> Jul 09, 2023 11:07 AM Gildardo Pounds wrote: Reason for CRM: Alanna from [facility] needs the medical records from previous rheumatologist for patient. Callback number is [phone] ext [phone]; Fax [phone] attention [facility]

## 2023-07-09 NOTE — Telephone Encounter (Unsigned)
 Copied from CRM [id number]. Topic: Medical Record Request - Provider/Facility Request >> Jul 09, 2023 11:07 AM Gildardo Pounds wrote: Reason for CRM: Alanna from [facility] needs the medical records from previous rheumatologist for patient. Callback number is [phone] ext [phone]; Fax [phone] attention [facility]

## 2023-07-09 NOTE — Congregational Nurse Program (Signed)
  Dept: [phone]   Congregational Nurse Program Note  Date of Encounter: 07/09/2023  Past Medical History: Past Medical History:  Diagnosis Date   Anemia    Arthritis     Encounter Details:  Community Questionnaire - 07/09/23 1012       Questionnaire   Ask client: Do you give verbal consent for me to treat you today? Yes    Student Assistance N/A    Location Patient Served  [facility]    Encounter Setting CN site    Population Status Unhoused    Insurance Medicare    Insurance/Financial Assistance Referral N/A    Medication Have Medication Insecurities    Medical Provider Yes    Screening Referrals Made N/A    Medical Referrals Made [facility]    Medical Appointment Completed N/A    CNP Interventions Advocate/Support    Screenings CN Performed Blood Pressure    ED Visit Averted N/A    Life-Saving Intervention Made N/A            Client signed form to see MD in [facility] clinic today about cream for her arthritis. Completed triage form for MD. Blood pressure 135/86, pulse 92, last menstrual period 10/06/2016, SpO2 97%.  Tariana Moldovan W RN CN

## 2023-07-09 NOTE — Progress Notes (Signed)
 See note

## 2023-07-15 ENCOUNTER — Telehealth: Payer: Self-pay

## 2023-07-15 NOTE — Telephone Encounter (Signed)
 Copied from CRM [id number]. Topic: Medical Record Request - Provider/Facility Request >> Jul 09, 2023 11:07 AM Gildardo Pounds wrote: Reason for CRM: Alanna from [facility] needs the medical records from previous rheumatologist for patient. Callback number is [phone] ext [phone]; Fax [phone] attention [facility] >> Jul 15, 2023 10:52 AM Almira Coaster wrote: Charlean Sanfilippo from [facility] is calling to follow up on this request, they are unable to schedule the patient for a rheumatologist  appointment without previous records. Please contact with an update.

## 2023-07-15 NOTE — Telephone Encounter (Signed)
 Spoke to [employer] and informed her that I dont see any notes for this. She said she will follow up with the patient.

## 2023-07-15 NOTE — Telephone Encounter (Signed)
 I dont see any rheumatologist notes.

## 2023-07-16 ENCOUNTER — Encounter: Payer: Self-pay | Admitting: Physician Assistant

## 2023-07-16 ENCOUNTER — Encounter: Payer: Self-pay | Admitting: *Deleted
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
## 2023-07-16 NOTE — Congregational Nurse Program (Signed)
  Dept: [phone]   Congregational Nurse Program Note  Date of Encounter: 07/16/2023  Past Medical History: Past Medical History:  Diagnosis Date   Anemia    Arthritis     Encounter Details:  Community Questionnaire - 07/16/23 1218       Questionnaire   Ask client: Do you give verbal consent for me to treat you today? Yes    Student Assistance N/A    Location Patient Served  [facility]    Encounter Setting CN site    Population Status Unhoused    Insurance Medicare    Insurance/Financial Assistance Referral N/A    Medication N/A    Medical Provider Yes    Screening Referrals Made N/A    Medical Referrals Made [facility]    Medical Appointment Completed N/A    CNP Interventions Advocate/Support    Screenings CN Performed Blood Pressure    ED Visit Averted N/A    Life-Saving Intervention Made N/A            Client requested to see MD in [facility] clinic today. Completed triage form to see MD.Blood pressure (!) 150/89, pulse 97, last menstrual period 10/06/2016, SpO2 99%.  Nakkia Mackiewicz W RN CN

## 2023-07-18 NOTE — Telephone Encounter (Signed)
 I talked to [facility] and informed them we dont have these notes.

## 2023-07-22 ENCOUNTER — Telehealth: Payer: Self-pay | Admitting: Family

## 2023-07-22 NOTE — Telephone Encounter (Signed)
 Copied from CRM [id number]. Topic: Referral - Status >> Jul 22, 2023  3:38 PM Fuller Mandril wrote: Reason for CRM: Provider office called about referral received. Need additional information. Notes from Rheumatology. Have not been able to make contact with patient. Closing out referral for now.

## 2023-07-23 NOTE — Telephone Encounter (Signed)
 I spoke to Anne Brewer about use not having her notes for that provider.

## 2023-07-24 ENCOUNTER — Encounter: Payer: Self-pay | Admitting: Physician Assistant

## 2023-07-24 NOTE — Progress Notes (Addendum)
 Pt c/o nasal stuffiness, drainage down her throat, runny nose. Slightly scratchy throat.  At first, she saw some greenish sputum, now it is clear.   Says she cannot lie flat because of the drainage.   Was on Allegra which helped, but has run out. She was given Allegra (generic) and saline nasal spray as well as ibuprofen.     07/22/2023   12:26 PM 07/16/2023   12:13 PM 07/09/2023   10:11 AM  Vitals with BMI  Systolic  150 135  Diastolic  89 86  Pulse 89 97 92   Notes from [facility] show they have not been able to get in touch w/ her regarding Rheumatology records.  Pt given the phone number for [facility] >> she called and was given the fax number as well. They want the records from her previous Rheumatologist before they will make an appt. Pt aware and will work on it.   Theodore Demark, PA-C 07/24/2023 10:56 AM  Patient seen and examined. Presents with post nasal drip, allergies. Report cough. Lungs Clear. Oxygen sat normal. Allergies meds provided.

## 2023-07-31 NOTE — Congregational Nurse Program (Signed)
  Dept: [phone]   Congregational Nurse Program Note  Date of Encounter: 07/22/2023  Clinic visit for complaint of nasal drainage and cough.  Temperature 98.4, O2 Sat 98%, pulse 89.  Advised to go to make appointment with PCP but if symptoms worsen to go to urgent care. Past Medical History: Past Medical History:  Diagnosis Date   Anemia    Arthritis     Encounter Details:

## 2023-08-05 ENCOUNTER — Encounter: Payer: Self-pay | Admitting: Family

## 2023-08-05 ENCOUNTER — Ambulatory Visit (INDEPENDENT_AMBULATORY_CARE_PROVIDER_SITE_OTHER): Payer: Self-pay | Admitting: Family

## 2023-08-05 VITALS — BP 141/81 | HR 60 | Temp 97.9°F | Resp 18 | Ht 68.0 in | Wt 293.0 lb

## 2023-08-05 DIAGNOSIS — R21 Rash and other nonspecific skin eruption: Secondary | ICD-10-CM

## 2023-08-05 DIAGNOSIS — M25572 Pain in left ankle and joints of left foot: Secondary | ICD-10-CM

## 2023-08-05 DIAGNOSIS — G8929 Other chronic pain: Secondary | ICD-10-CM

## 2023-08-05 DIAGNOSIS — G894 Chronic pain syndrome: Secondary | ICD-10-CM

## 2023-08-05 DIAGNOSIS — R609 Edema, unspecified: Secondary | ICD-10-CM

## 2023-08-05 DIAGNOSIS — M79672 Pain in left foot: Secondary | ICD-10-CM

## 2023-08-05 DIAGNOSIS — M79671 Pain in right foot: Secondary | ICD-10-CM

## 2023-08-05 DIAGNOSIS — M25562 Pain in left knee: Secondary | ICD-10-CM

## 2023-08-05 DIAGNOSIS — M069 Rheumatoid arthritis, unspecified: Secondary | ICD-10-CM

## 2023-08-05 DIAGNOSIS — M25561 Pain in right knee: Secondary | ICD-10-CM

## 2023-08-05 DIAGNOSIS — M25552 Pain in left hip: Secondary | ICD-10-CM

## 2023-08-05 DIAGNOSIS — R030 Elevated blood-pressure reading, without diagnosis of hypertension: Secondary | ICD-10-CM

## 2023-08-05 DIAGNOSIS — M25571 Pain in right ankle and joints of right foot: Secondary | ICD-10-CM

## 2023-08-05 MED ORDER — NYSTATIN-TRIAMCINOLONE 100000-0.1 UNIT/GM-% EX OINT
1.0000 | TOPICAL_OINTMENT | Freq: Two times a day (BID) | CUTANEOUS | 0 refills | Status: DC
Start: 2023-08-05 — End: 2023-08-22

## 2023-08-05 MED ORDER — FUROSEMIDE 20 MG PO TABS: 20.0000 mg | ORAL_TABLET | Freq: Every day | ORAL | 0 refills | Status: DC
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
## 2023-08-05 NOTE — Progress Notes (Unsigned)
 Patient stated she in a lot of pain that's a 9.  Patient has a lot of fluid in her legs.  She stated she needs Lasix.  Patient needs to she dermatologist about under her breast, feet, and buttocks.  Patient needs medication refill.  Needs a referral to [facility]. Patient need a different referral Rheumatology.  Patient needs orthopedics shoes.

## 2023-08-06 MED ORDER — SULFASALAZINE 500 MG PO TBEC: 500.0000 mg | DELAYED_RELEASE_TABLET | Freq: Two times a day (BID) | ORAL | 0 refills | Status: DC

## 2023-08-06 MED ORDER — PREGABALIN 25 MG PO CAPS
25.0000 mg | ORAL_CAPSULE | Freq: Three times a day (TID) | ORAL | 0 refills | Status: DC
Start: 2023-08-06 — End: 2023-08-13

## 2023-08-13 ENCOUNTER — Encounter: Payer: Self-pay | Admitting: *Deleted

## 2023-08-13 ENCOUNTER — Other Ambulatory Visit: Payer: Self-pay | Admitting: Critical Care Medicine

## 2023-08-13 ENCOUNTER — Other Ambulatory Visit: Payer: Self-pay

## 2023-08-13 ENCOUNTER — Encounter: Payer: Self-pay | Admitting: Physician Assistant

## 2023-08-13 DIAGNOSIS — M069 Rheumatoid arthritis, unspecified: Secondary | ICD-10-CM

## 2023-08-13 MED ORDER — PREGABALIN 25 MG PO CAPS
25.0000 mg | ORAL_CAPSULE | Freq: Three times a day (TID) | ORAL | 0 refills | Status: DC
  Filled 2023-08-13: qty 90, 30d supply, fill #0

## 2023-08-13 NOTE — Congregational Nurse Program (Signed)
      Dept: [phone]   Congregational Nurse Program Note  Date of Encounter: 08/13/2023  Past Medical History: Past Medical History:  Diagnosis Date   Anemia    Arthritis     Encounter Details:  Community Questionnaire - 08/13/23 1140       Questionnaire   Ask client: Do you give verbal consent for me to treat you today? Yes    Student Assistance N/A    Location Patient Served  [facility]    Encounter Setting CN site    Population Status Unhoused    Insurance Medicare    Insurance/Financial Assistance Referral N/A    Medication Have Medication Insecurities    Medical Provider Yes    Screening Referrals Made N/A    Medical Referrals Made [facility]    Medical Appointment Completed N/A    CNP Interventions Advocate/Support    Screenings CN Performed Blood Pressure    ED Visit Averted N/A    Life-Saving Intervention Made N/A               Dept: [phone]   Congregational Nurse Program Note  Date of Encounter: 08/13/2023  Past Medical History: Past Medical History:  Diagnosis Date   Anemia    Arthritis     Encounter Details:   Client requested to see MD in [facility] clinic. Completed triage form. Blood pressure (!) 157/103, pulse 84, last menstrual period 10/06/2016, SpO2 98%.  Aimar Shrewsbury W RN CN
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
## 2023-08-14 ENCOUNTER — Encounter: Payer: Self-pay | Admitting: *Deleted

## 2023-08-14 ENCOUNTER — Other Ambulatory Visit: Payer: Self-pay

## 2023-08-14 NOTE — Congregational Nurse Program (Signed)
  Dept: [phone]   Congregational Nurse Program Note  Date of Encounter: 08/14/2023  Past Medical History: Past Medical History:  Diagnosis Date   Anemia    Arthritis     Encounter Details:  Community Questionnaire - 08/14/23 1203       Questionnaire   Ask client: Do you give verbal consent for me to treat you today? Yes    Student Assistance N/A    Location Patient Served  [facility]    Encounter Setting CN site    Population Status Unhoused    Insurance Medicare    Insurance/Financial Assistance Referral N/A    Medication Have Medication Insecurities;Patient Medications Reviewed;Provided Medication Assistance    Medical Provider Yes    Screening Referrals Made N/A    Medical Referrals Made N/A    Medical Appointment Completed N/A    CNP Interventions Advocate/Support;Case Management;Navigate Healthcare System    Screenings CN Performed N/A    ED Visit Averted N/A    Life-Saving Intervention Made N/A            Medication Lyrica picked up from [facility] pharmacy and brought to client at [facility] shelter. Client signed receipt of medication and has no questions about medication. Tymeer Vaquera W RN CN

## 2023-08-20 ENCOUNTER — Encounter: Payer: Self-pay | Admitting: *Deleted
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
## 2023-08-21 ENCOUNTER — Ambulatory Visit (HOSPITAL_COMMUNITY)
Admission: EM | Admit: 2023-08-21 | Discharge: 2023-08-22 | Disposition: A | Discharge status: Home / Self Care | Attending: Behavioral Health | Admitting: Behavioral Health

## 2023-08-21 ENCOUNTER — Encounter: Payer: Self-pay | Admitting: Critical Care Medicine

## 2023-08-21 DIAGNOSIS — F22 Delusional disorders: Secondary | ICD-10-CM | POA: Diagnosis not present

## 2023-08-21 DIAGNOSIS — F29 Unspecified psychosis not due to a substance or known physiological condition: Secondary | ICD-10-CM | POA: Diagnosis not present

## 2023-08-21 LAB — COMPREHENSIVE METABOLIC PANEL WITH GFR
ALT: 22 U/L (ref 0–44)
AST: 22 U/L (ref 15–41)
Albumin: 3.5 g/dL (ref 3.5–5.0)
Alkaline Phosphatase: 111 U/L (ref 38–126)
Anion gap: 9 (ref 5–15)
BUN: 16 mg/dL (ref 6–20)
CO2: 25 mmol/L (ref 22–32)
Calcium: 9.1 mg/dL (ref 8.9–10.3)
Chloride: 106 mmol/L (ref 98–111)
Creatinine, Ser: 0.97 mg/dL (ref 0.44–1.00)
GFR, Estimated: >60 mL/min (ref >60)
Glucose, Bld: 81 mg/dL (ref 70–99)
Potassium: 4.4 mmol/L (ref 3.5–5.1)
Sodium: 140 mmol/L (ref 135–145)
Total Bilirubin: 0.5 mg/dL (ref 0.0–1.2)
Total Protein: 7 g/dL (ref 6.5–8.1)

## 2023-08-21 LAB — POCT URINE DRUG SCREEN - MANUAL ENTRY (I-SCREEN)
POC Amphetamine UR: NOT DETECTED
POC Buprenorphine (BUP): NOT DETECTED
POC Cocaine UR: NOT DETECTED
POC Marijuana UR: NOT DETECTED
POC Methadone UR: NOT DETECTED
POC Methamphetamine UR: NOT DETECTED
POC Morphine: NOT DETECTED
POC Oxazepam (BZO): NOT DETECTED
POC Oxycodone UR: NOT DETECTED
POC Secobarbital (BAR): NOT DETECTED

## 2023-08-21 LAB — LIPID PANEL
Cholesterol: 178 mg/dL (ref 0–200)
HDL: 78 mg/dL (ref >40)
LDL Cholesterol: 93 mg/dL (ref 0–99)
Total CHOL/HDL Ratio: 2.3 ratio
Triglycerides: 37 mg/dL (ref <150)
VLDL: 7 mg/dL (ref 0–40)

## 2023-08-21 LAB — HEMOGLOBIN A1C
Hgb A1c MFr Bld: 5 % (ref 4.8–5.6)
Mean Plasma Glucose: 96.8 mg/dL

## 2023-08-21 LAB — TSH: TSH: 1.988 u[IU]/mL (ref 0.350–4.500)

## 2023-08-21 LAB — CBC WITH DIFFERENTIAL/PLATELET
Abs Immature Granulocytes: 0.02 10*3/uL (ref 0.00–0.07)
Basophils Absolute: 0.1 10*3/uL (ref 0.0–0.1)
Basophils Relative: 1 %
Eosinophils Absolute: 0.4 10*3/uL (ref 0.0–0.5)
Eosinophils Relative: 7 %
HCT: 38.8 % (ref 36.0–46.0)
Hemoglobin: 12.6 g/dL (ref 12.0–15.0)
Immature Granulocytes: 0 %
Lymphocytes Relative: 27 %
Lymphs Abs: 1.5 10*3/uL (ref 0.7–4.0)
MCH: 32.8 pg (ref 26.0–34.0)
MCHC: 32.5 g/dL (ref 30.0–36.0)
MCV: 101 fL — ABNORMAL HIGH (ref 80.0–100.0)
Monocytes Absolute: 0.6 10*3/uL (ref 0.1–1.0)
Monocytes Relative: 10 %
Neutro Abs: 3 10*3/uL (ref 1.7–7.7)
Neutrophils Relative %: 55 %
Platelets: 224 10*3/uL (ref 150–400)
RBC: 3.84 MIL/uL — ABNORMAL LOW (ref 3.87–5.11)
RDW: 14.1 % (ref 11.5–15.5)
WBC: 5.6 10*3/uL (ref 4.0–10.5)
nRBC: 0 % (ref 0.0–0.2)

## 2023-08-21 LAB — POC URINE PREG, ED: Preg Test, Ur: NEGATIVE

## 2023-08-21 LAB — ETHANOL: Alcohol, Ethyl (B): <15 mg/dL (ref <15)

## 2023-08-21 MED ORDER — PREGABALIN 25 MG PO CAPS
25.0000 mg | ORAL_CAPSULE | Freq: Three times a day (TID) | ORAL | Status: DC
  Administered 2023-08-21 – 2023-08-22 (×3): 25 mg via ORAL
  Filled 2023-08-21 (×3): qty 1

## 2023-08-21 MED ORDER — SULFASALAZINE 500 MG PO TBEC
500.0000 mg | DELAYED_RELEASE_TABLET | Freq: Two times a day (BID) | ORAL | Status: DC
  Filled 2023-08-21 (×2): qty 1

## 2023-08-21 MED ORDER — DIPHENHYDRAMINE HCL 50 MG/ML IJ SOLN: 50.0000 mg | Freq: Three times a day (TID) | INTRAMUSCULAR | Status: DC | PRN

## 2023-08-21 MED ORDER — TRAZODONE HCL 50 MG PO TABS
50.0000 mg | ORAL_TABLET | Freq: Every evening | ORAL | Status: DC | PRN
  Administered 2023-08-21: 50 mg via ORAL
  Filled 2023-08-21: qty 1

## 2023-08-21 MED ORDER — HALOPERIDOL LACTATE 5 MG/ML IJ SOLN: 10.0000 mg | Freq: Three times a day (TID) | INTRAMUSCULAR | Status: DC | PRN

## 2023-08-21 MED ORDER — SULFASALAZINE 500 MG PO TBEC
500.0000 mg | DELAYED_RELEASE_TABLET | Freq: Two times a day (BID) | ORAL | Status: DC
  Administered 2023-08-22: 500 mg via ORAL
  Filled 2023-08-21 (×3): qty 1

## 2023-08-21 MED ORDER — ACETAMINOPHEN 325 MG PO TABS
650.0000 mg | ORAL_TABLET | Freq: Four times a day (QID) | ORAL | Status: DC | PRN
  Administered 2023-08-21: 650 mg via ORAL
  Filled 2023-08-21: qty 2

## 2023-08-21 MED ORDER — LORATADINE 10 MG PO TABS
10.0000 mg | ORAL_TABLET | Freq: Every day | ORAL | Status: DC
  Administered 2023-08-22: 10 mg via ORAL
  Filled 2023-08-21: qty 1

## 2023-08-21 MED ORDER — MAGNESIUM HYDROXIDE 400 MG/5ML PO SUSP: 30.0000 mL | Freq: Every day | ORAL | Status: DC | PRN

## 2023-08-21 MED ORDER — HALOPERIDOL 5 MG PO TABS: 5.0000 mg | ORAL_TABLET | Freq: Three times a day (TID) | ORAL | Status: DC | PRN

## 2023-08-21 MED ORDER — IBUPROFEN 600 MG PO TABS
600.0000 mg | ORAL_TABLET | Freq: Four times a day (QID) | ORAL | Status: DC | PRN
  Administered 2023-08-22 (×2): 600 mg via ORAL
  Filled 2023-08-21 (×2): qty 1

## 2023-08-21 MED ORDER — HYDROXYZINE HCL 25 MG PO TABS: 25.0000 mg | ORAL_TABLET | Freq: Three times a day (TID) | ORAL | Status: DC | PRN

## 2023-08-21 MED ORDER — LORAZEPAM 2 MG/ML IJ SOLN: 2.0000 mg | Freq: Three times a day (TID) | INTRAMUSCULAR | Status: DC | PRN

## 2023-08-21 MED ORDER — HYDROCORTISONE 1 % EX CREA: TOPICAL_CREAM | Freq: Two times a day (BID) | CUTANEOUS | Status: DC | PRN

## 2023-08-21 MED ORDER — CELECOXIB 100 MG PO CAPS
200.0000 mg | ORAL_CAPSULE | Freq: Two times a day (BID) | ORAL | Status: DC
  Administered 2023-08-21 – 2023-08-22 (×2): 200 mg via ORAL
  Filled 2023-08-21 (×2): qty 2

## 2023-08-21 MED ORDER — HALOPERIDOL LACTATE 5 MG/ML IJ SOLN: 5.0000 mg | Freq: Three times a day (TID) | INTRAMUSCULAR | Status: DC | PRN

## 2023-08-21 MED ORDER — DIPHENHYDRAMINE HCL 50 MG PO CAPS: 50.0000 mg | ORAL_CAPSULE | Freq: Three times a day (TID) | ORAL | Status: DC | PRN

## 2023-08-21 MED ORDER — ALUM & MAG HYDROXIDE-SIMETH 200-200-20 MG/5ML PO SUSP: 30.0000 mL | ORAL | Status: DC | PRN
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
## 2023-08-21 NOTE — ED Notes (Signed)
 Patient observed/assessed in bed/chair resting quietly appearing in no distress and verbalizing no complaints at this time. Will continue to monitor.
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
## 2023-08-21 NOTE — ED Notes (Signed)
 Patient observed/assessed at bedside. Patient alert and oriented x 4. Affect is flat.  Patient denies pain and anxiety. He denies A/V/H. they deny having any thoughts/plan of self harm and harm towards others. Fluid and snack offered. Patient states that appetite has been good throughout the day.  Verbalizes no further complaints at this time. Will continue to monitor and support.

## 2023-08-21 NOTE — Progress Notes (Signed)
 Pt is admitted to [facility] due to delusions and paranoia. During assessment pt said she is here to sleep. Pt is alert and oriented. Pt is ambulatory and is oriented to staff/unit. Pt was cooperative with labs and skin assessment. Pt denies current SI/HI/AVH, plan or intent. Staff will monitor for pt's safety.
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
## 2023-08-22 NOTE — ED Notes (Signed)
 Patient observed resting quietly in her lounger, eyes closed. Respirations equal and unlabored. Will continue to monitor for safety.

## 2023-08-22 NOTE — ED Provider Notes (Signed)
 FBC/OBS ASAP Discharge Summary  Date and Time: 08/22/2023 3:09 PM  Name: Anne Brewer  MRN:  [id number]   Discharge Diagnoses:  Final diagnoses:  Psychosis, unspecified psychosis type [facility])    Subjective: Patient seen and evaluated face-to-face by this provider, chart reviewed and case discussed with Dr. Docia Brewer. On evaluation, Patient is alert and oriented x 4. Her thought process is linear with paranoia and delusional thought content which appears to be chronic in nature.  Patient's speech is coherent at a normal rate. Patient's mood is euthymic and affect congruent. Patient denies suicidal ideations. She denies homicidal ideations. She denies auditory or visual hallucinations.There is no objective evidence that the patient is currently responding to internal or external stimuli. She is calm and cooperative and does not appear to be in acute distress. Patient reports that she woke up around 530 this morning as she do every morning and showered and ate breakfast. She states that she has done with [facility] to do by coming here to get an assessment and proving him wrong. She states that nothing is wrong with her mentally and that she is ready to leave. She reports that the woman has been harassing her for years through a third party through [employer] and that is something that she had to get through. She denies depressive, anxiety or manic symptoms.  She reports good sleep. She denies poor appetite. She denies physical complaints at this time.  atient gives verbal consent for this provider to speak with her son Anne Brewer who is 68 years old. Patient states that nothing is wrong with her mentally but she is willing to follow up here at the [facility] for psychiatry and counseling to address stressors and mental health concerns. Patient verbalizes returning back to the [employer].   Per Anne Brewer, care manager, "Writer  spoke to Anne Brewer at the [facility] and confirmed that the patient is allowed to return to their facility."   I spoke to Anne Brewer via telephone [phone]. Anne Brewer states that his mother has no psychiatric history and to his knowledge she has not been psychiatrically hospitalized.  He reports that he saw his mother in person 2 months ago and talked to her this morning and she sounded okay. He states that sometimes she gets mad about stuff  but they talk through it and she calms down. He states that she has been stressed due to the death of her father 5 years ago, family issues, and trying to get back into school at [employer]. He states that nothing is wrong with his mother mentally and denies any safety concerns with her discharging today and [facility].   Stay Summary: Per my initial assessment on 4/25: Anne Brewer is a 53 year old female patient who denies a past psychiatric history other than previously told that she was paranoid schizophrenic and had PTSD 2 to 3 years ago while she was in [place]  due to similar presentation. Patient was brought to the [facility] voluntarily on 4/25 by the Behavioral Health Response Team [facility]) [facility] due to concerns for the patient's mental health. Per the Behavioral Health Response Team, the patient appeared to be a little paranoid about someone stalking her.  On evaluation, patient was alert and oriented to person, place, time and current situation (season/president). Her thought process was coherent. However, she  presented with loose association, scattered thoughts flight of ideas and delusional and paranoia thought content. Her speech was pressured and rapid. Her mood was euthymic and affect congruent. She was calm and cooperative. Patient reported that she was sent here for a mental health assessment. She appeared to be fixated on a man who she believes  is bisexual and homosexual and a woman stalking her. She repeatedly reported that she is not apart of the LGBTQ community. She went goes off on a tangent about a woman that has been stalking her since 09/09/17 while in [place] . She stated that the woman stalking her uses a third party to stalk her behind the scenes. She reports that the alleged stalker lives between [place]  or [place] . 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 She ruminated about the woman who is stalking her and stated that the woman is obsessed with her and is part of the LGBT community and is into threesomes and orgies. Patient reported that [facility], [facility]23. She reported that it is a 90-day to 59-month program. She reported that she lost her ID and that she has been asking for an extension at the [facility] to allow her time to get everything in order. However, the manager has been giving her a hard time. She reported that she plans to go to [employer] in August for college to major in criminal justice or become a Runner, broadcasting/film/video. On evaluation, patient denied suicidal ideations. She denied self-injurious behaviors. She denied homicidal ideations. She denied auditory or visual hallucinations. There was no objective evidence that the patient is currently responding to internal or external stimuli. However, she did appear to be experiencing paranoia and delusional thought content on exam. She denied depressive symptoms. She reported fair sleep. She reported a good appetite. She denied drinking alcohol or using illicit drugs. She denied outpatient psychiatry or counseling. She denied taking psychotropic medication and is only prescribed medical medications for arthritis.   Patient brought in a bag of medications: Patient is prescribed sulfasalazine  EC 500 mg DR tablets-take 1 tablet (500  mg) by mouth twice daily, pregablin 25 mg capsule-take 1 capsule (25 mg total) by mouth 3 times daily, fexfenadine hydrochoride (allergy relief) 180 mg po daily, multivitamin 100 tablet daily, celecoxib  200 mg capsule take one capule (200 mg) twice daily, methotrexate  2.5 mg tablet (take 10 tablets by mouth once a week), (OTC) hydrocortisone  1 % and (OTC) ibuprofen  200 mg po PRN.   Patient was admitted to the [facility] Continuous assessment unit for overnight observation for monitoring and further evaluation. Patient home medications were restarted as prescribed. Labs were obtained and unremarkable. UDS negative. BAL negative. Patient was observed overnight without any disruptive, aggressive, psychotic, or self harm behaviors.   On 4/25: Patient is alert and oriented x 4. Her thought process is linear with paranoia and delusional thought content which appears to be chronic in nature.  Patient's speech is coherent at a normal rate. Patient's mood is euthymic and affect congruent. Patient denies suicidal ideations. She denies homicidal ideations. She denies auditory or visual hallucinations.There is no objective evidence that the patient is currently responding to internal or external stimuli. She is calm and cooperative and does not appear to be in acute distress. Patient reports that she woke up around 530  this morning as she do every morning and showered and ate breakfast. She states that she has done with [facility] her to do by coming here to get an assessment and proving him wrong. She states that nothing is wrong with her mentally and that she is ready to leave. She reports that the woman has been harassing her for years through a third party through [employer] and that is something that she had to get through. She denies depressive, anxiety or manic symptoms.  She reports good sleep. She denies poor appetite. She denies physical complaints  at this time.  atient gives verbal consent for this provider to speak with her son Anne Brewer who is 14 years old.  I spoke to [facility] via telephone [phone]. Anne Brewer states that his mother has no psychiatric history and to his knowledge she has not been psychiatrically hospitalized.  He reports that he saw his mother in person 2 months ago and talked to her this morning and she sounded okay. He states that sometimes she gets mad about stuff  but they talk through it and she calms down. He states that she has been stressed due to the death of her father 5 years ago, family issues, and trying to get back into school at [employer]. He states that nothing is wrong with his mother mentally and denies any safety concerns with her discharging today and [facility].   Total Time spent with patient: 30 minutes  Past Psychiatric History: Patient denies a past psychiatric history. However, she reports that they tried to say that she was paranoid schizophrenic and that she had PTSD when she was seen in [place]  and had a mental health evaluation similar to this 2 to 3 years ago and was hospitalized.    Past Medical History: History of arthritis and injury to right hip.   Family History: No history reported.    Social History: [facility]. Patient receives disability. Patient is unemployed. Patient reports that she has 4 children, 2 daughters and 2 sons who live between [place]  and [place] .  Tobacco Cessation:  N/A, patient does not currently use tobacco products  Current Medications:  Current Facility-Administered Medications  Medication Dose Route Frequency Provider Last Rate Last Admin   acetaminophen  (TYLENOL ) tablet 650 mg  650 mg Oral Q6H PRN Patryck Kilgore L, NP   650 mg at 08/21/23 2112   alum & mag hydroxide-simeth (MAALOX/MYLANTA) 200-200-20 MG/5ML suspension 30 mL  30 mL Oral Q4H PRN Cornell Gaber L, NP        celecoxib  (CELEBREX ) capsule 200 mg  200 mg Oral BID Xzavier Swinger L, NP   200 mg at 08/22/23 0825   haloperidol  (HALDOL ) tablet 5 mg  5 mg Oral TID PRN Aanyah Loa L, NP       And   diphenhydrAMINE  (BENADRYL ) capsule 50 mg  50 mg Oral TID PRN Kaida Games L, NP       haloperidol  lactate (HALDOL ) injection 5 mg  5 mg Intramuscular TID PRN Haidynn Almendarez L, NP       And   diphenhydrAMINE  (BENADRYL ) injection 50 mg  50 mg Intramuscular TID PRN Anyssa Sharpless L, NP       And   LORazepam  (ATIVAN ) injection 2 mg  2 mg Intramuscular TID PRN Adilene Areola L, NP       haloperidol  lactate (HALDOL )  injection 10 mg  10 mg Intramuscular TID PRN Yesenia Locurto L, NP       And   diphenhydrAMINE  (BENADRYL ) injection 50 mg  50 mg Intramuscular TID PRN Summit Arroyave L, NP       And   LORazepam  (ATIVAN ) injection 2 mg  2 mg Intramuscular TID PRN Kedric Bumgarner L, NP       hydrocortisone  cream 1 %   Topical BID PRN Devlin Mcveigh L, NP       hydrOXYzine  (ATARAX ) tablet 25 mg  25 mg Oral TID PRN Smt Lokey L, NP       ibuprofen  (ADVIL ) tablet 600 mg  600 mg Oral Q6H PRN Kecia Swoboda L, NP   600 mg at 08/22/23 1455   loratadine  (CLARITIN ) tablet 10 mg  10 mg Oral Daily Macoy Rodwell L, NP   10 mg at 08/22/23 1610   magnesium  hydroxide (MILK OF MAGNESIA) suspension 30 mL  30 mL Oral Daily PRN Cheikh Bramble L, NP       pregabalin  (LYRICA ) capsule 25 mg  25 mg Oral TID Lerone Onder L, NP   25 mg at 08/22/23 1456   sulfaSALAzine  (AZULFIDINE ) EC tablet 500 mg  500 mg Oral BID Brandilynn Taormina L, NP   500 mg at 08/22/23 1452   traZODone  (DESYREL ) tablet 50 mg  50 mg Oral QHS PRN Ephriam Turman L, NP   50 mg at 08/21/23 2113   Current Outpatient Medications  Medication Sig Dispense Refill   celecoxib  (CELEBREX ) 200 MG capsule Take 1 capsule (200 mg total) by mouth 2 (two) times daily. 60 capsule 2   fexofenadine (ALLERGY RELIEF) 180 MG tablet Take 180 mg by mouth daily.     methotrexate   (RHEUMATREX) 2.5 MG tablet Take 10 tablets (25 mg total) by mouth once a week. Caution:Chemotherapy. Protect from light. (Patient taking differently: Take 25 mg by mouth every Friday. Caution:Chemotherapy. Protect from light.) 40 tablet 2   nystatin  (MYCOSTATIN /NYSTOP ) powder Apply 1 Application topically 3 (three) times daily. Apply under breasts as prescribed. (Patient taking differently: Apply 1 Application topically 3 (three) times daily as needed (Apply under breasts for yeast).) 15 g 0   pregabalin  (LYRICA ) 25 MG capsule Take 1 capsule (25 mg total) by mouth 3 (three) times daily. 90 capsule 0   sulfaSALAzine  (AZULFIDINE ) 500 MG EC tablet Take 1 tablet (500 mg total) by mouth 2 (two) times daily. 60 tablet 0   Calcium Carb-Cholecalciferol (CALCIUM + D3 PO) Take 1 tablet by mouth 2 (two) times daily. (Patient not taking: Reported on 08/22/2023)     ferrous sulfate  324 (65 Fe) MG TBEC One tablet bid for iron poor blood. (Patient not taking: Reported on 08/22/2023) 60 tablet 0   hydrocortisone  cream 1 % Apply 1 Application topically 2 (two) times daily. Apply to affected area (Patient not taking: Reported on 08/22/2023)     Multiple Vitamin (MULTIVITAMIN WITH MINERALS) TABS tablet Take 1 tablet by mouth daily. (Patient not taking: Reported on 08/22/2023)      PTA Medications:  Facility Ordered Medications  Medication   acetaminophen  (TYLENOL ) tablet 650 mg   alum & mag hydroxide-simeth (MAALOX/MYLANTA) 200-200-20 MG/5ML suspension 30 mL   magnesium  hydroxide (MILK OF MAGNESIA) suspension 30 mL   haloperidol  (HALDOL ) tablet 5 mg   And   diphenhydrAMINE  (BENADRYL ) capsule 50 mg   haloperidol  lactate (HALDOL ) injection 5 mg   And   diphenhydrAMINE  (BENADRYL ) injection 50 mg   And   LORazepam  (ATIVAN )  injection 2 mg   haloperidol  lactate (HALDOL ) injection 10 mg   And   diphenhydrAMINE  (BENADRYL ) injection 50 mg   And   LORazepam  (ATIVAN ) injection 2 mg   hydrOXYzine  (ATARAX ) tablet 25 mg    traZODone  (DESYREL ) tablet 50 mg   celecoxib  (CELEBREX ) capsule 200 mg   pregabalin  (LYRICA ) capsule 25 mg   ibuprofen  (ADVIL ) tablet 600 mg   loratadine  (CLARITIN ) tablet 10 mg   sulfaSALAzine  (AZULFIDINE ) EC tablet 500 mg   hydrocortisone  cream 1 %   PTA Medications  Medication Sig   nystatin  (MYCOSTATIN /NYSTOP ) powder Apply 1 Application topically 3 (three) times daily. Apply under breasts as prescribed. (Patient taking differently: Apply 1 Application topically 3 (three) times daily as needed (Apply under breasts for yeast).)   methotrexate  (RHEUMATREX) 2.5 MG tablet Take 10 tablets (25 mg total) by mouth once a week. Caution:Chemotherapy. Protect from light. (Patient taking differently: Take 25 mg by mouth every Friday. Caution:Chemotherapy. Protect from light.)   celecoxib  (CELEBREX ) 200 MG capsule Take 1 capsule (200 mg total) by mouth 2 (two) times daily.   sulfaSALAzine  (AZULFIDINE ) 500 MG EC tablet Take 1 tablet (500 mg total) by mouth 2 (two) times daily.   pregabalin  (LYRICA ) 25 MG capsule Take 1 capsule (25 mg total) by mouth 3 (three) times daily.   fexofenadine (ALLERGY RELIEF) 180 MG tablet Take 180 mg by mouth daily.   Calcium Carb-Cholecalciferol (CALCIUM + D3 PO) Take 1 tablet by mouth 2 (two) times daily. (Patient not taking: Reported on 08/22/2023)   ferrous sulfate  324 (65 Fe) MG TBEC One tablet bid for iron poor blood. (Patient not taking: Reported on 08/22/2023)   Multiple Vitamin (MULTIVITAMIN WITH MINERALS) TABS tablet Take 1 tablet by mouth daily. (Patient not taking: Reported on 08/22/2023)   hydrocortisone  cream 1 % Apply 1 Application topically 2 (two) times daily. Apply to affected area (Patient not taking: Reported on 08/22/2023)       08/05/2023    1:38 PM 07/02/2023    1:29 PM 07/11/2016    7:58 AM  Depression screen PHQ 2/9  Decreased Interest 0 0 0  Down, Depressed, Hopeless 0 0 0  PHQ - 2 Score 0 0 0  Altered sleeping   0  Tired, decreased energy   0   Change in appetite   0  Feeling bad or failure about yourself    0  Trouble concentrating   0  Moving slowly or fidgety/restless   0  Suicidal thoughts   0  PHQ-9 Score   0    Flowsheet Row ED from 08/21/2023 in [facility] ED from [facility]/13/2023 in [facility] Emergency Department at [facility]  C-SSRS RISK CATEGORY No Risk No Risk       Musculoskeletal  Strength & Muscle Tone: within normal limits Gait & Station: normal Patient leans: N/A  Psychiatric Specialty Exam  Presentation  General Appearance:  Appropriate for Environment  Eye Contact: Fair  Speech: Clear and Coherent  Speech Volume: Normal  Handedness: Right   Mood and Affect  Mood: Euthymic  Affect: Congruent   Thought Process  Thought Processes: Coherent  Descriptions of Associations:Loose  Orientation:Full (Time, Place and Person)  Thought Content:Paranoid Ideation  Diagnosis of Schizophrenia or Schizoaffective disorder in past: No  Duration of Psychotic Symptoms: Less than six months   Hallucinations:Hallucinations: None  Ideas of Reference:Paranoia  Suicidal Thoughts:Suicidal Thoughts: No  Homicidal Thoughts:Homicidal Thoughts: No   Sensorium  Memory: Immediate Fair; Recent  Fair; Remote Fair  Judgment: Intact  Insight: Present   Executive Functions  Concentration: Fair  Attention Span: Fair  Recall: [employer] of Knowledge: Fair  Language: Fair   Psychomotor Activity  Psychomotor Activity: Psychomotor Activity: Normal   Assets  Assets: Communication Skills; Desire for Improvement; Housing; Health and safety inspector; Physical Health; Leisure Time; Social Support   Sleep  Sleep: Sleep: Fair Number of Hours of Sleep: 6   Nutritional Assessment (For OBS and FBC admissions only) Has the patient had a weight loss or gain of 10 pounds or more in the last 3 months?: No Has the patient had a decrease in food  intake/or appetite?: No Does the patient have dental problems?: No Does the patient have eating habits or behaviors that may be indicators of an eating disorder including binging or inducing vomiting?: No Has the patient recently lost weight without trying?: 0 Has the patient been eating poorly because of a decreased appetite?: 0 Malnutrition Screening Tool Score: 0    Physical Exam  Physical Exam HENT:     Nose: Nose normal.  Cardiovascular:     Rate and Rhythm: Normal rate.  Pulmonary:     Effort: Pulmonary effort is normal.  Musculoskeletal:        General: Normal range of motion.     Cervical back: Normal range of motion.  Neurological:     Mental Status: She is alert and oriented to person, place, and time.    Review of Systems  Constitutional: Negative.   HENT: Negative.    Eyes: Negative.   Respiratory: Negative.    Cardiovascular: Negative.   Gastrointestinal: Negative.   Genitourinary: Negative.   Musculoskeletal:        Left hip pain  Neurological: Negative.   Endo/Heme/Allergies: Negative.    Blood pressure 120/88, pulse 86, temperature 100 F (37.8 C), temperature source Oral, resp. rate 18, last menstrual period 10/06/2016, SpO2 97%. There is no height or weight on file to calculate BMI.  Demographic Factors:  Low socioeconomic status and Unemployed  Loss Factors: Financial problems/change in socioeconomic status  Historical Factors: NA  Risk Reduction Factors:   Sense of responsibility to family, Religious beliefs about death, and Positive coping skills or problem solving skills  Continued Clinical Symptoms:  Previous Psychiatric Diagnoses and Treatments  Cognitive Features That Contribute To Risk:  None    Suicide Risk:  Minimal: No identifiable suicidal ideation.  Patients presenting with no risk factors but with morbid ruminations; may be classified as minimal risk based on the severity of the depressive symptoms  Plan Of Care/Follow-up  recommendations:  Discharge recommendations:   Medications: Patient is to take medications as prescribed. No medication changes were made during your stay. The patient or patient's guardian is to contact a medical professional and/or outpatient provider to address any new side effects that develop. The patient or the patient's guardian should update outpatient providers of any new medications and/or medication changes.   Outpatient Follow up: Please review list of outpatient resources for psychiatry and counseling. Please follow up with your primary care provider for all medical related needs.   You are encouraged to follow up with [facility] for outpatient treatment.  Walk in/ Open Access Hours: Monday - Friday 8AM - 11AM   [facility] [address] [place], [place] [phone]  Therapy: We recommend that patient participate in individual therapy to address mental health concerns.  Safety:   The following safety precautions should be taken:  No sharp objects. This includes scissors, razors, scrapers, and putty knives.   Chemicals should be removed and locked up.   Medications should be removed and locked up.   Weapons should be removed and locked up. This includes firearms, knives and instruments that can be used to cause injury.   The patient should abstain from use of illicit substances/drugs and abuse of any medications.  If symptoms worsen or do not continue to improve or if the patient becomes actively suicidal or homicidal then it is recommended that the patient return to the closest hospital emergency department, the [facility], or call 911 for further evaluation and treatment. National Suicide Prevention Lifeline 1-800-SUICIDE or 610-306-6834.  About 988 988 offers 24/7 access to trained crisis counselors who can help people experiencing mental health-related distress. People can call or text 988  or chat 988lifeline.org for themselves or if they are worried about a loved one who may need crisis support.   Disposition: Discharge. Patient returning back to the [employer]. Although, patient appears to be experiencing paranoia and delusional thought content it appears to be chronic in nature. There is no evidence of acute psychosis or imminent risk to self or others. Patient does not meet [place]  criteria for involuntary commitment.  Patient could benefit from following up with outpatient psychiatry and counseling to address mental health concerns.  Kirklin Mcduffee L, NP 08/22/2023, 3:09 PM

## 2023-08-22 NOTE — Care Management (Signed)
 OBS Care Management   Writer spoke to [place] at the [facility] and confirmed that the patient is allowed to return to their facility.   Writer informed the MD, Dr. Docia Freeman and the NP, Adrianne Albert.

## 2023-08-22 NOTE — ED Notes (Signed)
 Patient observed/assessed in bed/chair resting quietly appearing in no distress and verbalizing no complaints at this time. Will continue to monitor.

## 2023-08-22 NOTE — Discharge Instructions (Addendum)
 Discharge recommendations:   Medications: Patient is to take medications as prescribed. No medication changes were made during your stay. The patient or patient's guardian is to contact a medical professional and/or outpatient provider to address any new side effects that develop. The patient or the patient's guardian should update outpatient providers of any new medications and/or medication changes.   Outpatient Follow up: Please review list of outpatient resources for psychiatry and counseling. Please follow up with your primary care provider for all medical related needs.   You are encouraged to follow up with [facility] for outpatient treatment.  Walk in/ Open Access Hours: Monday - Friday 8AM - 11AM   [facility] [address] [place], [place] [phone]  Therapy: We recommend that patient participate in individual therapy to address mental health concerns.  Safety:   The following safety precautions should be taken:   No sharp objects. This includes scissors, razors, scrapers, and putty knives.   Chemicals should be removed and locked up.   Medications should be removed and locked up.   Weapons should be removed and locked up. This includes firearms, knives and instruments that can be used to cause injury.   The patient should abstain from use of illicit substances/drugs and abuse of any medications.  If symptoms worsen or do not continue to improve or if the patient becomes actively suicidal or homicidal then it is recommended that the patient return to the closest hospital emergency department[facility], or call 911 for further evaluation and treatment. National Suicide Prevention Lifeline 1-800-SUICIDE or 307-556-6497.  About 988 988 offers 24/7 access to trained crisis counselors who can help people experiencing mental health-related distress. People can call or text 988 or chat  988lifeline.org for themselves or if they are worried about a loved one who may need crisis support.

## 2023-08-22 NOTE — Progress Notes (Signed)
 Patient seen in [facility] medical clinic complaining of dry skin and skin irritation of the foot and right forearm with rash on forearm  Patient still thinks someone is sticking needles in her at night and scraping her feet at night with delusional ideation  She is quite stressed over being in the shelter looks forward to exiting  She has all her other medications  I gave the patient foot cream and skin moisturizer along with multivitamins and hydrocortisone  cream for the right forearm  There is no evidence of skin infection there is no evidence of any needling that has occurred

## 2023-08-22 NOTE — ED Notes (Signed)
 Patient awake. Sitting up in the chair drinking coffee. She is A & O X 4. Denies SI/HI and AVH.

## 2023-08-22 NOTE — ED Notes (Signed)
 Patient discharged home in no acute distress. Pt's belongings returned from locker #21. Taxi voucher was provided.

## 2023-08-26 NOTE — Congregational Nurse Program (Signed)
  Dept: [phone]   Congregational Nurse Program Note  Date of Encounter: 08/26/2023  Resident clinic visit for complaint of left knee pain, also unsure of initial PCP visit date and time.  Resident's appointment changed to [facility] due to closer location.  Confirmed appointment time with PCP office for her for May 6th. Also, rollator walker has damaged tire, message left for [employer] MD regarding replacement. Past Medical History: Past Medical History:  Diagnosis Date   Anemia    Arthritis     Encounter Details:  Community Questionnaire - 08/26/23 1150       Questionnaire   Ask client: Do you give verbal consent for me to treat you today? Yes    Student Assistance N/A    Location Patient Served  [facility]    Encounter Setting CN site    Population Status Unhoused    Insurance Medicare    Insurance/Financial Assistance Referral N/A    Medication N/A    Medical Provider Yes    Screening Referrals Made N/A    Medical Referrals Made [facility]    Medical Appointment Completed N/A    CNP Interventions Advocate/Support;Navigate Healthcare System;Counsel    Screenings CN Performed Blood Pressure    ED Visit Averted N/A    Life-Saving Intervention Made N/A

## 2023-08-27 ENCOUNTER — Other Ambulatory Visit: Payer: Self-pay

## 2023-08-27 ENCOUNTER — Encounter: Payer: Self-pay | Admitting: Physician Assistant

## 2023-08-27 ENCOUNTER — Other Ambulatory Visit: Payer: Self-pay | Admitting: Physician Assistant

## 2023-08-27 ENCOUNTER — Other Ambulatory Visit: Payer: Self-pay | Admitting: Critical Care Medicine

## 2023-08-27 ENCOUNTER — Encounter: Payer: Self-pay | Admitting: *Deleted

## 2023-08-27 DIAGNOSIS — M059 Rheumatoid arthritis with rheumatoid factor, unspecified: Secondary | ICD-10-CM

## 2023-08-27 MED ORDER — AZITHROMYCIN 250 MG PO TABS
ORAL_TABLET | ORAL | 0 refills | Status: AC
  Filled 2023-08-27: qty 6, 5d supply, fill #0

## 2023-08-27 NOTE — Progress Notes (Signed)
Rheumatology referral

## 2023-08-27 NOTE — Congregational Nurse Program (Signed)
  Dept: [phone]   Congregational Nurse Program Note  Date of Encounter: 08/27/2023  Past Medical History: Past Medical History:  Diagnosis Date   Anemia    Arthritis     Encounter Details:  Community Questionnaire - 08/27/23 1234       Questionnaire   Ask client: Do you give verbal consent for me to treat you today? Yes    Student Assistance N/A    Location Patient Served  [facility]    Encounter Setting CN site;Phone/Text/Email    Population Status Animal nutritionist    Insurance/Financial Assistance Referral N/A    Medication N/A    Medical Provider Yes    Screening Referrals Made N/A    Medical Referrals Made [facility]    Medical Appointment Completed N/A    CNP Interventions Advocate/Support;Navigate Healthcare System;Case Management    Screenings CN Performed Blood Pressure    ED Visit Averted N/A    Life-Saving Intervention Made N/A            Client signed sheet to see MD in [facility] clinic. Completed triage form. Client requested assistance with finding out if new insurance starting May 1st would cover upcoming doctor visits and PCP. Contacted [facility] and PCP is covered. Assisted client with appt to [facility] My [facility] 3:[address] [place]  [place]. Contacted [facility] and they have not received any information from client's prior Rheumatologist seen in [place] in January. They require this information before establishing client. Client to discuss in [facility] appt today. Client requesting assistance with pain lt heel, lt knee and lt hip. She c/o productive cough with light green sputum. She is requesting iron pills, She says she is having problems with her walker and reports fluid on BLE. She would like these problems addressed in [facility] visit today with MD. Modesto Andreas RN CN

## 2023-08-27 NOTE — Progress Notes (Signed)
 See note

## 2023-08-27 NOTE — Progress Notes (Signed)
 Pt rollator has a bad wheel, sig loss of tread. Pictures taken to document the brand and model of rollator. We will order a wheel and put it on.  She c/o pain in back and feet. The pain is worse than previous. She is on the Lyrica , says it is not helping.  She has a cough, says is sometimes productive of green sputum. No fevers or chills. Has not taken ABX.   On Exam, she has some rales and rhonchi in the LLL.   Prev seen at the Rheumatology Clinic at [facility] in [place]. We were able to get the Records on Care Everywhere >> Dr Brent Cambric was able to print them at his office so they will be there when she is seen on 5/06.  The Rheumatology office was contacted, new pt appts are out to October, they will put her on a cancellation list.   BP is much improved but HR slightly elevated, increased water consumption encouraged.  There were no vitals filed for this visit.    08/27/2023   12:43 PM 08/26/2023   12:08 PM 08/22/2023    8:50 AM  Vitals with BMI  Systolic 125 122   Diastolic 78 79   Pulse 90 86      Information is confidential and restricted. Go to Review Flowsheets to unlock data.    Armandina Bernard, PA-C 08/27/2023 2:40 PM

## 2023-08-28 ENCOUNTER — Encounter: Payer: Self-pay | Admitting: *Deleted

## 2023-08-28 ENCOUNTER — Other Ambulatory Visit: Payer: Self-pay

## 2023-08-28 ENCOUNTER — Telehealth: Payer: Self-pay | Admitting: Family

## 2023-08-28 NOTE — Telephone Encounter (Signed)
 Copied from CRM [id number]. Topic: Referral - Question  >> Aug 28, 2023 10:39 AM Essie A wrote: Reason for CRM: Siri Duet from [facility] need the records from the previous rheumotologist.  They can faxed to [phone] to the attn of Omega.

## 2023-08-28 NOTE — Congregational Nurse Program (Signed)
  Dept: [phone]   Congregational Nurse Program Note  Date of Encounter: 08/28/2023  Past Medical History: Past Medical History:  Diagnosis Date   Anemia    Arthritis     Encounter Details:  Community Questionnaire - 08/28/23 1317       Questionnaire   Ask client: Do you give verbal consent for me to treat you today? Yes    Student Assistance N/A    Location Patient Served  [facility]    Encounter Setting CN site    Population Status Unhoused    Insurance Medicare    Insurance/Financial Assistance Referral N/A    Medication Have Medication Insecurities;Provided Medication Assistance    Medical Provider Yes    Screening Referrals Made N/A    Medical Referrals Made N/A    Medical Appointment Completed [facility]    CNP Interventions Advocate/Support;Case Management    Screenings CN Performed N/A    ED Visit Averted N/A    Life-Saving Intervention Made N/A            Medication azithromycin  picked up from [facility] and brought to client at [facility]. Client educated on side effects and acknowledges understanding.  Akram Kissick W RN CN

## 2023-08-29 NOTE — Telephone Encounter (Signed)
 Noted.

## 2023-09-01 ENCOUNTER — Emergency Department (HOSPITAL_COMMUNITY): Payer: Self-pay

## 2023-09-01 ENCOUNTER — Telehealth: Payer: Self-pay | Admitting: Emergency Medicine

## 2023-09-01 ENCOUNTER — Other Ambulatory Visit: Payer: Self-pay

## 2023-09-01 ENCOUNTER — Emergency Department (HOSPITAL_COMMUNITY)
Admission: EM | Admit: 2023-09-01 | Discharge: 2023-09-04 | Disposition: A | Discharge status: Other Acute Inpatient Hospital | Payer: Self-pay | Attending: Emergency Medicine | Admitting: Emergency Medicine

## 2023-09-01 DIAGNOSIS — I959 Hypotension, unspecified: Secondary | ICD-10-CM | POA: Diagnosis not present

## 2023-09-01 DIAGNOSIS — F39 Unspecified mood [affective] disorder: Secondary | ICD-10-CM | POA: Insufficient documentation

## 2023-09-01 DIAGNOSIS — R001 Bradycardia, unspecified: Secondary | ICD-10-CM | POA: Diagnosis not present

## 2023-09-01 DIAGNOSIS — F29 Unspecified psychosis not due to a substance or known physiological condition: Secondary | ICD-10-CM | POA: Diagnosis not present

## 2023-09-01 DIAGNOSIS — R4182 Altered mental status, unspecified: Secondary | ICD-10-CM | POA: Diagnosis not present

## 2023-09-01 DIAGNOSIS — R0902 Hypoxemia: Secondary | ICD-10-CM | POA: Diagnosis not present

## 2023-09-01 DIAGNOSIS — F22 Delusional disorders: Secondary | ICD-10-CM | POA: Diagnosis not present

## 2023-09-01 DIAGNOSIS — F1210 Cannabis abuse, uncomplicated: Secondary | ICD-10-CM | POA: Insufficient documentation

## 2023-09-01 DIAGNOSIS — R404 Transient alteration of awareness: Secondary | ICD-10-CM | POA: Diagnosis not present

## 2023-09-01 DIAGNOSIS — R531 Weakness: Secondary | ICD-10-CM | POA: Diagnosis not present

## 2023-09-01 DIAGNOSIS — R55 Syncope and collapse: Secondary | ICD-10-CM | POA: Diagnosis not present

## 2023-09-01 DIAGNOSIS — D72829 Elevated white blood cell count, unspecified: Secondary | ICD-10-CM | POA: Diagnosis not present

## 2023-09-01 DIAGNOSIS — R400 Somnolence: Secondary | ICD-10-CM | POA: Diagnosis present

## 2023-09-01 LAB — COMPREHENSIVE METABOLIC PANEL WITH GFR
ALT: 19 U/L (ref 0–44)
AST: 24 U/L (ref 15–41)
Albumin: 3.3 g/dL — ABNORMAL LOW (ref 3.5–5.0)
Alkaline Phosphatase: 93 U/L (ref 38–126)
Anion gap: 9 (ref 5–15)
BUN: 23 mg/dL — ABNORMAL HIGH (ref 6–20)
CO2: 22 mmol/L (ref 22–32)
Calcium: 8.6 mg/dL — ABNORMAL LOW (ref 8.9–10.3)
Chloride: 107 mmol/L (ref 98–111)
Creatinine, Ser: 0.91 mg/dL (ref 0.44–1.00)
GFR, Estimated: >60 mL/min (ref >60)
Glucose, Bld: 163 mg/dL — ABNORMAL HIGH (ref 70–99)
Potassium: 3.7 mmol/L (ref 3.5–5.1)
Sodium: 138 mmol/L (ref 135–145)
Total Bilirubin: 0.4 mg/dL (ref 0.0–1.2)
Total Protein: 6.7 g/dL (ref 6.5–8.1)

## 2023-09-01 LAB — CBC WITH DIFFERENTIAL/PLATELET
Abs Immature Granulocytes: 0.04 10*3/uL (ref 0.00–0.07)
Basophils Absolute: 0 10*3/uL (ref 0.0–0.1)
Basophils Relative: 0 %
Eosinophils Absolute: 0.1 10*3/uL (ref 0.0–0.5)
Eosinophils Relative: 1 %
HCT: 32 % — ABNORMAL LOW (ref 36.0–46.0)
Hemoglobin: 10.3 g/dL — ABNORMAL LOW (ref 12.0–15.0)
Immature Granulocytes: 0 %
Lymphocytes Relative: 11 %
Lymphs Abs: 1.2 10*3/uL (ref 0.7–4.0)
MCH: 32.8 pg (ref 26.0–34.0)
MCHC: 32.2 g/dL (ref 30.0–36.0)
MCV: 101.9 fL — ABNORMAL HIGH (ref 80.0–100.0)
Monocytes Absolute: 0.5 10*3/uL (ref 0.1–1.0)
Monocytes Relative: 4 %
Neutro Abs: 8.8 10*3/uL — ABNORMAL HIGH (ref 1.7–7.7)
Neutrophils Relative %: 84 %
Platelets: 215 10*3/uL (ref 150–400)
RBC: 3.14 MIL/uL — ABNORMAL LOW (ref 3.87–5.11)
RDW: 13.5 % (ref 11.5–15.5)
WBC: 10.6 10*3/uL — ABNORMAL HIGH (ref 4.0–10.5)
nRBC: 0 % (ref 0.0–0.2)

## 2023-09-01 LAB — BLOOD GAS, VENOUS
Acid-base deficit: 2.3 mmol/L — ABNORMAL HIGH (ref 0.0–2.0)
Bicarbonate: 24.9 mmol/L (ref 20.0–28.0)
O2 Saturation: 93.2 %
Patient temperature: 37
pCO2, Ven: 53 mmHg (ref 44–60)
pH, Ven: 7.28 (ref 7.25–7.43)
pO2, Ven: 62 mmHg — ABNORMAL HIGH (ref 32–45)

## 2023-09-01 LAB — AMMONIA: Ammonia: <13 umol/L (ref 9–35)

## 2023-09-01 LAB — ETHANOL: Alcohol, Ethyl (B): <15 mg/dL (ref <15)

## 2023-09-01 LAB — TSH: TSH: 1.942 u[IU]/mL (ref 0.350–4.500)

## 2023-09-01 LAB — LIPASE, BLOOD: Lipase: 33 U/L (ref 11–51)

## 2023-09-01 LAB — ACETAMINOPHEN LEVEL: Acetaminophen (Tylenol), Serum: <10 ug/mL — ABNORMAL LOW (ref 10–30)

## 2023-09-01 LAB — SALICYLATE LEVEL: Salicylate Lvl: <7 mg/dL — ABNORMAL LOW (ref 7.0–30.0)

## 2023-09-01 MED ORDER — SODIUM CHLORIDE 0.9 % IV BOLUS
500.0000 mL | Freq: Once | INTRAVENOUS | Status: AC
  Administered 2023-09-01: 500 mL via INTRAVENOUS

## 2023-09-01 NOTE — Telephone Encounter (Signed)
 Copied from CRM [id number]. Topic: Referral - Question >> Sep 01, 2023 10:23 AM Jayson Michael wrote: Reason for CRM: Siri Duet from [facility] [phone]) called regarding Referral ID [id number] rheumatology, the provider is requesting previous rheumatology records to be faxed to [phone] Attn: Omega, prior to seeing the patient .

## 2023-09-01 NOTE — ED Provider Notes (Signed)
 Emergency Department Provider Note   I have reviewed the triage vital signs and the nursing notes.   HISTORY  Chief Complaint chronic delusional thoughts   HPI Anne Brewer is a 53 y.o. female with PMH of psychosis and anemia arrives to the emergency department by EMS after being picked up at [facility].  She was found to be somnolent and in a wheelchair.  She awakens to voice and makes eye contact but will not speak or answer questions. No other known history at this time.   Level 5 caveat: AMS  Past Medical History:  Diagnosis Date   Anemia    Arthritis     Review of Systems  Level 5 caveat: AMS  ____________________________________________   PHYSICAL EXAM:  VITAL SIGNS: ED Triage Vitals [09/01/23 2035]  Encounter Vitals Group     BP (!) 153/45     Pulse Rate 84     Resp 16     Temp 97.6 F (36.4 C)     Temp Source Oral     SpO2 100 %   Constitutional: Somnolent but opens eyes to voice and light touch. Makes eye contact. Eyes: Conjunctivae are normal.  Head: Atraumatic. Nose: No congestion/rhinnorhea. Mouth/Throat: Mucous membranes are moist. Neck: No stridor.   Cardiovascular: Normal rate, regular rhythm. Good peripheral circulation. Grossly normal heart sounds.   Respiratory: Normal respiratory effort.  No retractions. Lungs CTAB. Gastrointestinal: Soft and nontender. No distention.  Musculoskeletal: No gross deformities of extremities. Neurologic: Opens eyes to voice and makes eye contact. Not speaking. No participating with exam.  Skin:  Skin is warm, dry and intact. No rash noted.   ____________________________________________   LABS (all labs ordered are listed, but only abnormal results are displayed)  Labs Reviewed  ACETAMINOPHEN  LEVEL - Abnormal; Notable for the following components:      Result Value   Acetaminophen  (Tylenol ), Serum <10 (*)    All other components within normal limits  COMPREHENSIVE METABOLIC PANEL WITH GFR  - Abnormal; Notable for the following components:   Glucose, Bld 163 (*)    BUN 23 (*)    Calcium 8.6 (*)    Albumin 3.3 (*)    All other components within normal limits  SALICYLATE LEVEL - Abnormal; Notable for the following components:   Salicylate Lvl <7.0 (*)    All other components within normal limits  CBC WITH DIFFERENTIAL/PLATELET - Abnormal; Notable for the following components:   WBC 10.6 (*)    RBC 3.14 (*)    Hemoglobin 10.3 (*)    HCT 32.0 (*)    MCV 101.9 (*)    Neutro Abs 8.8 (*)    All other components within normal limits  RAPID URINE DRUG SCREEN, HOSP PERFORMED - Abnormal; Notable for the following components:   Tetrahydrocannabinol POSITIVE (*)    All other components within normal limits  URINALYSIS, ROUTINE W REFLEX MICROSCOPIC - Abnormal; Notable for the following components:   Protein, ur 30 (*)    All other components within normal limits  BLOOD GAS, VENOUS - Abnormal; Notable for the following components:   pO2, Ven 62 (*)    Acid-base deficit 2.3 (*)    All other components within normal limits  SARS CORONAVIRUS 2 BY RT PCR  ETHANOL  LIPASE, BLOOD  AMMONIA  TSH   ____________________________________________  EKG   EKG Interpretation Date/Time:  Tuesday Sep 02 2023 08:18:45 EDT Ventricular Rate:  47 PR Interval:  145 QRS Duration:  104 QT Interval:  449 QTC Calculation: 397 R Axis:   43  Text Interpretation: Sinus bradycardia Otherwise within normal limits When compared with ECG of 09/01/2023, HEART RATE has decreased Confirmed by Alissa April ([id number]) on 09/03/2023 12:50:27 AM         ____________________________________________   PROCEDURES  Procedure(s) performed:   Procedures  CRITICAL CARE Performed by: Roberts Ching Total critical care time: 35 minutes Critical care time was exclusive of separately billable procedures and treating other patients. Critical care was necessary to treat or prevent imminent or life-threatening  deterioration. Critical care was time spent personally by me on the following activities: development of treatment plan with patient and/or surrogate as well as nursing, discussions with consultants, evaluation of patient's response to treatment, examination of patient, obtaining history from patient or surrogate, ordering and performing treatments and interventions, ordering and review of laboratory studies, ordering and review of radiographic studies, pulse oximetry and re-evaluation of patient's condition.  Abby Hocking, MD Emergency Medicine  ____________________________________________   INITIAL IMPRESSION / ASSESSMENT AND PLAN / ED COURSE  Pertinent labs & imaging results that were available during my care of the patient were reviewed by me and considered in my medical decision making (see chart for details).   This patient is Presenting for Evaluation of AMS, which does require a range of treatment options, and is a complaint that involves a high risk of morbidity and mortality.  The Differential Diagnoses includes but is not exclusive to alcohol, illicit or prescription medications, intracranial pathology such as stroke, intracerebral hemorrhage, fever or infectious causes including sepsis, hypoxemia, uremia, trauma, endocrine related disorders such as diabetes, hypoglycemia, thyroid -related diseases, etc.   Critical Interventions-    Medications  acetaminophen  (TYLENOL ) tablet 650 mg (has no administration in time range)  alum & mag hydroxide-simeth (MAALOX/MYLANTA) 200-200-20 MG/5ML suspension 30 mL (has no administration in time range)  ondansetron  (ZOFRAN ) tablet 4 mg (has no administration in time range)  risperiDONE (RISPERDAL) tablet 2 mg (2 mg Oral Given 09/02/23 2205)  ibuprofen  (ADVIL ) tablet 400 mg (400 mg Oral Given 09/03/23 1524)  pregabalin  (LYRICA ) capsule 25 mg (25 mg Oral Given by Other 09/03/23 1528)  sulfaSALAzine  (AZULFIDINE ) EC tablet 500 mg (500 mg Oral Given 09/03/23  1524)  sodium chloride  0.9 % bolus 500 mL (0 mLs Intravenous Stopped 09/01/23 2131)    Reassessment after intervention:  patient more calm with meds.    I did obtain Additional Historical Information from EMS, as the patient is somnolent.  I decided to review pertinent External Data, and in summary recent ED evaluation and psychiatry evaluation for psychosis, unknown type.   Clinical Laboratory Tests Ordered, included CBC with very mild leukocytosis to 10.6.  No AKI.  Tylenol , salicylate, alcohol negative.  Radiologic Tests Ordered, included CT head. I independently interpreted the images and agree with radiology interpretation.   Cardiac Monitor Tracing which shows NSR.   Social Determinants of Health Risk patient is a smoker.   Medical Decision Making: Summary:  Patient presents emergency department for evaluation of altered mental status.  She will wake with prompting and make eye contact but then goes back to sleep.  Differential is fairly broad at this time but also includes psychiatric etiologies.    Reevaluation with update and discussion with patient at 10:31 PM. Patient wakens to voice and now is speaking. She remains drowsy but asking for a paper towel to spit into.   Care transferred to Dr. Maralee Senate pending remaining labs and TTS evaluation.   Patient's  presentation is most consistent with acute presentation with potential threat to life or bodily function.   Disposition: pending  ____________________________________________  FINAL CLINICAL IMPRESSION(S) / ED DIAGNOSES  Final diagnoses:  Altered mental status, unspecified altered mental status type    Note:  This document was prepared using Dragon voice recognition software and may include unintentional dictation errors.  Abby Hocking, MD, [facility] Emergency Medicine    Bryann Gentz, Shereen Dike, MD 09/03/23 [phone]

## 2023-09-01 NOTE — Telephone Encounter (Signed)
 Attempt to call [facility] to advise them that  patient  should sign release form about the records needed from previous Rheumatology and them faxed to their office.  Office was closed will attempt to call back tomorrow.

## 2023-09-01 NOTE — ED Triage Notes (Signed)
 [facility] for AMS and hypotension, EMS found pt to be lethargic, laying over in wheelchair. Alert to voice, unable to answer questions.  PTA CBG 180 20G L AC 250cc NS bolus

## 2023-09-02 ENCOUNTER — Ambulatory Visit: Payer: Self-pay | Attending: Nurse Practitioner | Admitting: Nurse Practitioner

## 2023-09-02 ENCOUNTER — Telehealth: Payer: Self-pay | Admitting: Family

## 2023-09-02 DIAGNOSIS — F1210 Cannabis abuse, uncomplicated: Secondary | ICD-10-CM | POA: Diagnosis present

## 2023-09-02 DIAGNOSIS — F39 Unspecified mood [affective] disorder: Secondary | ICD-10-CM | POA: Diagnosis present

## 2023-09-02 LAB — URINALYSIS, ROUTINE W REFLEX MICROSCOPIC
Bacteria, UA: NONE SEEN
Bilirubin Urine: NEGATIVE
Glucose, UA: NEGATIVE mg/dL
Hgb urine dipstick: NEGATIVE
Ketones, ur: NEGATIVE mg/dL
Leukocytes,Ua: NEGATIVE
Nitrite: NEGATIVE
Protein, ur: 30 mg/dL — AB
Specific Gravity, Urine: 1.029 (ref 1.005–1.030)
pH: 5 (ref 5.0–8.0)

## 2023-09-02 LAB — RAPID URINE DRUG SCREEN, HOSP PERFORMED
Amphetamines: NOT DETECTED
Barbiturates: NOT DETECTED
Benzodiazepines: NOT DETECTED
Cocaine: NOT DETECTED
Opiates: NOT DETECTED
Tetrahydrocannabinol: POSITIVE — AB

## 2023-09-02 MED ORDER — RISPERIDONE 0.5 MG PO TABS: 1.0000 mg | ORAL_TABLET | Freq: Every day | ORAL | Status: DC

## 2023-09-02 MED ORDER — ALUM & MAG HYDROXIDE-SIMETH 200-200-20 MG/5ML PO SUSP: 30.0000 mL | Freq: Four times a day (QID) | ORAL | Status: DC | PRN

## 2023-09-02 MED ORDER — RISPERIDONE 2 MG PO TABS
2.0000 mg | ORAL_TABLET | Freq: Every day | ORAL | Status: DC
  Administered 2023-09-02 – 2023-09-03 (×2): 2 mg via ORAL
  Filled 2023-09-02 (×2): qty 1

## 2023-09-02 MED ORDER — ACETAMINOPHEN 325 MG PO TABS: 650.0000 mg | ORAL_TABLET | ORAL | Status: DC | PRN

## 2023-09-02 MED ORDER — ONDANSETRON HCL 4 MG PO TABS: 4.0000 mg | ORAL_TABLET | Freq: Three times a day (TID) | ORAL | Status: DC | PRN

## 2023-09-02 NOTE — ED Provider Notes (Signed)
 Emergency Medicine Observation Re-evaluation Note  Anne Brewer is a 53 y.o. female, seen on rounds today.  Pt initially presented to the ED for complaints of Altered Mental Status and Hypotension Currently, the patient is resting comfortably..  Physical Exam  BP (!) 150/112   Pulse 75   Temp 97.7 F (36.5 C)   Resp 14   LMP 10/06/2016   SpO2 100%  Physical Exam General: No acute distress Cardiac: Regular rate Lungs: No respiratory distress Psych: Currently calm  ED Course / MDM  EKG:EKG Interpretation Date/Time:  Monday Sep 01 2023 21:04:23 EDT Ventricular Rate:  81 PR Interval:  180 QRS Duration:  104 QT Interval:  419 QTC Calculation: 487 R Axis:   25  Text Interpretation: Sinus rhythm Borderline prolonged QT interval Confirmed by Abby Hocking [phone]) on 09/01/2023 9:07:08 PM  I have reviewed the labs performed to date as well as medications administered while in observation.  Recent changes in the last 24 hours include - no new changes.  Patient arrived yesterday.  She still awaiting psych disposition and evaluation.  Plan  Current plan is for holding patient for psychiatric evaluation.Deatra Face, MD 09/02/23 1328

## 2023-09-02 NOTE — ED Notes (Signed)
 Pt is speaking. Aox3. Pt ate breakfast without difficulty.

## 2023-09-02 NOTE — ED Notes (Addendum)
 Patient belongings placed in cabinet labeled [address]. Belongings consist of 1 bag and one black backpack.

## 2023-09-02 NOTE — ED Notes (Signed)
 [facility] received a call from Daniel Ingram Jr., pts half brother to provide collateral information and inquire about pt. Per pts brother, pt moved back to [place]  from [facility] recently. Per pts brother, pt has at least a 10 year history of feeling paranoid that people are chasing her from a situation with pt having a relationship with a man who was involved with another woman. Pt has repeated this story over and over again which is the same story pt relayed to [facility] and provider.   Per pts brother, pt has no known history of mental health illness in their family. Pts brother was told by pt that she has been taking college classes and living in a dorm. Pts brother is unaware of any psychiatric hospitalizations or mental health treatment that pt has received. Pts brother is truck driver who is on the road often and would like to be notified of pts disposition. Pts brother provided two phone contact numbers.  Patt Boozer, [facility]  09/02/23
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
## 2023-09-02 NOTE — ED Notes (Signed)
 [facility] called pts son, Gib Kurk for collateral information. Per pts son pt has "had a hard life". Pt lost her father about 5 years ago and still struggles with the loss. Pts mother committed suicide when pt was a baby. Pts son is unaware of pt having any mental illness or any family history of mental illness though reports that pt has episodes of paranoia and delusional thinking. These episodes usually coincide with stressful life events. Pt has reported to her son that she believes that people are following her when there is no clear evidence of this happening. Per pts son, pt has had ongoing issues with people in [place].  Pt is originally from [place] [facility] where her children currently live. Pt has some contact with her children but son reports not knowing much about his mothers current circumstances. Pts son reports that he believed his mother to be living in a college dorm where she has been going to school. Pts son reports that pt has had bariatric surgery in the past for weight loss. Pt has a stepmother and an Animal nutritionist who live in [place] . Pts son would like to be notified of pts disposition.   Patt Boozer, [facility]  09/02/23

## 2023-09-02 NOTE — ED Notes (Signed)
 [facility] called [employer] [facility] [employer] for collateral information about pt being brought to the [facility] ED. [facility] left a HIPAA compliant message for Mendel Stain, case manager to return the call.   Patt Boozer, [facility]  09/02/23

## 2023-09-02 NOTE — Telephone Encounter (Signed)
 Copied from CRM [id number]. Topic: Referral - Status >> Sep 02, 2023  3:29 PM Stanly Early wrote: Reason for CRM: I spoke with Simonne Dubonnet from [facility] and she's been trying to get in touch with the patient about a rheumatology referral since march. They would like to set up an appt and requests for forms as well. Elena's direct line is [phone] ext [phone]

## 2023-09-03 DIAGNOSIS — F39 Unspecified mood [affective] disorder: Secondary | ICD-10-CM | POA: Diagnosis not present

## 2023-09-03 LAB — SARS CORONAVIRUS 2 BY RT PCR: SARS Coronavirus 2 by RT PCR: NEGATIVE

## 2023-09-03 MED ORDER — PREGABALIN 25 MG PO CAPS
25.0000 mg | ORAL_CAPSULE | Freq: Three times a day (TID) | ORAL | Status: DC
Start: 2023-09-03 — End: 2023-09-04
  Administered 2023-09-03 – 2023-09-04 (×3): 25 mg via ORAL
  Filled 2023-09-03 (×3): qty 1

## 2023-09-03 MED ORDER — SULFASALAZINE 500 MG PO TBEC
500.0000 mg | DELAYED_RELEASE_TABLET | Freq: Two times a day (BID) | ORAL | Status: DC
  Administered 2023-09-03 – 2023-09-04 (×3): 500 mg via ORAL
  Filled 2023-09-03 (×3): qty 1

## 2023-09-03 MED ORDER — IBUPROFEN 200 MG PO TABS
400.0000 mg | ORAL_TABLET | Freq: Four times a day (QID) | ORAL | Status: DC | PRN
  Administered 2023-09-03: 400 mg via ORAL
  Filled 2023-09-03: qty 2

## 2023-09-03 NOTE — Progress Notes (Signed)
 Pt has been accepted to [facility] on 09/03/2023  Bed assignment: [facility]   Pt meets inpatient criteria per: Lady Pier   Attending Physician will be: Dr. Dave Erie   Report can be called to: [phone]  Pt can arrive after NEG COVID   Care Team Notified: Lady Pier NP, Phyliss Breen paramedic   [place] Lulubelle Simcoe LCSW-A   09/03/2023 1:40 PM

## 2023-09-03 NOTE — Progress Notes (Signed)
 LCSW Progress Note  [id number]   JENEVIEVE CLINESMITH  09/03/2023  12:53 PM  Description:   Inpatient Psychiatric Referral  Patient was recommended inpatient per Lady Pier NP. There are no available beds at [facility], per [facility] Kathryn Parish RN. Patient was referred to the following out of network facilities:   Destination  Service Provider Address Phone Fax  [facility] [address] [place] [address] [phone]  [facility] EFAX [address] [place] [phone]  [facility] [address] [place], [place] [address] [phone]  [facility] [address] [place] [address] [phone]  [facility] [address] [place] [address] [phone]  [facility] [address] [place] [address] [phone]      Situation ongoing, CSW to continue following and update chart as more information becomes available.      [place] Samaria Anes, MSW, LCSW  09/03/2023 12:53 PM

## 2023-09-03 NOTE — ED Notes (Signed)
 Called report to Janie at [facility].

## 2023-09-03 NOTE — ED Provider Notes (Signed)
 Emergency Medicine Observation Re-evaluation Note  Anne Brewer is a 53 y.o. female, seen on rounds today.  Pt initially presented to the ED for complaints of chronic delusional thoughts No new c/o this AM, calm, resting.   Physical Exam  BP 116/61 (BP Location: Right Arm)   Pulse 80   Temp 98.5 F (36.9 C) (Oral)   Resp 16   LMP 10/06/2016   SpO2 100%  Physical Exam General: calm, nad. Cardiac: regular rate  Lungs: breathing comfortably. Psych: calm  ED Course / MDM    I have reviewed the labs performed to date as well as medications administered while in observation.  Recent changes in the last 24 hours include ED obs, med management, reassessment.   Plan  From review of notes, it appears patient likely has long history of chronic under-treated mental health illness.  Also w THC use, which may not be helping delusional thoughts.  BH team has started medication and is following - after 35 hours in ED, will ask [facility] team to reassess and clarify disposition plan.       Guadalupe Lee, MD 09/03/23 [phone]
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
## 2023-09-03 NOTE — Telephone Encounter (Signed)
 Call patient with update.
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
## 2023-09-04 DIAGNOSIS — G894 Chronic pain syndrome: Secondary | ICD-10-CM | POA: Diagnosis not present

## 2023-09-04 DIAGNOSIS — F39 Unspecified mood [affective] disorder: Secondary | ICD-10-CM | POA: Diagnosis not present

## 2023-09-04 DIAGNOSIS — Z5901 Sheltered homelessness: Secondary | ICD-10-CM | POA: Diagnosis not present

## 2023-09-04 DIAGNOSIS — F1290 Cannabis use, unspecified, uncomplicated: Secondary | ICD-10-CM | POA: Diagnosis not present

## 2023-09-04 DIAGNOSIS — M25552 Pain in left hip: Secondary | ICD-10-CM | POA: Diagnosis not present

## 2023-09-04 DIAGNOSIS — F209 Schizophrenia, unspecified: Secondary | ICD-10-CM | POA: Diagnosis not present

## 2023-09-04 DIAGNOSIS — M069 Rheumatoid arthritis, unspecified: Secondary | ICD-10-CM | POA: Diagnosis not present

## 2023-09-04 DIAGNOSIS — F1210 Cannabis abuse, uncomplicated: Secondary | ICD-10-CM | POA: Diagnosis not present

## 2023-09-04 DIAGNOSIS — G8929 Other chronic pain: Secondary | ICD-10-CM | POA: Diagnosis not present

## 2023-09-04 NOTE — ED Provider Notes (Signed)
  Physical Exam  BP (!) 146/70 (BP Location: Right Arm)   Pulse 92   Temp 98.4 F (36.9 C) (Oral)   Resp 18   LMP 10/06/2016   SpO2 100%   Physical Exam  Procedures  Procedures  ED Course / MDM    Medical Decision Making Amount and/or Complexity of Data Reviewed Labs: ordered. Radiology: ordered.    Patient with bed placement today.  Discharged into [place] custody.      Afton Horse T, DO 09/04/23 1141

## 2023-09-04 NOTE — Telephone Encounter (Signed)
 I called patient about referral no one answered so I left a voicemail to return my call.

## 2023-09-05 DIAGNOSIS — F209 Schizophrenia, unspecified: Secondary | ICD-10-CM | POA: Diagnosis not present

## 2023-09-05 DIAGNOSIS — F39 Unspecified mood [affective] disorder: Secondary | ICD-10-CM | POA: Diagnosis not present

## 2023-09-05 DIAGNOSIS — G894 Chronic pain syndrome: Secondary | ICD-10-CM | POA: Diagnosis not present

## 2023-09-05 DIAGNOSIS — F1210 Cannabis abuse, uncomplicated: Secondary | ICD-10-CM | POA: Diagnosis not present

## 2023-09-05 DIAGNOSIS — M25552 Pain in left hip: Secondary | ICD-10-CM | POA: Diagnosis not present

## 2023-09-06 DIAGNOSIS — F209 Schizophrenia, unspecified: Secondary | ICD-10-CM | POA: Diagnosis not present

## 2023-09-06 DIAGNOSIS — F39 Unspecified mood [affective] disorder: Secondary | ICD-10-CM | POA: Diagnosis not present

## 2023-09-06 DIAGNOSIS — F1210 Cannabis abuse, uncomplicated: Secondary | ICD-10-CM | POA: Diagnosis not present

## 2023-09-07 DIAGNOSIS — F1210 Cannabis abuse, uncomplicated: Secondary | ICD-10-CM | POA: Diagnosis not present

## 2023-09-07 DIAGNOSIS — F39 Unspecified mood [affective] disorder: Secondary | ICD-10-CM | POA: Diagnosis not present

## 2023-09-07 DIAGNOSIS — F209 Schizophrenia, unspecified: Secondary | ICD-10-CM | POA: Diagnosis not present

## 2023-09-08 DIAGNOSIS — F39 Unspecified mood [affective] disorder: Secondary | ICD-10-CM | POA: Diagnosis not present

## 2023-09-08 DIAGNOSIS — F1210 Cannabis abuse, uncomplicated: Secondary | ICD-10-CM | POA: Diagnosis not present

## 2023-09-08 DIAGNOSIS — F209 Schizophrenia, unspecified: Secondary | ICD-10-CM | POA: Diagnosis not present

## 2023-09-08 DIAGNOSIS — M069 Rheumatoid arthritis, unspecified: Secondary | ICD-10-CM | POA: Diagnosis not present

## 2023-09-08 NOTE — Telephone Encounter (Signed)
 I call patient and made her aware that  I spoke with Simonne Dubonnet from [facility] and she's been trying to get in touch with the patient about a rheumatology referral since march. They would like to set up an appt and requests for forms as well. Elena's direct line is [phone] ext [phone].  I gave patient number and told her she needs to call.

## 2023-09-11 ENCOUNTER — Telehealth: Payer: Self-pay | Admitting: Emergency Medicine
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
## 2023-09-12 ENCOUNTER — Other Ambulatory Visit: Payer: Self-pay | Admitting: Family

## 2023-09-12 DIAGNOSIS — M069 Rheumatoid arthritis, unspecified: Secondary | ICD-10-CM

## 2023-09-12 NOTE — Telephone Encounter (Signed)
 Complete. Also, please provide patient with referral contact information to Rheumatology.

## 2023-09-13 ENCOUNTER — Ambulatory Visit: Payer: Self-pay | Admitting: Family Medicine

## 2023-09-13 LAB — GLUCOSE, POCT (MANUAL RESULT ENTRY): POC Glucose: 109 mg/dL — AB (ref 70–99)

## 2023-09-13 NOTE — Progress Notes (Signed)
 [place] Pt. Privacy practices reviewed and HIPAA form signed. Pt new to the area and currently staying at [employer]. Imformed we are at [facility] every Tuesday to come and follow up.

## 2023-09-16 ENCOUNTER — Ambulatory Visit: Payer: Self-pay | Admitting: *Deleted

## 2023-09-16 NOTE — Telephone Encounter (Signed)
 Noted

## 2023-09-16 NOTE — Congregational Nurse Program (Signed)
  Dept: [phone]   Congregational Nurse Program Note  Date of Encounter: 09/16/2023  Clinic visit to confirm time for initial PCP visit.  Given bus passes, clinic address and phone number after call to clinic to confirm appointment for 8:20 AM on 09/17/23.  Advised to take all her medicines with her to the appointment to discuss continued left hip and knee pain. Past Medical History: Past Medical History:  Diagnosis Date   Anemia    Arthritis     Encounter Details:  Community Questionnaire - 09/16/23 1017       Questionnaire   Ask client: Do you give verbal consent for me to treat you today? Yes    Student Assistance N/A    Location Patient Served  [facility]    Encounter Setting CN site    Population Status Unhoused    Insurance Medicare    Insurance/Financial Assistance Referral N/A    Medication Have Medication Insecurities;Provided Medication Assistance    Medical Provider Yes    Screening Referrals Made N/A    Medical Referrals Made N/A    Medical Appointment Completed N/A    CNP Interventions Advocate/Support;Case Management    Screenings CN Performed N/A    ED Visit Averted N/A    Life-Saving Intervention Made N/A
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
## 2023-09-17 ENCOUNTER — Telehealth: Payer: Self-pay | Admitting: Family

## 2023-09-17 ENCOUNTER — Ambulatory Visit (INDEPENDENT_AMBULATORY_CARE_PROVIDER_SITE_OTHER): Admitting: Family

## 2023-09-17 ENCOUNTER — Encounter: Payer: Self-pay | Admitting: Family

## 2023-09-17 ENCOUNTER — Other Ambulatory Visit: Payer: Self-pay

## 2023-09-17 ENCOUNTER — Other Ambulatory Visit: Payer: Self-pay | Admitting: Family

## 2023-09-17 VITALS — BP 129/84 | HR 80 | Wt 287.8 lb

## 2023-09-17 DIAGNOSIS — R21 Rash and other nonspecific skin eruption: Secondary | ICD-10-CM

## 2023-09-17 DIAGNOSIS — M25552 Pain in left hip: Secondary | ICD-10-CM | POA: Diagnosis not present

## 2023-09-17 DIAGNOSIS — M1990 Unspecified osteoarthritis, unspecified site: Secondary | ICD-10-CM

## 2023-09-17 DIAGNOSIS — G8929 Other chronic pain: Secondary | ICD-10-CM

## 2023-09-17 DIAGNOSIS — M069 Rheumatoid arthritis, unspecified: Secondary | ICD-10-CM

## 2023-09-17 DIAGNOSIS — J40 Bronchitis, not specified as acute or chronic: Secondary | ICD-10-CM | POA: Diagnosis not present

## 2023-09-17 DIAGNOSIS — M79672 Pain in left foot: Secondary | ICD-10-CM

## 2023-09-17 DIAGNOSIS — R609 Edema, unspecified: Secondary | ICD-10-CM | POA: Diagnosis not present

## 2023-09-17 DIAGNOSIS — M25571 Pain in right ankle and joints of right foot: Secondary | ICD-10-CM | POA: Diagnosis not present

## 2023-09-17 DIAGNOSIS — M25561 Pain in right knee: Secondary | ICD-10-CM | POA: Diagnosis not present

## 2023-09-17 DIAGNOSIS — Z124 Encounter for screening for malignant neoplasm of cervix: Secondary | ICD-10-CM

## 2023-09-17 DIAGNOSIS — M79671 Pain in right foot: Secondary | ICD-10-CM | POA: Diagnosis not present

## 2023-09-17 DIAGNOSIS — M25562 Pain in left knee: Secondary | ICD-10-CM

## 2023-09-17 DIAGNOSIS — M25572 Pain in left ankle and joints of left foot: Secondary | ICD-10-CM

## 2023-09-17 MED ORDER — CELECOXIB 200 MG PO CAPS
200.0000 mg | ORAL_CAPSULE | Freq: Two times a day (BID) | ORAL | 2 refills | Status: DC
  Filled 2023-09-17 – 2023-09-25 (×2): qty 60, 30d supply, fill #0
  Filled 2023-10-08: qty 60, 30d supply, fill #1

## 2023-09-17 MED ORDER — FUROSEMIDE 20 MG PO TABS
20.0000 mg | ORAL_TABLET | Freq: Every day | ORAL | 0 refills | Status: DC
  Filled 2023-09-17: qty 7, 7d supply, fill #0

## 2023-09-17 MED ORDER — PREGABALIN 25 MG PO CAPS
25.0000 mg | ORAL_CAPSULE | Freq: Three times a day (TID) | ORAL | 0 refills | Status: DC
  Filled 2023-09-17 – 2023-10-09 (×5): qty 90, 30d supply, fill #0

## 2023-09-17 MED ORDER — AZITHROMYCIN 250 MG PO TABS
ORAL_TABLET | ORAL | 0 refills | Status: AC
  Filled 2023-09-17: qty 6, 5d supply, fill #0

## 2023-09-17 MED ORDER — METHOTREXATE SODIUM 2.5 MG PO TABS
25.0000 mg | ORAL_TABLET | ORAL | 2 refills | Status: DC
Start: 2023-09-17 — End: 2023-10-08
  Filled 2023-09-17 – 2023-10-08 (×3): qty 40, 28d supply, fill #0

## 2023-09-17 MED ORDER — LIDOCAINE 4 % EX PTCH
1.0000 | MEDICATED_PATCH | CUTANEOUS | 1 refills | Status: DC
  Filled 2023-09-17: qty 30, 30d supply, fill #0

## 2023-09-17 NOTE — Telephone Encounter (Signed)
Patient made aware that medications were sent to pharmacy

## 2023-09-17 NOTE — Telephone Encounter (Signed)
 Copied from CRM [id number]. Topic: Clinical - Request for Lab/Test Order >> Sep 17, 2023  2:19 PM Fonda T wrote: Reason for CRM: Abe Abed from [facility] calling requesting to speak to office regarding an order for DVT study on patient.   Please call back at ph. [phone]

## 2023-09-17 NOTE — Telephone Encounter (Signed)
 Spoke with [facility].  PA needed in order to schedule the appt.  Please call [phone] after PA has been completed.
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
## 2023-09-17 NOTE — Telephone Encounter (Signed)
 Complete

## 2023-09-18 ENCOUNTER — Other Ambulatory Visit: Payer: Self-pay

## 2023-09-18 ENCOUNTER — Other Ambulatory Visit: Payer: Self-pay | Admitting: Critical Care Medicine

## 2023-09-18 ENCOUNTER — Encounter: Payer: Self-pay | Admitting: Physician Assistant

## 2023-09-18 DIAGNOSIS — G894 Chronic pain syndrome: Secondary | ICD-10-CM

## 2023-09-18 NOTE — Progress Notes (Signed)
 Pt here today because she is out of some of her meds and wants to check on her appts.   She was recently hospitalized for mental health issues. She was given Risperdal  and it is helping her sleep. No other med changes.  She got cough drops today, they help her.   Her weight is down 8 lbs, to 283, from losing fluid.   She cannot refill the Lyrica  yet, next week.  She was given Lidocaine  patches to help w/ her back pian.   She requested transportation to doctor's appointments. She should be able to use the bus.   We will try to get her rollator wheel fixed today.    Armandina Bernard, PA-C 09/18/2023 10:13 AM

## 2023-09-19 NOTE — Telephone Encounter (Signed)
 Called insurance stated they did not need a PA for this scan with cpt code 16109. I called heart and vascular and they stated that the scasn has already been approved   Reference Call number: [id number]

## 2023-09-24 ENCOUNTER — Encounter: Payer: Self-pay | Admitting: Physician Assistant

## 2023-09-24 ENCOUNTER — Other Ambulatory Visit: Payer: Self-pay | Admitting: Critical Care Medicine

## 2023-09-24 ENCOUNTER — Other Ambulatory Visit: Payer: Self-pay

## 2023-09-24 ENCOUNTER — Other Ambulatory Visit: Payer: Self-pay | Admitting: Physician Assistant

## 2023-09-24 ENCOUNTER — Encounter: Payer: Self-pay | Admitting: *Deleted

## 2023-09-24 DIAGNOSIS — M069 Rheumatoid arthritis, unspecified: Secondary | ICD-10-CM

## 2023-09-24 MED ORDER — SULFASALAZINE 500 MG PO TBEC
500.0000 mg | DELAYED_RELEASE_TABLET | Freq: Two times a day (BID) | ORAL | 3 refills | Status: DC
  Filled 2023-09-24 – 2023-10-08 (×3): qty 60, 30d supply, fill #0

## 2023-09-24 NOTE — Congregational Nurse Program (Signed)
  Dept: [phone]   Congregational Nurse Program Note  Date of Encounter: 09/24/2023  Past Medical History: Past Medical History:  Diagnosis Date   Anemia    Arthritis     Encounter Details:  Community Questionnaire - 09/24/23 1203       Questionnaire   Ask client: Do you give verbal consent for me to treat you today? Yes    Student Assistance N/A    Location Patient Served  [facility]    Encounter Setting CN site    Population Status Unhoused    Insurance Medicare    Insurance/Financial Assistance Referral N/A    Medication Have Medication Insecurities    Medical Provider Yes    Screening Referrals Made N/A    Medical Referrals Made [facility] PCP/Clinic    Medical Appointment Completed N/A    CNP Interventions Advocate/Support;Case Management;Navigate Healthcare System    Screenings CN Performed Blood Pressure    ED Visit Averted N/A    Life-Saving Intervention Made N/A            Client signed sheet to see MD in [facility] clinic. Completed triage form to see MD this afternoon in clinic. Blood pressure 129/71, pulse 87, last menstrual period 10/06/2016, SpO2 97%. Client is requesting help with lt hip pain, medication and Rolator. Rhesa Forsberg W RN CN.

## 2023-09-24 NOTE — Progress Notes (Signed)
 Pt wants a new rollator  Says a new wheel is on there, still wants a whole new one.  Says a piece is missing, the wheel is a little loose, but not in danger of coming off.   She thinks the rollator is used, was advised that it was new when she got it.   She says she does not have Medicaid, says they denied her application.  That may be because she does not have an I.D.  She cannot get an I.D. until she gets glasses.  She is working on things.  She was running low on Lyrica .   She needs to get this from her PCP.  On record review, she had sulfasalazine  sent to [facility] 5/16 and 5 other rx sent to [facility] on 05/21. Info given to Deb and recorded for Jan to pick up. Sulfasalazine   rx resent to [facility]   Armandina Bernard, PA-C 09/24/2023 2:10 PM

## 2023-09-25 ENCOUNTER — Other Ambulatory Visit: Payer: Self-pay

## 2023-09-25 ENCOUNTER — Encounter: Payer: Self-pay | Admitting: *Deleted

## 2023-09-25 NOTE — Congregational Nurse Program (Signed)
  Dept: [phone]   Congregational Nurse Program Note  Date of Encounter: 09/25/2023  Past Medical History: Past Medical History:  Diagnosis Date   Anemia    Arthritis     Encounter Details:  Community Questionnaire - 09/25/23 1416       Questionnaire   Ask client: Do you give verbal consent for me to treat you today? Yes    Student Assistance N/A    Location Patient Served  [facility]    Encounter Setting CN site    Population Status Unhoused    Insurance Medicare    Insurance/Financial Assistance Referral N/A    Medication Have Medication Insecurities;Patient Medications Reviewed;Provided Medication Assistance    Medical Provider Yes    Screening Referrals Made N/A    Medical Referrals Made N/A    Medical Appointment Completed N/A    CNP Interventions Advocate/Support;Navigate Healthcare System;Case Management    Screenings CN Performed N/A    ED Visit Averted N/A    Life-Saving Intervention Made N/A            Picked up medications at [facility] pharmacy per MD request. Per pharmacy Lyrica  can not be placed on acct and sulfasalazine  will not be ready until later today. Brought medications to client at [facility] and explained the other medication would need to be picked up by client. Gave bus passes for transportation.  Morelia Cassells W RN CN

## 2023-09-30 ENCOUNTER — Encounter (HOSPITAL_COMMUNITY)

## 2023-10-01 ENCOUNTER — Other Ambulatory Visit (HOSPITAL_COMMUNITY): Payer: Self-pay

## 2023-10-01 ENCOUNTER — Encounter: Payer: Self-pay | Admitting: Student-PharmD

## 2023-10-01 ENCOUNTER — Ambulatory Visit: Payer: Self-pay | Admitting: Family

## 2023-10-01 ENCOUNTER — Ambulatory Visit (HOSPITAL_COMMUNITY)
Admission: RE | Admit: 2023-10-01 | Discharge: 2023-10-01 | Disposition: A | Discharge status: Home / Self Care | Source: Ambulatory Visit | Attending: Family | Admitting: Family

## 2023-10-01 ENCOUNTER — Ambulatory Visit (INDEPENDENT_AMBULATORY_CARE_PROVIDER_SITE_OTHER): Admitting: Student-PharmD

## 2023-10-01 DIAGNOSIS — I82432 Acute embolism and thrombosis of left popliteal vein: Secondary | ICD-10-CM | POA: Insufficient documentation

## 2023-10-01 DIAGNOSIS — R609 Edema, unspecified: Secondary | ICD-10-CM | POA: Diagnosis not present

## 2023-10-01 MED ORDER — APIXABAN (ELIQUIS) VTE STARTER PACK (10MG AND 5MG)
ORAL_TABLET | ORAL | 0 refills | Status: DC
  Filled 2023-10-01: qty 74, 30d supply, fill #0

## 2023-10-01 NOTE — Patient Instructions (Addendum)
-  We're treating you for a blood clot in your right leg behind your knee.  -Start apixaban  (Eliquis ) 10 mg (two tablets) twice daily for 7 days followed by 5 mg (one tablet) twice daily. -We will help make sure you have access to your refills at your next visit.  -It is important to take your medication around the same time every day.  -Avoid NSAIDs like ibuprofen  (Advil , Motrin ) and naproxen (Aleve) as well as aspirin doses over 100 mg daily. -Tylenol  (acetaminophen ) is the preferred over the counter pain medication to lower the risk of bleeding. -Be sure to alert all of your health care providers that you are taking an anticoagulant prior to starting a new medication or having a procedure. -Monitor for signs and symptoms of bleeding (abnormal bruising, prolonged bleeding, nose bleeds, bleeding from gums, discolored urine, black tarry stools). If you have fallen and hit your head OR if your bleeding is severe or not stopping, seek emergency care.  -Go to the emergency room if emergent signs and symptoms of new clot occur (new or worse swelling and pain in an arm or leg, shortness of breath, chest pain, fast or irregular heartbeats, lightheadedness, dizziness, fainting, coughing up blood) or if you experience a significant color change (pale or blue) in the extremity that has the DVT.  -We recommend you wear compression stockings (20-30 mmHg) as long as you are having swelling or pain. Be sure to purchase the correct size and take them off at night.   If you have any questions or need to reschedule an appointment, please call [phone]. If you are having an emergency, call 911 or present to the nearest emergency room.   What is a DVT?  -Deep vein thrombosis (DVT) is a condition in which a blood clot forms in a vein of the deep venous system which can occur in the lower leg, thigh, pelvis, arm, or neck. This condition is serious and can be life-threatening if the clot travels to the arteries of the  lungs and causing a blockage (pulmonary embolism, PE). A DVT can also damage veins in the leg, which can lead to long-term venous disease, leg pain, swelling, discoloration, and ulcers or sores (post-thrombotic syndrome).  -Treatment may include taking an anticoagulant medication to prevent more clots from forming and the current clot from growing, wearing compression stockings, and/or surgical procedures to remove or dissolve the clot.
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
## 2023-10-02 ENCOUNTER — Ambulatory Visit (INDEPENDENT_AMBULATORY_CARE_PROVIDER_SITE_OTHER): Admitting: Podiatry

## 2023-10-02 ENCOUNTER — Ambulatory Visit: Admitting: Podiatry

## 2023-10-02 ENCOUNTER — Encounter: Payer: Self-pay | Admitting: Podiatry

## 2023-10-02 ENCOUNTER — Ambulatory Visit (INDEPENDENT_AMBULATORY_CARE_PROVIDER_SITE_OTHER)

## 2023-10-02 DIAGNOSIS — M722 Plantar fascial fibromatosis: Secondary | ICD-10-CM

## 2023-10-02 DIAGNOSIS — Q666 Other congenital valgus deformities of feet: Secondary | ICD-10-CM

## 2023-10-02 DIAGNOSIS — M7752 Other enthesopathy of left foot: Secondary | ICD-10-CM | POA: Diagnosis not present

## 2023-10-02 MED ORDER — CICLOPIROX 8 % EX SOLN: Freq: Every day | CUTANEOUS | 2 refills | Status: DC
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
## 2023-10-06 ENCOUNTER — Other Ambulatory Visit: Payer: Self-pay

## 2023-10-07 ENCOUNTER — Other Ambulatory Visit: Payer: Self-pay

## 2023-10-07 DIAGNOSIS — M25562 Pain in left knee: Secondary | ICD-10-CM | POA: Diagnosis not present

## 2023-10-07 DIAGNOSIS — M25561 Pain in right knee: Secondary | ICD-10-CM | POA: Diagnosis not present

## 2023-10-07 DIAGNOSIS — M25569 Pain in unspecified knee: Secondary | ICD-10-CM | POA: Diagnosis not present

## 2023-10-07 DIAGNOSIS — Z79899 Other long term (current) drug therapy: Secondary | ICD-10-CM | POA: Diagnosis not present

## 2023-10-07 DIAGNOSIS — M549 Dorsalgia, unspecified: Secondary | ICD-10-CM | POA: Diagnosis not present

## 2023-10-07 DIAGNOSIS — M79642 Pain in left hand: Secondary | ICD-10-CM | POA: Diagnosis not present

## 2023-10-07 DIAGNOSIS — M0579 Rheumatoid arthritis with rheumatoid factor of multiple sites without organ or systems involvement: Secondary | ICD-10-CM | POA: Diagnosis not present

## 2023-10-07 DIAGNOSIS — M79673 Pain in unspecified foot: Secondary | ICD-10-CM | POA: Diagnosis not present

## 2023-10-07 DIAGNOSIS — M25552 Pain in left hip: Secondary | ICD-10-CM | POA: Diagnosis not present

## 2023-10-07 DIAGNOSIS — M2550 Pain in unspecified joint: Secondary | ICD-10-CM | POA: Diagnosis not present

## 2023-10-07 DIAGNOSIS — M25559 Pain in unspecified hip: Secondary | ICD-10-CM | POA: Diagnosis not present

## 2023-10-07 DIAGNOSIS — M79641 Pain in right hand: Secondary | ICD-10-CM | POA: Diagnosis not present

## 2023-10-07 DIAGNOSIS — M79643 Pain in unspecified hand: Secondary | ICD-10-CM | POA: Diagnosis not present

## 2023-10-07 DIAGNOSIS — M25579 Pain in unspecified ankle and joints of unspecified foot: Secondary | ICD-10-CM | POA: Diagnosis not present

## 2023-10-07 DIAGNOSIS — M1990 Unspecified osteoarthritis, unspecified site: Secondary | ICD-10-CM | POA: Diagnosis not present

## 2023-10-07 DIAGNOSIS — M25551 Pain in right hip: Secondary | ICD-10-CM | POA: Diagnosis not present

## 2023-10-08 ENCOUNTER — Encounter: Payer: Self-pay | Admitting: Physician Assistant

## 2023-10-08 ENCOUNTER — Other Ambulatory Visit: Payer: Self-pay | Admitting: Family

## 2023-10-08 ENCOUNTER — Other Ambulatory Visit: Payer: Self-pay

## 2023-10-08 ENCOUNTER — Telehealth: Payer: Self-pay

## 2023-10-08 ENCOUNTER — Encounter: Payer: Self-pay | Admitting: Critical Care Medicine

## 2023-10-08 ENCOUNTER — Other Ambulatory Visit: Payer: Self-pay | Admitting: Physician Assistant

## 2023-10-08 DIAGNOSIS — R609 Edema, unspecified: Secondary | ICD-10-CM

## 2023-10-08 DIAGNOSIS — M1990 Unspecified osteoarthritis, unspecified site: Secondary | ICD-10-CM

## 2023-10-08 DIAGNOSIS — M069 Rheumatoid arthritis, unspecified: Secondary | ICD-10-CM

## 2023-10-08 MED ORDER — FUROSEMIDE 20 MG PO TABS
20.0000 mg | ORAL_TABLET | Freq: Every day | ORAL | 0 refills | Status: DC
  Filled 2023-10-08: qty 30, 30d supply, fill #0

## 2023-10-08 MED ORDER — CELECOXIB 200 MG PO CAPS
200.0000 mg | ORAL_CAPSULE | Freq: Two times a day (BID) | ORAL | 2 refills | Status: DC
Start: 2023-10-08 — End: 2024-02-04
  Filled 2023-10-08 – 2023-12-11 (×3): qty 60, 30d supply, fill #0
  Filled 2023-12-11 – 2024-01-12 (×2): qty 60, 30d supply, fill #1

## 2023-10-08 MED ORDER — METHOTREXATE SODIUM 2.5 MG PO TABS
25.0000 mg | ORAL_TABLET | ORAL | 2 refills | Status: DC
  Filled 2023-10-08 – 2023-11-11 (×3): qty 40, 28d supply, fill #0

## 2023-10-08 MED ORDER — SULFASALAZINE 500 MG PO TBEC
500.0000 mg | DELAYED_RELEASE_TABLET | Freq: Two times a day (BID) | ORAL | 3 refills | Status: DC
  Filled 2023-10-08 – 2023-11-13 (×4): qty 60, 30d supply, fill #0
  Filled 2023-12-11 (×2): qty 60, 30d supply, fill #1
  Filled 2023-12-11: qty 60, 30d supply, fill #0
  Filled 2024-01-12 (×2): qty 60, 30d supply, fill #1

## 2023-10-08 NOTE — Telephone Encounter (Signed)
 Call place to [facility] and was not able to get anyone on the phone or leave msg.

## 2023-10-08 NOTE — Telephone Encounter (Signed)
 Copied from CRM [id number]. Topic: Medical Record Request - Provider/Facility Request >> Oct 07, 2023 10:31 AM Anne Brewer T wrote: Reason for CRM: Charlene from [facility] requested a call back from provider or nurse as patient has been on medications that they need medical records for.

## 2023-10-08 NOTE — Progress Notes (Signed)
 See note

## 2023-10-08 NOTE — Progress Notes (Signed)
Error. See note.

## 2023-10-08 NOTE — Progress Notes (Unsigned)
 Pt has run out of her Lyrica , she got 90 tabs on 05/21. She has a refill but has not sent it in yet.   Sulfasalazine  was also sent in but not picked up yet.   Dr Clydia Dart sent in Penlac  solution for toenail fungus on 06/05 >> not picked up.   She has the Eliquis  starter pack, has started on this.  DVT Results: 10/01/2023 Right:  - Findings consistent with age indeterminate deep vein thrombosis involving the right popliteal vein. - No cystic structure found in the popliteal fossa.  Left:  - There is no evidence of deep vein thrombosis in the lower extremity. However, portions of this examination were limited- see technologist comments above. - No cystic structure found in the popliteal fossa.  Plan: will pick up 5 meds at [facility] medical. She will pick up Penlac  at [facility] tomorrow.   Prior to Admission medications   Medication Sig Start Date End Date Taking? Authorizing Provider  APIXABAN  (ELIQUIS ) VTE STARTER PACK (10MG  AND 5MG ) Take as directed on package: start with two-5mg  tablets twice daily for 7 days. On day 8, switch to one-5mg  tablet twice daily. 10/01/23   Faye Hoops B, RPH-CPP  celecoxib  (CELEBREX ) 200 MG capsule Take 1 capsule (200 mg total) by mouth 2 (two) times daily. 09/17/23 12/16/23  Senaida Dama, NP  ciclopirox  (PENLAC ) 8 % solution Apply topically at bedtime. Apply over nail and surrounding skin. Apply daily over previous coat. After seven (7) days, may remove with alcohol and continue cycle. 10/02/23   Charity Conch, DPM  furosemide  (LASIX ) 20 MG tablet Take 1 tablet (20 mg total) by mouth daily. 09/17/23   Senaida Dama, NP  lidocaine  4 % Place 1 patch onto the skin daily.  OTC   Rogerio Clay, Amy J, NP  methotrexate  (RHEUMATREX) 2.5 MG tablet Take 10 tablets (25 mg total) by mouth once a week. Caution:Chemotherapy. Protect from light. 09/17/23   Senaida Dama, NP  pregabalin  (LYRICA ) 25 MG capsule Take 1 capsule (25 mg total) by mouth 3 (three) times daily.  09/17/23   Senaida Dama, NP  sulfaSALAzine  (AZULFIDINE ) 500 MG EC tablet Take 1 tablet (500 mg total) by mouth 2 (two) times daily. 09/24/23   Anapaula Severt, Andra Kava, PA-C

## 2023-10-09 ENCOUNTER — Encounter: Payer: Self-pay | Admitting: Critical Care Medicine

## 2023-10-09 ENCOUNTER — Other Ambulatory Visit: Payer: Self-pay | Admitting: Critical Care Medicine

## 2023-10-09 ENCOUNTER — Other Ambulatory Visit: Payer: Self-pay

## 2023-10-09 DIAGNOSIS — M069 Rheumatoid arthritis, unspecified: Secondary | ICD-10-CM

## 2023-10-09 MED ORDER — PREGABALIN 25 MG PO CAPS
25.0000 mg | ORAL_CAPSULE | Freq: Three times a day (TID) | ORAL | 1 refills | Status: DC
  Filled 2023-10-09 – 2023-10-15 (×2): qty 90, 30d supply, fill #0
  Filled 2023-11-07 – 2023-11-13 (×3): qty 90, 30d supply, fill #1

## 2023-10-09 NOTE — Progress Notes (Signed)
 See PA note  Meds refilled and picked up at [facility] pharmacy

## 2023-10-13 ENCOUNTER — Other Ambulatory Visit (HOSPITAL_COMMUNITY): Payer: Self-pay

## 2023-10-13 ENCOUNTER — Encounter: Payer: Self-pay | Admitting: *Deleted

## 2023-10-13 ENCOUNTER — Other Ambulatory Visit: Payer: Self-pay

## 2023-10-13 NOTE — Congregational Nurse Program (Signed)
  Dept: [phone]   Congregational Nurse Program Note  Date of Encounter: 10/13/2023  Past Medical History: Past Medical History:  Diagnosis Date   Anemia    Arthritis     Encounter Details:  Community Questionnaire - 10/13/23 1046       Questionnaire   Ask client: Do you give verbal consent for me to treat you today? Yes    Student Assistance N/A    Location Patient Served  [facility]    Encounter Setting CN site    Population Status Unhoused    Insurance Medicare    Insurance/Financial Assistance Referral N/A    Medication Have Medication Insecurities;Provided Medication Assistance    Medical Provider Yes    Screening Referrals Made N/A    Medical Referrals Made N/A    Medical Appointment Completed N/A    CNP Interventions Advocate/Support;Case Management;Navigate Healthcare System    Screenings CN Performed N/A    ED Visit Averted N/A    Life-Saving Intervention Made N/A         Client seen at [facility] requesting assistance with her medication. Called [facility] pharmacy and client has two medications to pick up. Lyrica  can not be placed on client's account. Per pharmacy call client does not have to show ID as she is in the process of getting an ID made. Explained to client she would have to pick up her medication as it could not be placed on account. She acknowledges understanding.  Ally Knodel W RN CN

## 2023-10-15 ENCOUNTER — Other Ambulatory Visit: Payer: Self-pay | Admitting: Family

## 2023-10-15 ENCOUNTER — Other Ambulatory Visit (HOSPITAL_COMMUNITY): Payer: Self-pay

## 2023-10-15 ENCOUNTER — Other Ambulatory Visit: Payer: Self-pay

## 2023-10-15 NOTE — Telephone Encounter (Signed)
 Copied from CRM [id number]. Topic: Clinical - Medication Refill >> Oct 15, 2023  2:00 PM Oddis Bench wrote: Medication: ciclopirox  (PENLAC ) 8 %  Has the patient contacted their pharmacy? Yes At pharmacy wanted to have meds switched  This is the patient's preferred pharmacy:   [facility] [address] [place] [address] Phone [phone] Fax: [phone]   Is this the correct pharmacy for this prescription? Yes If no, delete pharmacy and type the correct one.   Has the prescription been filled recently? No  Is the patient out of the medication? Yes  Has the patient been seen for an appointment in the last year OR does the patient have an upcoming appointment? Yes  Can we respond through MyChart? No  Agent: Please be advised that Rx refills may take up to 3 business days. We ask that you follow-up with your pharmacy.

## 2023-10-17 ENCOUNTER — Other Ambulatory Visit: Payer: Self-pay

## 2023-10-22 ENCOUNTER — Encounter: Payer: Self-pay | Admitting: *Deleted

## 2023-10-22 NOTE — Congregational Nurse Program (Signed)
  Dept: [phone]   Congregational Nurse Program Note  Date of Encounter: 10/22/2023  Past Medical History: Past Medical History:  Diagnosis Date   Anemia    Arthritis     Encounter Details:  Community Questionnaire - 10/22/23 1043       Questionnaire   Ask client: Do you give verbal consent for me to treat you today? Yes    Student Assistance N/A    Location Patient Served  [facility]    Encounter Setting CN site;Phone/Text/Email    Population Status Unhoused    [employer]    Insurance/Financial Assistance Referral N/A    Medication Have Medication Insecurities;Provided Medication Assistance    Medical Provider Yes    Screening Referrals Made N/A    Medical Referrals Made [facility] PCP/Clinic    Medical Appointment Completed N/A    CNP Interventions Advocate/Support;Case Management;Navigate Healthcare System;Educate    Screenings CN Performed Blood Pressure    ED Visit Averted N/A    Life-Saving Intervention Made N/A         Client seen at [facility] requesting assistance with OTC pain patches for her lt hip, ankle, and knee. Checked vitals. Blood pressure 126/85, pulse 83, last menstrual period 10/06/2016, SpO2 95%. Contacted PA Rhonda Barrett with [facility] and received authorization to give three lidocaine  pain patches OTC to client. She has an upcoming appt with PCP. Instructed client to discuss pain relieving options at her next visit with PCP. Offered support and encouragement. Humza Tallerico W RN CN

## 2023-10-23 ENCOUNTER — Ambulatory Visit: Payer: Self-pay

## 2023-10-23 ENCOUNTER — Encounter: Admitting: Family
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
## 2023-10-23 NOTE — Progress Notes (Signed)
 Erroneous encounter-disregard

## 2023-10-23 NOTE — Telephone Encounter (Signed)
Noted appointment for tomorrow

## 2023-10-24 ENCOUNTER — Telehealth: Payer: Self-pay | Admitting: Family

## 2023-10-24 ENCOUNTER — Ambulatory Visit (INDEPENDENT_AMBULATORY_CARE_PROVIDER_SITE_OTHER): Admitting: Family

## 2023-10-24 ENCOUNTER — Other Ambulatory Visit (HOSPITAL_COMMUNITY): Payer: Self-pay

## 2023-10-24 ENCOUNTER — Encounter: Payer: Self-pay | Admitting: Family

## 2023-10-24 VITALS — BP 124/87 | HR 51 | Temp 98.4°F | Resp 18 | Ht 68.0 in | Wt 303.4 lb

## 2023-10-24 DIAGNOSIS — Z1159 Encounter for screening for other viral diseases: Secondary | ICD-10-CM | POA: Diagnosis not present

## 2023-10-24 DIAGNOSIS — Z1211 Encounter for screening for malignant neoplasm of colon: Secondary | ICD-10-CM

## 2023-10-24 DIAGNOSIS — Z1231 Encounter for screening mammogram for malignant neoplasm of breast: Secondary | ICD-10-CM

## 2023-10-24 DIAGNOSIS — M25562 Pain in left knee: Secondary | ICD-10-CM

## 2023-10-24 DIAGNOSIS — Z124 Encounter for screening for malignant neoplasm of cervix: Secondary | ICD-10-CM

## 2023-10-24 DIAGNOSIS — G4700 Insomnia, unspecified: Secondary | ICD-10-CM | POA: Diagnosis not present

## 2023-10-24 DIAGNOSIS — G8929 Other chronic pain: Secondary | ICD-10-CM | POA: Diagnosis not present

## 2023-10-24 DIAGNOSIS — Z13228 Encounter for screening for other metabolic disorders: Secondary | ICD-10-CM

## 2023-10-24 DIAGNOSIS — Z13220 Encounter for screening for lipoid disorders: Secondary | ICD-10-CM

## 2023-10-24 DIAGNOSIS — Z131 Encounter for screening for diabetes mellitus: Secondary | ICD-10-CM

## 2023-10-24 DIAGNOSIS — Z130 Encounter for screening for diseases of the blood and blood-forming organs and certain disorders involving the immune mechanism: Secondary | ICD-10-CM | POA: Diagnosis not present

## 2023-10-24 DIAGNOSIS — Z0000 Encounter for general adult medical examination without abnormal findings: Secondary | ICD-10-CM | POA: Diagnosis not present

## 2023-10-24 DIAGNOSIS — R609 Edema, unspecified: Secondary | ICD-10-CM | POA: Diagnosis not present

## 2023-10-24 DIAGNOSIS — Z1329 Encounter for screening for other suspected endocrine disorder: Secondary | ICD-10-CM | POA: Diagnosis not present

## 2023-10-24 DIAGNOSIS — Z59819 Housing instability, housed unspecified: Secondary | ICD-10-CM | POA: Diagnosis not present

## 2023-10-24 DIAGNOSIS — M25552 Pain in left hip: Secondary | ICD-10-CM

## 2023-10-24 DIAGNOSIS — Z0120 Encounter for dental examination and cleaning without abnormal findings: Secondary | ICD-10-CM

## 2023-10-24 MED ORDER — LIDOCAINE 4 % EX PTCH
1.0000 | MEDICATED_PATCH | CUTANEOUS | 2 refills | Status: DC
  Filled 2023-10-24: qty 30, 30d supply, fill #0
  Filled 2024-01-20: qty 30, 30d supply, fill #1

## 2023-10-24 MED ORDER — FUROSEMIDE 20 MG PO TABS
20.0000 mg | ORAL_TABLET | Freq: Every day | ORAL | 0 refills | Status: DC | PRN
  Filled 2023-10-24 – 2023-11-03 (×2): qty 90, 90d supply, fill #0

## 2023-10-24 MED ORDER — TRAZODONE HCL 50 MG PO TABS
25.0000 mg | ORAL_TABLET | Freq: Every evening | ORAL | 0 refills | Status: DC | PRN
  Filled 2023-10-24: qty 90, 90d supply, fill #0

## 2023-10-24 NOTE — Telephone Encounter (Signed)
 Pt requesting letter for director of [employer] for extended stay [facility] at due to her pending health issues and hip injury/possible hip replacement surgery.

## 2023-10-24 NOTE — Progress Notes (Signed)
 Still having knee patient , needs a referral for [facility] and Mikki needs gel injection back for her knee, having trouble sleeping
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
## 2023-10-24 NOTE — Telephone Encounter (Signed)
 Will continue with plan discussed with patient during office visit on 10/24/2023.

## 2023-10-24 NOTE — Telephone Encounter (Signed)
 Pt is requesting refills for Celecoxib . Please advise

## 2023-10-25 LAB — HEMOGLOBIN A1C
Est. average glucose Bld gHb Est-mCnc: 105 mg/dL
Hgb A1c MFr Bld: 5.3 % (ref 4.8–5.6)

## 2023-10-25 LAB — CMP14+EGFR
ALT: 15 IU/L (ref 0–32)
AST: 19 IU/L (ref 0–40)
Albumin: 3.5 g/dL — ABNORMAL LOW (ref 3.8–4.9)
Alkaline Phosphatase: 133 IU/L — ABNORMAL HIGH (ref 44–121)
BUN/Creatinine Ratio: 22 (ref 9–23)
BUN: 18 mg/dL (ref 6–24)
Bilirubin Total: <0.2 mg/dL (ref 0.0–1.2)
CO2: 20 mmol/L (ref 20–29)
Calcium: 8.8 mg/dL (ref 8.7–10.2)
Chloride: 106 mmol/L (ref 96–106)
Creatinine, Ser: 0.83 mg/dL (ref 0.57–1.00)
Globulin, Total: 2.4 g/dL (ref 1.5–4.5)
Glucose: 66 mg/dL — ABNORMAL LOW (ref 70–99)
Potassium: 4.3 mmol/L (ref 3.5–5.2)
Sodium: 141 mmol/L (ref 134–144)
Total Protein: 5.9 g/dL — ABNORMAL LOW (ref 6.0–8.5)
eGFR: 84 mL/min/1.73

## 2023-10-25 LAB — CBC
Hematocrit: 33.4 % — ABNORMAL LOW (ref 34.0–46.6)
Hemoglobin: 11.1 g/dL (ref 11.1–15.9)
MCH: 33 pg (ref 26.6–33.0)
MCHC: 33.2 g/dL (ref 31.5–35.7)
MCV: 99 fL — ABNORMAL HIGH (ref 79–97)
Platelets: 259 10*3/uL (ref 150–450)
RBC: 3.36 x10E6/uL — ABNORMAL LOW (ref 3.77–5.28)
RDW: 14.3 % (ref 11.7–15.4)
WBC: 7.7 10*3/uL (ref 3.4–10.8)

## 2023-10-25 LAB — TSH: TSH: 1.3 u[IU]/mL (ref 0.450–4.500)

## 2023-10-25 LAB — LIPID PANEL
Chol/HDL Ratio: 2.2 ratio (ref 0.0–4.4)
Cholesterol, Total: 143 mg/dL (ref 100–199)
HDL: 65 mg/dL
LDL Chol Calc (NIH): 67 mg/dL (ref 0–99)
Triglycerides: 51 mg/dL (ref 0–149)
VLDL Cholesterol Cal: 11 mg/dL (ref 5–40)

## 2023-10-25 LAB — HEPATITIS C ANTIBODY: Hep C Virus Ab: NONREACTIVE

## 2023-10-27 ENCOUNTER — Other Ambulatory Visit (HOSPITAL_COMMUNITY): Payer: Self-pay

## 2023-10-27 ENCOUNTER — Ambulatory Visit: Payer: Self-pay | Admitting: Family

## 2023-10-27 MED ORDER — FERROUS SULFATE 324 MG PO TBEC: 324.0000 mg | DELAYED_RELEASE_TABLET | Freq: Two times a day (BID) | ORAL | 0 refills | Status: DC

## 2023-10-27 MED ORDER — NYSTATIN-TRIAMCINOLONE 100000-0.1 UNIT/GM-% EX OINT: 1.0000 | TOPICAL_OINTMENT | Freq: Two times a day (BID) | CUTANEOUS | 0 refills | Status: AC

## 2023-10-27 MED ORDER — SULFASALAZINE 500 MG PO TBEC
500.0000 mg | DELAYED_RELEASE_TABLET | Freq: Two times a day (BID) | ORAL | 0 refills | Status: DC
  Filled 2023-11-07: qty 60, 30d supply, fill #0

## 2023-10-27 MED ORDER — CELECOXIB 200 MG PO CAPS
200.0000 mg | ORAL_CAPSULE | Freq: Two times a day (BID) | ORAL | 1 refills | Status: DC
  Filled 2023-10-27: qty 60, 30d supply, fill #0

## 2023-10-27 MED ORDER — CICLOPIROX 8 % EX SOLN
CUTANEOUS | 3 refills | Status: DC
  Filled 2023-10-27: qty 6.6, 30d supply, fill #0
  Filled 2023-12-11: qty 6.6, 30d supply, fill #1

## 2023-10-27 NOTE — Telephone Encounter (Signed)
 Noted because I can not contact patient her phone is broken

## 2023-10-27 NOTE — Telephone Encounter (Signed)
 The message was sent to [facility] for advisement. Thank you.
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
## 2023-10-27 NOTE — Telephone Encounter (Signed)
 Noted I tried calling patient however at visit she stated 'her phone was not working

## 2023-10-27 NOTE — Telephone Encounter (Signed)
 Celecoxib  was prescribed on 10/08/2023 for 90 day supply by Shona Shad, PA.

## 2023-10-28 NOTE — Telephone Encounter (Signed)
 I tried to reach the patient: [phone] and had to leave a message requesting a call back.

## 2023-10-29 ENCOUNTER — Encounter: Payer: Self-pay | Admitting: *Deleted

## 2023-10-29 ENCOUNTER — Other Ambulatory Visit: Payer: Self-pay | Admitting: Critical Care Medicine

## 2023-10-29 ENCOUNTER — Encounter: Payer: Self-pay | Admitting: Physician Assistant

## 2023-10-29 DIAGNOSIS — G8929 Other chronic pain: Secondary | ICD-10-CM
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
## 2023-10-29 NOTE — Congregational Nurse Program (Signed)
  Dept: [phone]   Congregational Nurse Program Note  Date of Encounter: 10/29/2023  Past Medical History: Past Medical History:  Diagnosis Date   Anemia    Arthritis     Encounter Details:  Community Questionnaire - 10/29/23 1257       Questionnaire   Ask client: Do you give verbal consent for me to treat you today? Yes    Student Assistance N/A    Location Patient Served  [facility]    Encounter Setting CN site    Population Status Unhoused    Insurance Medicare    Insurance/Financial Assistance Referral N/A    Medication N/A    Medical Provider Yes    Screening Referrals Made N/A    Medical Referrals Made [facility] PCP/Clinic    Medical Appointment Completed N/A    CNP Interventions Advocate/Support;Navigate Healthcare System    Screenings CN Performed Blood Pressure    ED Visit Averted N/A    Life-Saving Intervention Made N/A         Client signed form to see MD in [facility] clinic. She c/o lt ankle and hip pain. Completed triage form to see MD in [facility] clinic today. Blood pressure 134/77, pulse 83, last menstrual period 10/06/2016, SpO2 98%.

## 2023-10-29 NOTE — Progress Notes (Signed)
 referrals

## 2023-10-30 ENCOUNTER — Ambulatory Visit: Attending: Vascular Surgery | Admitting: Student-PharmD

## 2023-10-30 ENCOUNTER — Encounter: Payer: Self-pay | Admitting: Student-PharmD

## 2023-10-30 ENCOUNTER — Other Ambulatory Visit (HOSPITAL_COMMUNITY): Payer: Self-pay

## 2023-10-30 ENCOUNTER — Telehealth: Payer: Self-pay

## 2023-10-30 DIAGNOSIS — I82432 Acute embolism and thrombosis of left popliteal vein: Secondary | ICD-10-CM | POA: Diagnosis not present

## 2023-10-30 MED ORDER — APIXABAN 5 MG PO TABS
5.0000 mg | ORAL_TABLET | Freq: Two times a day (BID) | ORAL | 0 refills | Status: DC
Start: 2023-10-30 — End: 2024-02-25

## 2023-10-30 NOTE — Patient Instructions (Addendum)
-  Continue taking Eliquis  5 mg (1 tablet) twice daily. It is important to take this two times a day (NOT two tablets at once).   -It is important to take your medication around the same time every day.  -Avoid NSAIDs like ibuprofen  (Advil , Motrin ) and naproxen (Aleve) as well as aspirin doses over 100 mg daily. -Tylenol  (acetaminophen ) is the preferred over the counter pain medication to lower the risk of bleeding. -Be sure to alert all of your health care providers that you are taking an anticoagulant prior to starting a new medication or having a procedure. -Monitor for signs and symptoms of bleeding (abnormal bruising, prolonged bleeding, nose bleeds, bleeding from gums, discolored urine, black tarry stools). If you have fallen and hit your head OR if your bleeding is severe or not stopping, seek emergency care.  -Go to the emergency room if emergent signs and symptoms of new clot occur (new or worse swelling and pain in an arm or leg, shortness of breath, chest pain, fast or irregular heartbeats, lightheadedness, dizziness, fainting, coughing up blood) or if you experience a significant color change (pale or blue) in the extremity that has the DVT.  -We recommend you wear compression stockings (20-30 mmHg) as long as you are having swelling or pain. Be sure to purchase the correct size and take them off at night.   If you have any questions or need to reschedule an appointment, please call [phone]. If you are having an emergency, call 911 or present to the nearest emergency room.   What is a DVT?  -Deep vein thrombosis (DVT) is a condition in which a blood clot forms in a vein of the deep venous system which can occur in the lower leg, thigh, pelvis, arm, or neck. This condition is serious and can be life-threatening if the clot travels to the arteries of the lungs and causing a blockage (pulmonary embolism, PE). A DVT can also damage veins in the leg, which can lead to long-term venous  disease, leg pain, swelling, discoloration, and ulcers or sores (post-thrombotic syndrome).  -Treatment may include taking an anticoagulant medication to prevent more clots from forming and the current clot from growing, wearing compression stockings, and/or surgical procedures to remove or dissolve the clot.
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
## 2023-10-30 NOTE — Telephone Encounter (Signed)
 Patient Advocate Encounter  Test billing for Eliquis  on this patients current coverage shows that there is an unmet deductible for 2025 and would cost $383.52 for 30 days and $656.15 for 90 days.  Rachel DEL, CPhT Rx Patient Advocate Phone: [phone]

## 2023-11-03 ENCOUNTER — Other Ambulatory Visit (HOSPITAL_COMMUNITY): Payer: Self-pay

## 2023-11-03 ENCOUNTER — Telehealth: Payer: Self-pay | Admitting: Family

## 2023-11-03 NOTE — Telephone Encounter (Signed)
 Noted

## 2023-11-03 NOTE — Telephone Encounter (Signed)
 Pt is requesting a new referral for orthopedics because she has a balance with [facility] and cannot be seen until she resolves that. Please advise

## 2023-11-06 ENCOUNTER — Other Ambulatory Visit: Payer: Self-pay | Admitting: Family

## 2023-11-06 DIAGNOSIS — G8929 Other chronic pain: Secondary | ICD-10-CM

## 2023-11-06 DIAGNOSIS — M25569 Pain in unspecified knee: Secondary | ICD-10-CM

## 2023-11-06 NOTE — Telephone Encounter (Signed)
 I called patient and made her aware they put her new referral in for ortho so they should be giving her a call

## 2023-11-06 NOTE — Telephone Encounter (Signed)
 Complete

## 2023-11-07 ENCOUNTER — Other Ambulatory Visit: Payer: Self-pay

## 2023-11-07 ENCOUNTER — Other Ambulatory Visit (HOSPITAL_COMMUNITY): Payer: Self-pay

## 2023-11-10 ENCOUNTER — Other Ambulatory Visit (HOSPITAL_COMMUNITY): Payer: Self-pay

## 2023-11-10 ENCOUNTER — Other Ambulatory Visit: Payer: Self-pay | Admitting: Family

## 2023-11-10 DIAGNOSIS — M069 Rheumatoid arthritis, unspecified: Secondary | ICD-10-CM
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
## 2023-11-11 ENCOUNTER — Other Ambulatory Visit: Payer: Self-pay

## 2023-11-11 ENCOUNTER — Other Ambulatory Visit (HOSPITAL_COMMUNITY): Payer: Self-pay

## 2023-11-11 ENCOUNTER — Other Ambulatory Visit: Payer: Self-pay | Admitting: Family

## 2023-11-11 DIAGNOSIS — R609 Edema, unspecified: Secondary | ICD-10-CM

## 2023-11-11 NOTE — Telephone Encounter (Signed)
 Requested Prescriptions  Refused Prescriptions Disp Refills   pregabalin  (LYRICA ) 25 MG capsule 90 capsule 1    Sig: Take 1 capsule (25 mg total) by mouth 3 (three) times daily.     Not Delegated - Neurology:  Anticonvulsants - Controlled - pregabalin  Failed - 11/11/2023  3:52 PM      Failed - This refill cannot be delegated      Passed - Cr in normal range and within 360 days    Creatinine, Ser  Date Value Ref Range Status  10/24/2023 0.83 0.57 - 1.00 mg/dL Final         Passed - Completed PHQ-2 or PHQ-9 in the last 360 days      Passed - Valid encounter within last 12 months    Recent Outpatient Visits           2 weeks ago Annual physical exam   [facility], Amy J, NP   1 month ago Rheumatoid arthritis involving multiple sites, unspecified whether rheumatoid factor present [facility])   [place] [facility], Amy J, NP   3 months ago Rheumatoid arthritis involving multiple sites, unspecified whether rheumatoid factor present [facility])   [place] [facility], Amy J, NP   4 months ago Encounter to establish care   [facility], Amy J, NP               sulfaSALAzine  (AZULFIDINE ) 500 MG EC tablet 60 tablet 0    Sig: Take 1 tablet (500 mg total) by mouth 2 (two) times daily.     Gastroenterology:  Inflammatory Bowel Disease (Oral) - sulfasalazine  Passed - 11/11/2023  3:52 PM      Passed - Cr in normal range and within 90 days    Creatinine, Ser  Date Value Ref Range Status  10/24/2023 0.83 0.57 - 1.00 mg/dL Final         Passed - AST in normal range and within 90 days    AST  Date Value Ref Range Status  10/24/2023 19 0 - 40 IU/L Final         Passed - ALT in normal range and within 90 days    ALT  Date Value Ref Range Status  10/24/2023 15 0 - 32 IU/L Final         Passed - CBC w/Diff completed in the last 3 months      Passed -  Valid encounter within last 6 months    Recent Outpatient Visits           2 weeks ago Annual physical exam   [place] [facility], Amy J, NP   1 month ago Rheumatoid arthritis involving multiple sites, unspecified whether rheumatoid factor present [facility])   [place] [facility], Amy J, NP   3 months ago Rheumatoid arthritis involving multiple sites, unspecified whether rheumatoid factor present [facility])    [facility], Amy J, NP   4 months ago Encounter to establish care   [facility], Greig PARAS, NP

## 2023-11-12 ENCOUNTER — Other Ambulatory Visit: Payer: Self-pay | Admitting: Physician Assistant

## 2023-11-12 ENCOUNTER — Encounter: Payer: Self-pay | Admitting: Physician Assistant

## 2023-11-12 ENCOUNTER — Encounter: Payer: Self-pay | Admitting: *Deleted

## 2023-11-12 ENCOUNTER — Other Ambulatory Visit: Payer: Self-pay

## 2023-11-12 ENCOUNTER — Other Ambulatory Visit (HOSPITAL_COMMUNITY): Payer: Self-pay

## 2023-11-12 DIAGNOSIS — M069 Rheumatoid arthritis, unspecified: Secondary | ICD-10-CM

## 2023-11-12 DIAGNOSIS — R609 Edema, unspecified: Secondary | ICD-10-CM

## 2023-11-12 MED ORDER — METHOTREXATE SODIUM 2.5 MG PO TABS
25.0000 mg | ORAL_TABLET | ORAL | 2 refills | Status: DC
  Filled 2023-11-12 – 2023-12-11 (×3): qty 40, 28d supply, fill #0
  Filled 2023-12-11 – 2024-01-12 (×2): qty 40, 28d supply, fill #1

## 2023-11-12 MED ORDER — FUROSEMIDE 20 MG PO TABS
20.0000 mg | ORAL_TABLET | Freq: Every day | ORAL | 0 refills | Status: DC | PRN
  Filled 2023-11-12 – 2023-11-13 (×2): qty 90, 90d supply, fill #0

## 2023-11-12 NOTE — Progress Notes (Signed)
 See note

## 2023-11-12 NOTE — Progress Notes (Signed)
 Pt here for review of appts and meds.   She was given a list of appts for mammogram 07/30, Pain clinic 07/19, Ortho rehab 07/23, [facility] 07/29, all in July.   Her stuff was gone through. She is upset about this and has reported it. Tylenol  was taken, Lyrica  was taken.   However, upon med review, the Lyrica  was filled on 6/12 and has a refill. The refill was called in as well as sulfasalazine . Methotrexate  and Lasix  were also filled again.   She does not have Medicaid.   She has worked through [employer] and has sent them a note, has not heard back yet.   She cannot access her phone, has a pattern unlock but it is not working. The phone limits tries to q 30 sec, but she has not been able to get the right one. She will need to take it to the phone store and get them to unlock it.   She was given Tylenol  and Lidocaine  patches for joint pain, she is encouraged to f/u with Ortho later this month.   Shona Shad, PA-C 11/12/2023 2:47 PM

## 2023-11-12 NOTE — Congregational Nurse Program (Signed)
  Dept: [phone]   Congregational Nurse Program Note  Date of Encounter: 11/12/2023  Past Medical History: Past Medical History:  Diagnosis Date   Anemia    Arthritis     Encounter Details:  Community Questionnaire - 11/12/23 1032       Questionnaire   Ask client: Do you give verbal consent for me to treat you today? Yes    Student Assistance N/A    Location Patient Served  [facility]    Encounter Setting CN site    Population Status Unhoused    Insurance Medicare    Insurance/Financial Assistance Referral N/A    Medication N/A    Medical Provider Yes    Screening Referrals Made N/A    Medical Referrals Made [facility]    Medical Appointment Completed N/A    CNP Interventions Advocate/Support    Screenings CN Performed Blood Pressure    ED Visit Averted N/A    Life-Saving Intervention Made N/A         Client signed sheet to see Dr Brien in [facility] clinic this afternoon. Client reports she wants to discuss medications, edema, and protein shakes with MD. Completed triage form. Blood pressure (!) 146/77, pulse 65, last menstrual period 10/06/2016, SpO2 97%.  Anne Brewer W RN CN

## 2023-11-13 ENCOUNTER — Other Ambulatory Visit (HOSPITAL_COMMUNITY): Payer: Self-pay

## 2023-11-13 ENCOUNTER — Other Ambulatory Visit: Payer: Self-pay

## 2023-11-13 ENCOUNTER — Encounter: Payer: Self-pay | Admitting: *Deleted

## 2023-11-13 NOTE — Congregational Nurse Program (Signed)
  Dept: [phone]   Congregational Nurse Program Note  Date of Encounter: 11/13/2023  Past Medical History: Past Medical History:  Diagnosis Date   Anemia    Arthritis     Encounter Details:  Community Questionnaire - 11/13/23 1328       Questionnaire   Ask client: Do you give verbal consent for me to treat you today? N/A    Student Assistance N/A    Location Patient Served  [facility]    Encounter Setting CN site    Population Status Unhoused    Insurance Medicare    Insurance/Financial Assistance Referral N/A    Medication Have Medication Insecurities;Provided Medication Assistance    Medical Provider Yes    Screening Referrals Made N/A    Medical Referrals Made N/A    Medical Appointment Completed N/A    CNP Interventions Advocate/Support;Case Management    Screenings CN Performed N/A    ED Visit Averted N/A    Life-Saving Intervention Made N/A         Request received to pick up medication for client. Three medications picked up from [facility] pharmacy. Lyrica  was picked up from [facility] pharmacy. All medication brought to [facility]. Client was not at shelter so medication was  left at [facility] front desk with staff including client's name and bed number.  Landyn Lorincz W RN CN

## 2023-11-17 ENCOUNTER — Telehealth: Payer: Self-pay

## 2023-11-17 NOTE — Telephone Encounter (Signed)
 Copied from CRM [id number]. Topic: General - Other >> Nov 11, 2023 10:07 AM Jayma L wrote: Reason for CRM: patient called in and stated she was seen on 10/23/2023 and she needs to reach the social worker in office to help with social security paperwork. Asking for a callback asap

## 2023-11-17 NOTE — Telephone Encounter (Signed)
 error

## 2023-11-19 ENCOUNTER — Ambulatory Visit: Payer: Self-pay | Admitting: Physical Therapy

## 2023-11-19 NOTE — Telephone Encounter (Signed)
 Slater, if you will please assist patient. Thank you.

## 2023-11-20 ENCOUNTER — Ambulatory Visit: Attending: Critical Care Medicine | Admitting: Physical Therapy

## 2023-11-20 ENCOUNTER — Encounter: Payer: Self-pay | Admitting: Physical Therapy

## 2023-11-20 DIAGNOSIS — M25562 Pain in left knee: Secondary | ICD-10-CM | POA: Diagnosis not present

## 2023-11-20 DIAGNOSIS — R262 Difficulty in walking, not elsewhere classified: Secondary | ICD-10-CM | POA: Diagnosis not present

## 2023-11-20 DIAGNOSIS — M25552 Pain in left hip: Secondary | ICD-10-CM | POA: Insufficient documentation

## 2023-11-20 DIAGNOSIS — R600 Localized edema: Secondary | ICD-10-CM | POA: Insufficient documentation

## 2023-11-20 DIAGNOSIS — G8929 Other chronic pain: Secondary | ICD-10-CM | POA: Insufficient documentation

## 2023-11-20 DIAGNOSIS — M6281 Muscle weakness (generalized): Secondary | ICD-10-CM | POA: Insufficient documentation
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
## 2023-11-20 NOTE — Telephone Encounter (Addendum)
 I tried to reach the patient and had to leave a message requesting a call back.

## 2023-11-20 NOTE — Patient Instructions (Signed)

## 2023-11-25 ENCOUNTER — Ambulatory Visit (INDEPENDENT_AMBULATORY_CARE_PROVIDER_SITE_OTHER): Admitting: Orthopaedic Surgery

## 2023-11-25 ENCOUNTER — Other Ambulatory Visit (INDEPENDENT_AMBULATORY_CARE_PROVIDER_SITE_OTHER): Payer: Self-pay

## 2023-11-25 ENCOUNTER — Other Ambulatory Visit (INDEPENDENT_AMBULATORY_CARE_PROVIDER_SITE_OTHER)

## 2023-11-25 VITALS — Ht 67.0 in | Wt 298.0 lb

## 2023-11-25 DIAGNOSIS — G8929 Other chronic pain: Secondary | ICD-10-CM

## 2023-11-25 DIAGNOSIS — M25572 Pain in left ankle and joints of left foot: Secondary | ICD-10-CM | POA: Diagnosis not present

## 2023-11-25 DIAGNOSIS — M25562 Pain in left knee: Secondary | ICD-10-CM | POA: Diagnosis not present

## 2023-11-25 DIAGNOSIS — Z6841 Body Mass Index (BMI) 40.0 and over, adult: Secondary | ICD-10-CM

## 2023-11-25 MED ORDER — BUPIVACAINE HCL 0.5 % IJ SOLN
2.0000 mL | INTRAMUSCULAR | Status: AC | PRN
  Administered 2023-11-25: 2 mL via INTRA_ARTICULAR

## 2023-11-25 MED ORDER — BUPIVACAINE HCL 0.5 % IJ SOLN
1.0000 mL | INTRAMUSCULAR | Status: AC | PRN
  Administered 2023-11-25: 1 mL via INTRA_ARTICULAR

## 2023-11-25 MED ORDER — METHYLPREDNISOLONE ACETATE 40 MG/ML IJ SUSP
40.0000 mg | INTRAMUSCULAR | Status: AC | PRN
  Administered 2023-11-25: 40 mg via INTRA_ARTICULAR

## 2023-11-25 MED ORDER — LIDOCAINE HCL 1 % IJ SOLN
2.0000 mL | INTRAMUSCULAR | Status: AC | PRN
  Administered 2023-11-25: 2 mL

## 2023-11-25 MED ORDER — LIDOCAINE HCL 1 % IJ SOLN
1.0000 mL | INTRAMUSCULAR | Status: AC | PRN
  Administered 2023-11-25: 1 mL

## 2023-11-25 NOTE — Progress Notes (Signed)
 Office Visit Note   Patient: Anne Brewer           Date of Birth: 09-04-1970           MRN: [id number] Visit Date: 11/25/2023              Requested by: Lorren Greig PARAS, NP [address] [place],  [place] [address] PCP: Lorren Greig PARAS, NP   Assessment & Plan: Visit Diagnoses:  1. Chronic pain of left knee   2. Pain in left ankle and joints of left foot   3. Body mass index 45.0-49.9, adult (HCC)     Plan: History of Present Illness Anne Brewer is a 53 year old female with rheumatoid arthritis and osteoarthritis who presents with left knee and left ankle pain.  She experiences significant pain in her left knee and ankle. Gel injections in her left knee, last administered in November and December, have previously alleviated pain and the sensation of 'rubbing' when stepping down. She is currently on methotrexate  and Celebrex  for rheumatoid arthritis and takes Eliquis  due to a blood clot behind her right knee. Her left leg has experienced swelling. She has a strong aversion to pain and inquires about numbing options for potential injections.  Examination of the left knee shows pain and crepitus with range of motion.  Collaterals and cruciates are stable.  No joint effusion.  Examination of the left ankle shows pain and crepitus with range of motion.  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 Previous gel injections provided relief, but insurance coverage is uncertain. Cortisone injections considered for immediate relief. - Submit prior authorization request for gel injections for the left knee. - Administer cortisone injections to the left knee and left ankle today.  This patient is diagnosed with osteoarthritis of the knee(s).    Radiographs show evidence of joint space narrowing, osteophytes, subchondral sclerosis and/or subchondral cysts.  This patient has knee pain which interferes with  functional and activities of daily living.    This patient has experienced inadequate response, adverse effects and/or intolerance with conservative treatments such as acetaminophen , NSAIDS, topical creams, physical therapy or regular exercise, knee bracing and/or weight loss.   This patient has experienced inadequate response or has a contraindication to intra articular steroid injections for at least 3 months.   This patient is not scheduled to have a total knee replacement within 6 months of starting treatment with viscosupplementation.  Follow-Up Instructions: No follow-ups on file.   Orders:  Orders Placed This Encounter  Procedures   Medium Joint Inj   Large Joint Inj   XR KNEE 3 VIEW LEFT   XR Ankle Complete Left   No orders of the defined types were placed in this encounter.     Procedures: Medium Joint Inj: L ankle on 11/25/2023 11:24 AM Indications: pain Details: 25 G needle Medications: 1 mL lidocaine  1 %; 40 mg methylPREDNISolone  acetate 40 MG/ML; 1 mL bupivacaine  0.5 % Outcome: tolerated well, no immediate complications Patient was prepped and draped in the usual sterile fashion.    Large Joint Inj: L knee on 11/25/2023 11:24 AM Details: 22 G needle Medications: 2 mL bupivacaine  0.5 %; 2 mL lidocaine  1 %; 40 mg methylPREDNISolone  acetate 40 MG/ML Outcome: tolerated well, no immediate complications Patient was prepped and draped in the usual sterile fashion.       Clinical Data: No additional findings.   Subjective: Chief Complaint  Patient presents with   Left Ankle - Pain   Left Knee - Pain    HPI  Review of Systems  Constitutional: Negative.   HENT: Negative.    Eyes: Negative.   Respiratory: Negative.    Cardiovascular: Negative.   Endocrine: Negative.   Musculoskeletal: Negative.   Neurological: Negative.   Hematological: Negative.   Psychiatric/Behavioral: Negative.    All other systems reviewed and are  negative.    Objective: Vital Signs: Ht 5' 7 (1.702 m)   Wt 298 lb (135.2 kg)   LMP 10/06/2016   BMI 46.67 kg/m   Physical Exam Vitals and nursing note reviewed.  Constitutional:      Appearance: She is well-developed.  HENT:     Head: Atraumatic.     Nose: Nose normal.  Eyes:     Extraocular Movements: Extraocular movements intact.  Cardiovascular:     Pulses: Normal pulses.  Pulmonary:     Effort: Pulmonary effort is normal.  Abdominal:     Palpations: Abdomen is soft.  Musculoskeletal:     Cervical back: Neck supple.  Skin:    General: Skin is warm.     Capillary Refill: Capillary refill takes less than 2 seconds.  Neurological:     Mental Status: She is alert. Mental status is at baseline.  Psychiatric:        Behavior: Behavior normal.        Thought Content: Thought content normal.        Judgment: Judgment normal.     Ortho Exam  Specialty Comments:  No specialty comments available.  Imaging: No results found.   PMFS History: Patient Active Problem List   Diagnosis Date Noted   Unspecified mood (affective) disorder (HCC) 09/02/2023   Cannabis abuse 09/02/2023   Abnormal uterine bleeding (AUB) 09/11/2016   Uterine fibroid 09/11/2016   S/P total knee replacement 07/12/2015   Past Medical History:  Diagnosis Date   Anemia    Arthritis     No family history on file.  Past Surgical History:  Procedure Laterality Date   ABDOMINAL SURGERY     CESAREAN SECTION     CHOLECYSTECTOMY     LAPAROSCOPIC GASTRIC SLEEVE RESECTION  2010   TOTAL KNEE ARTHROPLASTY Right 07/12/2015   Procedure: TOTAL KNEE ARTHROPLASTY;  Surgeon: Toribio JULIANNA [name], MD;  Location: [facility] OR;  Service: Orthopedics;  Laterality: Right;   Social History   Occupational History   Not on file  Tobacco Use   Smoking status: Every Day    Current packs/day: 0.25    Average packs/day: 0.3 packs/day for 2.0 years (0.5 ttl pk-yrs)    Types: Cigarettes   Smokeless tobacco: Never   Vaping Use   Vaping status: Never Used  Substance and Sexual Activity   Alcohol use: Yes    Comment: occasionally   Drug use: No   Sexual activity: Not Currently    Birth control/protection: Surgical

## 2023-11-26 ENCOUNTER — Encounter: Payer: Self-pay | Admitting: Family

## 2023-11-26 ENCOUNTER — Ambulatory Visit
Admission: RE | Admit: 2023-11-26 | Discharge: 2023-11-26 | Disposition: A | Discharge status: Home / Self Care | Source: Ambulatory Visit | Attending: Family | Admitting: Family

## 2023-11-26 ENCOUNTER — Ambulatory Visit

## 2023-11-26 DIAGNOSIS — Z1231 Encounter for screening mammogram for malignant neoplasm of breast: Secondary | ICD-10-CM | POA: Diagnosis not present

## 2023-11-27 NOTE — Telephone Encounter (Signed)
 I called the patient and she said she already mailed the information to [facility] and does not need a letter at this time

## 2023-12-03 ENCOUNTER — Encounter: Payer: Self-pay | Admitting: Physician Assistant

## 2023-12-03 NOTE — Progress Notes (Signed)
 She has finally gotten [employer], she got her ID!!  Pt needs a new rollator. The leg is bent, so the wheel does not turn well.  She is requested to get this thru her PCP. I will message Dr Mannie about this. She will need a bariatric rollator.   She got some high-tops, which help her feet be comfortable. The ankle brace does not help.  She saw Ortho on 07/29 and got cortisone shots in L knee and ankle.   Wt Readings from Last 3 Encounters:  11/25/23 298 lb (135.2 kg)  10/24/23 (!) 303 lb 6.4 oz (137.6 kg)  09/17/23 287 lb 12.8 oz (130.5 kg)   Other issues are at baseline.   Shona Shad, PA-C 12/03/2023 4:08 PM

## 2023-12-09 ENCOUNTER — Encounter: Payer: Self-pay | Admitting: Physical Therapy

## 2023-12-09 ENCOUNTER — Ambulatory Visit: Attending: Critical Care Medicine | Admitting: Physical Therapy

## 2023-12-09 DIAGNOSIS — M25562 Pain in left knee: Secondary | ICD-10-CM | POA: Diagnosis not present

## 2023-12-09 DIAGNOSIS — R600 Localized edema: Secondary | ICD-10-CM | POA: Diagnosis not present

## 2023-12-09 DIAGNOSIS — M25552 Pain in left hip: Secondary | ICD-10-CM | POA: Insufficient documentation

## 2023-12-09 DIAGNOSIS — M6281 Muscle weakness (generalized): Secondary | ICD-10-CM | POA: Diagnosis not present

## 2023-12-09 DIAGNOSIS — R262 Difficulty in walking, not elsewhere classified: Secondary | ICD-10-CM | POA: Insufficient documentation

## 2023-12-09 DIAGNOSIS — G8929 Other chronic pain: Secondary | ICD-10-CM | POA: Diagnosis not present

## 2023-12-09 NOTE — Therapy (Addendum)
 OUTPATIENT PHYSICAL THERAPY LOWER EXTREMITY EVALUATION   Patient Name: Anne Brewer MRN: [id number] DOB:January 05, 1971, 53 y.o., female Today's Date: 12/09/2023  END OF SESSION:  PT End of Session - 12/09/23 1108     Visit Number 2    Number of Visits 16    Date for PT Re-Evaluation 01/15/24    Authorization Type [employer]    PT Start Time 1105    PT Stop Time 1145    PT Time Calculation (min) 40 min    Activity Tolerance Patient tolerated treatment well    Behavior During Therapy WFL for tasks assessed/performed           Past Medical History:  Diagnosis Date   Anemia    Arthritis    Past Surgical History:  Procedure Laterality Date   ABDOMINAL SURGERY     CESAREAN SECTION     CHOLECYSTECTOMY     LAPAROSCOPIC GASTRIC SLEEVE RESECTION  2010   TOTAL KNEE ARTHROPLASTY Right 07/12/2015   Procedure: TOTAL KNEE ARTHROPLASTY;  Surgeon: Anne JULIANNA [name], MD;  Location: [facility] OR;  Service: Orthopedics;  Laterality: Right;   Patient Active Problem List   Diagnosis Date Noted   Unspecified mood (affective) disorder (HCC) 09/02/2023   Cannabis abuse 09/02/2023   Abnormal uterine bleeding (AUB) 09/11/2016   Uterine fibroid 09/11/2016   S/P total knee replacement 07/12/2015    PCP: Anne Brewer  REFERRING PROVIDER: Brien Belvie BRAVO, MD  REFERRING DIAG:  [phone] (ICD-10-CM) - Chronic pain of left knee  M25.552,G89.29 (ICD-10-CM) - Chronic left hip pain    THERAPY DIAG:  Pain in left hip  Chronic pain of left knee  Localized edema  Difficulty in walking, not elsewhere classified  Muscle weakness (generalized)  Rationale for Evaluation and Treatment: Rehabilitation  ONSET DATE: 2019  SUBJECTIVE:   SUBJECTIVE STATEMENT: Patient states I need pain mgmt.  She had an XR and confirmed she has bone on bone.  She cont to have L ankle knee and hip pain. Someone broke her walker, she walks in with a cane.    PERTINENT HISTORY: OA and RA, Rt TKA ,  sees Vascular for DVT  PAIN:  Are you having pain? Yes: NPRS scale: 6/10-7/10 Pain location: L hip and L knee   Pain description: tight, swelling  Aggravating factors: cold, AM, activity  Relieving factors: pain patch, resting/sitting, elevation    PRECAUTIONS: Other: DVT   RED FLAGS: None   WEIGHT BEARING RESTRICTIONS: No  FALLS:  Has patient fallen in last 6 months? No  LIVING ENVIRONMENT: Lives with: is homeless Lives in: Transitional housing Stairs: No takes elevator  Has following equipment at home: Environmental consultant - 4 wheeledcane   OCCUPATION: not working, was Music therapist  PLOF: Independent with basic ADLs, Independent with community mobility with device, Vocation/Vocational requirements: not working , and Leisure: limited due to housing status   PATIENT GOALS: I want to get back to the gym and lose 100 lbs   NEXT MD VISIT: not sure   OBJECTIVE:  Note: Objective measures were completed at Evaluation unless otherwise noted.  DIAGNOSTIC FINDINGS: none for hip  L knee and L ankle   EXAM: LEFT KNEE - COMPLETE 4+ VIEW   COMPARISON:  None Available.   FINDINGS: Tricompartmental joint space narrowing and peripheral spurring. No fracture. No erosion or focal bone abnormality. There is a moderate joint effusion. Generalized soft tissue edema.   IMPRESSION: 1. Tricompartmental osteoarthritis. 2. Moderate joint effusion.    EXAM:  LEFT FOOT - COMPLETE 3+ VIEW   COMPARISON:  11/21/2014   FINDINGS: There is no evidence of fracture or dislocation. Minor degenerative change of the first metatarsal phalangeal joint. There is chronic spurring about the dorsal talonavicular. No erosive change. Mild soft tissue edema.   IMPRESSION: 1. Mild soft tissue edema. No acute osseous abnormality. 2. Chronic talonavicular spurring. Minor degenerative change of the first metatarsophalangeal joint.  PATIENT SURVEYS:  LEFS   Score total:  50/80     COGNITION: Overall  cognitive status: Within functional limits for tasks assessed     SENSATION: WFL  EDEMA:  NT but evident in L knee and ankle   MUSCLE LENGTH: Unable to measure   POSTURE: rounded shoulders, forward head, increased thoracic kyphosis, posterior pelvic tilt, and weight shift right  PALPATION: Pain with palpation to ant L hip, lateral and post L hip.    LOWER EXTREMITY ROM:  Passive ROM Right eval Left eval  Hip flexion 90 45  Hip extension    Hip abduction    Hip adduction    Hip internal rotation 20 p 0  Hip external rotation 45 20  Knee flexion 80 75  Knee extension  hyper  Ankle dorsiflexion    Ankle plantarflexion    Ankle inversion    Ankle eversion     (Blank rows = not tested)  LOWER EXTREMITY MMT:  MMT Right eval Left eval  Hip flexion 3 3-  Hip extension    Hip abduction  2  Hip adduction    Hip internal rotation    Hip external rotation    Knee flexion 5 4  Knee extension 5 3+  Ankle dorsiflexion 5 4  Ankle plantarflexion    Ankle inversion    Ankle eversion     (Blank rows = not tested)  LOWER EXTREMITY SPECIAL TESTS:  Hip special tests: Belvie (FABER) test: positive  and did not tolerate   FUNCTIONAL TESTS:  5 times sit to stand: 27 sec  30 seconds chair stand test Timed up and go (TUG): Nt on eval   GAIT: Distance walked: 125 Assistive device utilized: Environmental consultant - 4 wheeled Level of assistance: Modified independence Comments: safe with walker                                                                                                            TREATMENT DATE:    [facility] Adult PT Treatment:                                                DATE: 12/09/23 Therapeutic Exercise: NuStep L4 UE and LE x 5 min  Seated knee extension red band  x 15 each  Hamstring curl red band PT anchoring  Quad set  x 15, sec  Glute set x 15 , 5 sec  March (hip fle previously xion) unable  Clam green band x 15 bilateral  LEs Banded bridge  x 10  Ther  act.  Sit to stand x 10  Standing hip flexor stretch TUG x 3 (with cane) 27 sec, 22 sec, 20 sec  Self care: Home  Went over x-rays with patient, left knee.  Discussed knee hyperextension that she shows in standing and with walking.  Discussed the difference between rheumatoid arthritis and osteoarthritis.  Discussed the likely need for her to have a total knee replacement    [facility] Adult PT Treatment:                                                DATE: 11/20/23 Therapeutic Exercise: Quad set Glute set  Ant hip stretch in standing Sit to stand  Self Care: HEP, aquatics, POC, differential diagnosis    Avoiding compensations    PATIENT EDUCATION:  Education details: see above  Person educated: Patient Education method: Explanation Education comprehension: verbalized understanding, returned demonstration, and needs further education  HOME EXERCISE PROGRAM: Access Code: [email or web address] URL: [email or web address] Date: 11/20/2023 Prepared by: Delon Norma  Exercises - Standing Hip Flexor Stretch  - 1 x daily - 7 x weekly - 1 sets - 5 reps - 30 hold - Supine Lower Trunk Rotation  - 1 x daily - 7 x weekly - 2 sets - 10 reps - 10 hold - Sit to Stand Without Arm Support  - 1 x daily - 7 x weekly - 2 sets - 10 reps - 5 hold - Supine Quad Set  - 1 x daily - 7 x weekly - 2 sets - 10 reps - 5 hold - Supine Gluteal Sets  - 1 x daily - 7 x weekly - 2 sets - 10 reps - 5 hold  ASSESSMENT:  CLINICAL IMPRESSION: Patient is a 53 y.o. female  who was seen today for physical therapy evaluation and treatment for L sided hip, knee pain with ankle pain as well.   OBJECTIVE IMPAIRMENTS: cardiopulmonary status limiting activity, decreased activity tolerance, decreased balance, decreased endurance, decreased knowledge of condition, decreased knowledge of use of DME, decreased mobility, difficulty walking, decreased ROM, decreased strength, hypomobility, increased fascial restrictions, impaired  flexibility, improper body mechanics, obesity, and pain.   ACTIVITY LIMITATIONS: carrying, lifting, bending, standing, squatting, sleeping, stairs, transfers, bed mobility, and locomotion level  PARTICIPATION LIMITATIONS: interpersonal relationship, shopping, and community activity  PERSONAL FACTORS: Past/current experiences, Social background, Time since onset of injury/illness/exacerbation, and 3+ comorbidities: DVT, obesity, chronic issues, RA, OA  are also affecting patient's functional outcome.   REHAB POTENTIAL: Good  CLINICAL DECISION MAKING: Stable/uncomplicated  EVALUATION COMPLEXITY: Low   GOALS: Goals reviewed with patient? Yes  SHORT TERM GOALS: Target date: 12/18/2023   Patient will be able to show independence for initial HEP to include posture, core and hip strength and stability.   Baseline:unknown  Goal status:ongoing   2.  Patient will be able to complete 2 min walk test and or TUG for baseline mobility  Baseline: 22 sec Goal status: MET   3.  Patient will be able to show symmetrical sit to stand x 10  Baseline: leans Rt  Goal status: ongoing   4.  Pt will no longer need to use her UE to transfer from sitting to supine  Baseline: needs UE  Goal status: INITIAL  LONG TERM GOALS: Target date:01/15/2024    Patient will be  independent with final HEP upon discharge from PT and report consistent benefit following exercise completion.    Baseline: unknown  Goal status: INITIAL  2.  Patient will be able to improve 2 min walk test distance by 50 feet or more with LRAD and no increase in pain.   Baseline: NT  Goal status: INITIAL  3.  Patient will be able to demonstrate hip/knee/ankle strength to 4/5 in order to maximize functional mobility, ambulation and lifting.   Baseline: see above  Goal status: INITIAL  4.  Pt will be able to report ability to stand for 20 min with min increase in L hip, knee pain  Baseline: 10 min or less Goal status: INITIAL  5.   TUG score will improve to 15 sec or less with cane  Baseline: 22 sec  Goal status: INITIAL   PLAN:  PT FREQUENCY: 2x/week  PT DURATION: 8 weeks  PLANNED INTERVENTIONS: 97164- PT Re-evaluation, 97750- Physical Performance Testing, 97110-Therapeutic exercises, 97530- Therapeutic activity, 97112- Neuromuscular re-education, 97535- Self Care, 02859- Manual therapy, [phone]- Gait training, [phone]- Aquatic Therapy, Patient/Family education, Stair training, Taping, Joint mobilization, Cryotherapy, and Moist heat  PLAN FOR NEXT SESSION: 2 min walk test, check HEP, TUG, hip mobility as tolerated   Nikos Anglemyer, PT 12/09/2023, 12:35 PM   Delon Norma, PT 12/09/23 12:35 PM Phone: [phone] Fax: [phone]    For all possible CPT codes, reference the Planned Interventions line above.     Check all conditions that are expected to impact treatment: {Conditions expected to impact treatment:Morbid obesity and Social determinants of health   If treatment provided at initial evaluation, no treatment charged due to lack of authorization.    Delon Norma, PT 12/09/23 12:35 PM Phone: [phone] Fax: [phone]

## 2023-12-10 ENCOUNTER — Encounter: Payer: Self-pay | Admitting: Critical Care Medicine

## 2023-12-10 NOTE — Therapy (Signed)
 OUTPATIENT PHYSICAL THERAPY LOWER EXTREMITY EVALUATION   Patient Name: Anne Brewer MRN: [id number] DOB:1970-09-18, 53 y.o., female Today's Date: 12/11/2023  END OF SESSION:  PT End of Session - 12/11/23 1557     Visit Number 3    Number of Visits 16    Date for PT Re-Evaluation 01/15/24    Authorization Type [employer]    Authorization Time Period no auth needed    PT Start Time 1548    PT Stop Time 1630    PT Time Calculation (min) 42 min    Activity Tolerance Patient tolerated treatment well    Behavior During Therapy WFL for tasks assessed/performed            Past Medical History:  Diagnosis Date   Anemia    Arthritis    Past Surgical History:  Procedure Laterality Date   ABDOMINAL SURGERY     CESAREAN SECTION     CHOLECYSTECTOMY     LAPAROSCOPIC GASTRIC SLEEVE RESECTION  2010   TOTAL KNEE ARTHROPLASTY Right 07/12/2015   Procedure: TOTAL KNEE ARTHROPLASTY;  Surgeon: Toribio JULIANNA [name], MD;  Location: [facility] OR;  Service: Orthopedics;  Laterality: Right;   Patient Active Problem List   Diagnosis Date Noted   Unspecified mood (affective) disorder (HCC) 09/02/2023   Cannabis abuse 09/02/2023   Abnormal uterine bleeding (AUB) 09/11/2016   Uterine fibroid 09/11/2016   S/P total knee replacement 07/12/2015    PCP: Lorren No NP  REFERRING PROVIDER: Brien Belvie BRAVO, MD  REFERRING DIAG:  [phone] (ICD-10-CM) - Chronic pain of left knee  M25.552,G89.29 (ICD-10-CM) - Chronic left hip pain    THERAPY DIAG:  Pain in left hip  Chronic pain of left knee  Localized edema  Difficulty in walking, not elsewhere classified  Muscle weakness (generalized)  Rationale for Evaluation and Treatment: Rehabilitation  ONSET DATE: 2019  SUBJECTIVE:   SUBJECTIVE STATEMENT: Patient continues to feel pain in her knee and hip.  She was able to walk on campus today, about 4-5 blocks . I want to get better Im really trying today.    PERTINENT HISTORY: OA  and RA, Rt TKA , sees Vascular for DVT  PAIN:  Are you having pain? Yes: NPRS scale: 6/10-7/10 Pain location: L hip and L knee   Pain description: tight, swelling  Aggravating factors: cold, AM, activity  Relieving factors: pain patch, resting/sitting, elevation    PRECAUTIONS: Other: DVT   RED FLAGS: None   WEIGHT BEARING RESTRICTIONS: No  FALLS:  Has patient fallen in last 6 months? No  LIVING ENVIRONMENT: Lives with: is homeless Lives in: Transitional housing Stairs: No takes elevator  Has following equipment at home: Environmental consultant - 4 wheeledcane   OCCUPATION: not working, was Music therapist  PLOF: Independent with basic ADLs, Independent with community mobility with device, Vocation/Vocational requirements: not working , and Leisure: limited due to housing status   PATIENT GOALS: I want to get back to the gym and lose 100 lbs   NEXT MD VISIT: not sure   OBJECTIVE:  Note: Objective measures were completed at Evaluation unless otherwise noted.  DIAGNOSTIC FINDINGS: none for hip  L knee and L ankle   EXAM: LEFT KNEE - COMPLETE 4+ VIEW   COMPARISON:  None Available.   FINDINGS: Tricompartmental joint space narrowing and peripheral spurring. No fracture. No erosion or focal bone abnormality. There is a moderate joint effusion. Generalized soft tissue edema.   IMPRESSION: 1. Tricompartmental osteoarthritis. 2. Moderate joint effusion.  EXAM: LEFT FOOT - COMPLETE 3+ VIEW   COMPARISON:  11/21/2014   FINDINGS: There is no evidence of fracture or dislocation. Minor degenerative change of the first metatarsal phalangeal joint. There is chronic spurring about the dorsal talonavicular. No erosive change. Mild soft tissue edema.   IMPRESSION: 1. Mild soft tissue edema. No acute osseous abnormality. 2. Chronic talonavicular spurring. Minor degenerative change of the first metatarsophalangeal joint.  PATIENT SURVEYS:  LEFS   Score total:  50/80      COGNITION: Overall cognitive status: Within functional limits for tasks assessed     SENSATION: WFL  EDEMA:  NT but evident in L knee and ankle   MUSCLE LENGTH: Unable to measure   POSTURE: rounded shoulders, forward head, increased thoracic kyphosis, posterior pelvic tilt, and weight shift right  PALPATION: Pain with palpation to ant L hip, lateral and post L hip.    LOWER EXTREMITY ROM:  Passive ROM Right eval Left eval  Hip flexion 90 45  Hip extension    Hip abduction    Hip adduction    Hip internal rotation 20 p 0  Hip external rotation 45 20  Knee flexion 80 75  Knee extension  hyper  Ankle dorsiflexion    Ankle plantarflexion    Ankle inversion    Ankle eversion     (Blank rows = not tested)  LOWER EXTREMITY MMT:  MMT Right eval Left eval  Hip flexion 3 3-  Hip extension    Hip abduction  2  Hip adduction    Hip internal rotation    Hip external rotation    Knee flexion 5 4  Knee extension 5 3+  Ankle dorsiflexion 5 4  Ankle plantarflexion    Ankle inversion    Ankle eversion     (Blank rows = not tested)  LOWER EXTREMITY SPECIAL TESTS:  Hip special tests: Belvie (FABER) test: positive  and did not tolerate   FUNCTIONAL TESTS:  5 times sit to stand: 27 sec  30 seconds chair stand test Timed up and go (TUG): Nt on eval   GAIT: Distance walked: 125 Assistive device utilized: Environmental consultant - 4 wheeled Level of assistance: Modified independence Comments: safe with walker                                                                                                            TREATMENT DATE:   [facility] Adult PT Treatment:                                                DATE: 12/11/23 Therapeutic Exercise: Sit to stand x 5 Pain Wall for partial sit 10 sec hold x 10 Standing hip abduction  x 10  Heel raises x 15  Seated LAQ x 20 Pain on L  Seated hamstring curl GTB x  15  Heel slide with AAROM LLE  Bridging legs on the bolster  x 15 SLR Rt  LE  x 10  from bolster PROM L hip  Self Care:  Knee OA and hip OA  Aquatics    [facility] Adult PT Treatment:                                                DATE: 12/09/23 Therapeutic Exercise: NuStep L4 UE and LE x 5 min  Seated knee extension red band  x 15 each  Hamstring curl red band PT anchoring  Quad set  x 15, sec  Glute set x 15 , 5 sec  March (hip fle previously xion) unable  Clam green band x 15 bilateral LEs Banded bridge  x 10  Ther act.  Sit to stand x 10  Standing hip flexor stretch TUG x 3 (with cane) 27 sec, 22 sec, 20 sec  Self care: Home  Went over x-rays with patient, left knee.  Discussed knee hyperextension that she shows in standing and with walking.  Discussed the difference between rheumatoid arthritis and osteoarthritis.  Discussed the likely need for her to have a total knee replacement    [facility] Adult PT Treatment:                                                DATE: 11/20/23 Therapeutic Exercise: Quad set Glute set  Ant hip stretch in standing Sit to stand  Self Care: HEP, aquatics, POC, differential diagnosis    Avoiding compensations    PATIENT EDUCATION:  Education details: see above  Person educated: Patient Education method: Explanation Education comprehension: verbalized understanding, returned demonstration, and needs further education  HOME EXERCISE PROGRAM: Access Code: [email or web address] URL: [email or web address] Date: 11/20/2023 Prepared by: Delon Norma  Exercises - Standing Hip Flexor Stretch  - 1 x daily - 7 x weekly - 1 sets - 5 reps - 30 hold - Supine Lower Trunk Rotation  - 1 x daily - 7 x weekly - 2 sets - 10 reps - 10 hold - Sit to Stand Without Arm Support  - 1 x daily - 7 x weekly - 2 sets - 10 reps - 5 hold - Supine Quad Set  - 1 x daily - 7 x weekly - 2 sets - 10 reps - 5 hold - Supine Gluteal Sets  - 1 x daily - 7 x weekly - 2 sets - 10 reps - 5 hold  ASSESSMENT:  CLINICAL IMPRESSION: Patient able to tolerate a  bit of standing exercises today with fair tolerance.  She is limited by multiple joint pain L knee in particular.  She would like to see if she can get transportation to the pool which would be very beneficial to her.  She does well with modified mat level exercises.  She is very motivated to improve her body and her mind (is going back to school as well, voc rehab) . Patient will continue to benefit from skilled PT in order to return to PLOF and optimize functional mobility.     OBJECTIVE IMPAIRMENTS: cardiopulmonary status limiting activity, decreased activity tolerance, decreased balance, decreased endurance, decreased knowledge of condition, decreased knowledge of use of DME, decreased mobility, difficulty walking, decreased ROM, decreased strength, hypomobility, increased fascial restrictions, impaired flexibility,  improper body mechanics, obesity, and pain.   ACTIVITY LIMITATIONS: carrying, lifting, bending, standing, squatting, sleeping, stairs, transfers, bed mobility, and locomotion level  PARTICIPATION LIMITATIONS: interpersonal relationship, shopping, and community activity  PERSONAL FACTORS: Past/current experiences, Social background, Time since onset of injury/illness/exacerbation, and 3+ comorbidities: DVT, obesity, chronic issues, RA, OA  are also affecting patient's functional outcome.   REHAB POTENTIAL: Good  CLINICAL DECISION MAKING: Stable/uncomplicated  EVALUATION COMPLEXITY: Low   GOALS: Goals reviewed with patient? Yes  SHORT TERM GOALS: Target date: 12/18/2023   Patient will be able to show independence for initial HEP to include posture, core and hip strength and stability.   Baseline:unknown  Goal status:ongoing   2.  Patient will be able to complete 2 min walk test and or TUG for baseline mobility  Baseline: 22 sec Goal status: MET   3.  Patient will be able to show symmetrical sit to stand x 10  Baseline: leans Rt  Goal status: ongoing   4.  Pt will no  longer need to use her UE to transfer from sitting to supine  Baseline: needs UE  Goal status: INITIAL  LONG TERM GOALS: Target date:01/15/2024    Patient will be independent with final HEP upon discharge from PT and report consistent benefit following exercise completion.    Baseline: unknown  Goal status: INITIAL  2.  Patient will be able to improve 2 min walk test distance by 50 feet or more with LRAD and no increase in pain.   Baseline: NT  Goal status: INITIAL  3.  Patient will be able to demonstrate hip/knee/ankle strength to 4/5 in order to maximize functional mobility, ambulation and lifting.   Baseline: see above  Goal status: INITIAL  4.  Pt will be able to report ability to stand for 20 min with min increase in L hip, knee pain  Baseline: 10 min or less Goal status: INITIAL  5.  TUG score will improve to 15 sec or less with cane  Baseline: 22 sec  Goal status: INITIAL   PLAN:  PT FREQUENCY: 2x/week  PT DURATION: 8 weeks  PLANNED INTERVENTIONS: 97164- PT Re-evaluation, 97750- Physical Performance Testing, 97110-Therapeutic exercises, 97530- Therapeutic activity, 97112- Neuromuscular re-education, 97535- Self Care, 02859- Manual therapy, [phone]- Gait training, [phone]- Aquatic Therapy, Patient/Family education, Stair training, Taping, Joint mobilization, Cryotherapy, and Moist heat  PLAN FOR NEXT SESSION: 2 min walk test, check HEP, TUG, hip mobility as tolerated   Marney Treloar, PT 12/11/2023, 3:59 PM   Delon Norma, PT 12/11/23 3:59 PM Phone: [phone] Fax: [phone]    For all possible CPT codes, reference the Planned Interventions line above.     Check all conditions that are expected to impact treatment: {Conditions expected to impact treatment:Morbid obesity and Social determinants of health   If treatment provided at initial evaluation, no treatment charged due to lack of authorization.    Delon Norma, PT 12/11/23 3:59 PM Phone:  [phone] Fax: [phone]

## 2023-12-11 ENCOUNTER — Encounter: Payer: Self-pay | Admitting: Physical Therapy

## 2023-12-11 ENCOUNTER — Other Ambulatory Visit: Payer: Self-pay

## 2023-12-11 ENCOUNTER — Telehealth: Payer: Self-pay

## 2023-12-11 ENCOUNTER — Other Ambulatory Visit (HOSPITAL_COMMUNITY): Payer: Self-pay

## 2023-12-11 ENCOUNTER — Ambulatory Visit: Admitting: Physical Therapy

## 2023-12-11 DIAGNOSIS — R262 Difficulty in walking, not elsewhere classified: Secondary | ICD-10-CM

## 2023-12-11 DIAGNOSIS — R600 Localized edema: Secondary | ICD-10-CM

## 2023-12-11 DIAGNOSIS — M25552 Pain in left hip: Secondary | ICD-10-CM

## 2023-12-11 DIAGNOSIS — G8929 Other chronic pain: Secondary | ICD-10-CM

## 2023-12-11 DIAGNOSIS — M6281 Muscle weakness (generalized): Secondary | ICD-10-CM

## 2023-12-11 NOTE — Telephone Encounter (Signed)
 Called and left a VM advising patient to return my call concerning gel injection.

## 2023-12-11 NOTE — Patient Instructions (Signed)
 Aquatic Therapy at [facility]-  What to Expect!  Where:   [facility] [address] [place], [place] [address] Rehab phone [phone]  NOTE:  You will receive an automated phone message reminding you of your appt and it will say the appointment is at the [facility] clinic.          How to Prepare: Please make sure you drink 8 ounces of water about one hour prior to your pool session A caregiver may attend if needed with the patient to help assist as needed. A caregiver can sit in the pool room on chair. Please arrive IN YOUR SUIT and 15 minutes prior to your appointment - this helps to avoid delays in starting your session. Please make sure to attend to any toileting needs prior to entering the pool [place] rooms for changing are provided.   There is direct access to the pool deck form the locker room.  You can lock your belongings in a locker with lock provided. Once on the pool deck your therapist will ask if you have signed the Patient  Consent and Assignment of Benefits form before beginning treatment Your therapist may take your blood pressure prior to, during and after your session if indicated We usually try and create a home exercise program based on activities we do in the pool.  Please be thinking about who might be able to assist you in the pool should you need to participate in an aquatic home exercise program at the time of discharge if you need assistance.  Some patients do not want to or do not have the ability to participate in an aquatic home program - this is not a barrier in any way to you participating in aquatic therapy as part of your current therapy plan! After Discharge from PT, you can continue using home program at  the [facility], there is a drop-in fee for $5 ($45 a month)or for 60 years  or older $4.00 ($40 a month for seniors ) or any local [employer] pool.  Memberships for purchase are  available for gym/pool at [facility]  IT IS VERY IMPORTANT THAT YOUR LAST VISIT BE IN THE CLINIC AT [facility] AFTER YOUR LAST AQUATIC VISIT.  PLEASE MAKE SURE THAT YOU HAVE A [address]  APPOINTMENT SCHEDULED.   About the pool: Pool is located approximately 500 FT from the entrance of the building.  Please bring a support person if you need assistance traveling this      distance.   Your therapist will assist you in entering the water; there are two ways to           enter: stairs with railings, and a mechanical lift. Your therapist will determine the most appropriate way for you.  Water temperature is usually between 88-90 degrees  There may be up to 2 other swimmers in the pool at the same time  The pool deck is tile, please wear shoes with good traction if you prefer not to be barefoot.    Contact Info:  For appointment scheduling and cancellations:         Please call the [facility]  PH:[phone]              Aquatic Therapy  Outpatient Rehabilitation @ [facility]       All sessions are 45 minutes

## 2023-12-11 NOTE — Progress Notes (Signed)
 Patient presents to the [place] shelter clinic requesting more lidocaine  patches and a new rollator  I inspected the rollator and it is damaged beyond repair she is likely sitting on it for long periods with a weight of 278 pounds and thus damaging the rear wheel suspensions  The patient now has Medicaid I told the patient to communicate with her primary care provider to obtain a prescription for a rollator through Medicaid.  She is ambulating fairly well with a cane at this time.

## 2023-12-12 ENCOUNTER — Other Ambulatory Visit: Payer: Self-pay

## 2023-12-15 ENCOUNTER — Other Ambulatory Visit: Payer: Self-pay | Admitting: Family

## 2023-12-15 ENCOUNTER — Telehealth: Payer: Self-pay | Admitting: Family

## 2023-12-15 ENCOUNTER — Telehealth: Payer: Self-pay

## 2023-12-15 ENCOUNTER — Other Ambulatory Visit (HOSPITAL_COMMUNITY): Payer: Self-pay

## 2023-12-15 DIAGNOSIS — M069 Rheumatoid arthritis, unspecified: Secondary | ICD-10-CM

## 2023-12-15 NOTE — Telephone Encounter (Signed)
 VOB submitted for Monovisc, left knee

## 2023-12-15 NOTE — Telephone Encounter (Signed)
 Copied from CRM #[id number]. Topic: Clinical - Medication Refill >> Dec 15, 2023 10:20 AM Kathrin PARAS wrote: Medication: pregabalin  (LYRICA ) 25 MG capsule  Has the patient contacted their pharmacy? Yes (Agent: If no, request that the patient contact the pharmacy for the refill. If patient does not wish to contact the pharmacy document the reason why and proceed with request.) (Agent: If yes, when and what did the pharmacy advise?)  This is the patient's preferred pharmacy:  [facility] [phone] - [place], [place] - [address] AT [facility] [address] [facility] [address] [place] [address] Phone: [phone] Fax: [phone]  [facility] [address] [place] [address] Phone: [phone] Fax: [phone]  [place] [facility] [address] [place] [address] Phone: [phone] Fax: [phone]  Is this the correct pharmacy for this prescription? Yes If no, delete pharmacy and type the correct one.   Has the prescription been filled recently? No  Is the patient out of the medication? Yes  Has the patient been seen for an appointment in the last year OR does the patient have an upcoming appointment? Yes  Can we respond through MyChart? No  Agent: Please be advised that Rx refills may take up to 3 business days. We ask that you follow-up with your pharmacy.

## 2023-12-15 NOTE — Telephone Encounter (Signed)
 Copied from CRM #[id number]. Topic: General - Other >> Dec 15, 2023  3:08 PM Anne Brewer wrote: Reason for CRM: Patient called in about her medication refill request for Lyrica . She needs to be able to pick up all of her medication at one time because she doesn't drive (bus rider) and she's currently at shelter.

## 2023-12-15 NOTE — Telephone Encounter (Signed)
 Pt requesting  refills on pregabalin  (LYRICA ) 25 MG capsule

## 2023-12-16 ENCOUNTER — Other Ambulatory Visit (HOSPITAL_COMMUNITY): Payer: Self-pay

## 2023-12-16 ENCOUNTER — Encounter

## 2023-12-16 MED ORDER — PREGABALIN 25 MG PO CAPS
25.0000 mg | ORAL_CAPSULE | Freq: Three times a day (TID) | ORAL | 1 refills | Status: DC
  Filled 2023-12-16: qty 90, 30d supply, fill #0
  Filled 2024-01-12: qty 90, 30d supply, fill #1

## 2023-12-16 NOTE — Telephone Encounter (Signed)
 Complete

## 2023-12-17 ENCOUNTER — Encounter: Payer: Self-pay | Admitting: *Deleted

## 2023-12-17 ENCOUNTER — Encounter: Payer: Self-pay | Admitting: Physician Assistant

## 2023-12-17 ENCOUNTER — Telehealth: Payer: Self-pay

## 2023-12-17 NOTE — Telephone Encounter (Signed)
 Faxed completed PA form to [facility] for Monovisc, left knee PA pending

## 2023-12-17 NOTE — Congregational Nurse Program (Signed)
  Dept: [phone]   Congregational Nurse Program Note  Date of Encounter: 12/17/2023  Past Medical History: Past Medical History:  Diagnosis Date   Anemia    Arthritis     Encounter Details:  Community Questionnaire - 12/17/23 1437       Questionnaire   Ask client: Do you give verbal consent for me to treat you today? Yes    Student Assistance N/A    Location Patient Served  [facility]    Encounter Setting CN site    Population Status Unhoused    Insurance Medicare    Insurance/Financial Assistance Referral N/A    Medication N/A    Medical Provider Yes    Screening Referrals Made N/A    Medical Referrals Made [facility] PCP[facility]Clinic    Medical Appointment Completed N/A    CNP Interventions Advocate/Support    Screenings CN Performed Blood Pressure    ED Visit Averted N/A    Life-Saving Intervention Made N/A         Client requested to see MD in [facility] clinic. Completed triage form. Blood pressure 114/79, pulse 85, last menstrual period 10/06/2016, SpO2 96%.  Chele Cornell W RN CN

## 2023-12-17 NOTE — Telephone Encounter (Signed)
 Medication refilled on yesterday

## 2023-12-17 NOTE — Progress Notes (Signed)
 Pt wants referral to a pain clinic.   It was explained that we are not that familiar with pain clinics and it was suggested that she work thru [employer] for this.   Pt needs bladder pads. She is encouraged to research pelvic floor exercises, says will find those out at her PT appt.   She has chronic knee pain, says is bone-bone. She was given Lido patches, Salon Pas rub and Tylenol .   A knee injection is being worked on, Animal nutritionist in process.   Shona Shad, PA-C 12/17/2023 4:26 PM

## 2023-12-18 ENCOUNTER — Encounter: Payer: Self-pay | Admitting: Physical Therapy

## 2023-12-18 ENCOUNTER — Ambulatory Visit: Admitting: Physical Therapy

## 2023-12-18 ENCOUNTER — Telehealth: Payer: Self-pay

## 2023-12-18 DIAGNOSIS — G8929 Other chronic pain: Secondary | ICD-10-CM

## 2023-12-18 DIAGNOSIS — M25552 Pain in left hip: Secondary | ICD-10-CM | POA: Diagnosis not present

## 2023-12-18 NOTE — Therapy (Signed)
 OUTPATIENT PHYSICAL THERAPY LOWER EXTREMITY TREATMENT    Patient Name: Anne Brewer MRN: [id number] DOB:October 10, 1970, 53 y.o., female Today's Date: 12/18/2023  END OF SESSION:  PT End of Session - 12/18/23 1402     Visit Number 4    Number of Visits 16    Date for PT Re-Evaluation 01/15/24    Authorization Type [place] medicaid submitted 8/21    Authorization Time Period auth required    PT Start Time 0215   pt in restoroom for first 15 min   PT Stop Time 0245    PT Time Calculation (min) 30 min            Past Medical History:  Diagnosis Date   Anemia    Arthritis    Past Surgical History:  Procedure Laterality Date   ABDOMINAL SURGERY     CESAREAN SECTION     CHOLECYSTECTOMY     LAPAROSCOPIC GASTRIC SLEEVE RESECTION  2010   TOTAL KNEE ARTHROPLASTY Right 07/12/2015   Procedure: TOTAL KNEE ARTHROPLASTY;  Surgeon: Toribio JULIANNA [name], MD;  Location: [facility] OR;  Service: Orthopedics;  Laterality: Right;   Patient Active Problem List   Diagnosis Date Noted   Unspecified mood (affective) disorder (HCC) 09/02/2023   Cannabis abuse 09/02/2023   Abnormal uterine bleeding (AUB) 09/11/2016   Uterine fibroid 09/11/2016   S/P total knee replacement 07/12/2015    PCP: Lorren No NP  REFERRING PROVIDER: Brien Belvie BRAVO, MD  REFERRING DIAG:  [phone] (ICD-10-CM) - Chronic pain of left knee  M25.552,G89.29 (ICD-10-CM) - Chronic left hip pain    THERAPY DIAG:  Pain in left hip  Chronic pain of left knee  Rationale for Evaluation and Treatment: Rehabilitation  ONSET DATE: 2019  SUBJECTIVE:   SUBJECTIVE STATEMENT: Patient continues to feel pain in her knee and hip.  Difficulty with HEP due to living arrangements.    PERTINENT HISTORY: OA and RA, Rt TKA , sees Vascular for DVT  PAIN:  Are you having pain? Yes: NPRS scale: 6/10-7/10 Pain location: L hip and L knee   Pain description: tight, swelling  Aggravating factors: cold, AM, activity  Relieving factors:  pain patch, resting/sitting, elevation    PRECAUTIONS: Other: DVT   RED FLAGS: None   WEIGHT BEARING RESTRICTIONS: No  FALLS:  Has patient fallen in last 6 months? No  LIVING ENVIRONMENT: Lives with: is homeless Lives in: Transitional housing Stairs: No takes elevator  Has following equipment at home: Environmental consultant - 4 wheeledcane   OCCUPATION: not working, was Music therapist  PLOF: Independent with basic ADLs, Independent with community mobility with device, Vocation/Vocational requirements: not working , and Leisure: limited due to housing status   PATIENT GOALS: I want to get back to the gym and lose 100 lbs   NEXT MD VISIT: not sure   OBJECTIVE:  Note: Objective measures were completed at Evaluation unless otherwise noted.  DIAGNOSTIC FINDINGS: none for hip  L knee and L ankle   EXAM: LEFT KNEE - COMPLETE 4+ VIEW   COMPARISON:  None Available.   FINDINGS: Tricompartmental joint space narrowing and peripheral spurring. No fracture. No erosion or focal bone abnormality. There is a moderate joint effusion. Generalized soft tissue edema.   IMPRESSION: 1. Tricompartmental osteoarthritis. 2. Moderate joint effusion.    EXAM: LEFT FOOT - COMPLETE 3+ VIEW   COMPARISON:  11/21/2014   FINDINGS: There is no evidence of fracture or dislocation. Minor degenerative change of the first metatarsal phalangeal joint. There is chronic  spurring about the dorsal talonavicular. No erosive change. Mild soft tissue edema.   IMPRESSION: 1. Mild soft tissue edema. No acute osseous abnormality. 2. Chronic talonavicular spurring. Minor degenerative change of the first metatarsophalangeal joint.  PATIENT SURVEYS:  LEFS   Score total:  50/80     COGNITION: Overall cognitive status: Within functional limits for tasks assessed     SENSATION: WFL  EDEMA:  NT but evident in L knee and ankle   MUSCLE LENGTH: Unable to measure   POSTURE: rounded shoulders, forward head,  increased thoracic kyphosis, posterior pelvic tilt, and weight shift right  PALPATION: Pain with palpation to ant L hip, lateral and post L hip.    LOWER EXTREMITY ROM:  Passive ROM Right eval Left eval  Hip flexion 90 45  Hip extension    Hip abduction    Hip adduction    Hip internal rotation 20 p 0  Hip external rotation 45 20  Knee flexion 80 75  Knee extension  hyper  Ankle dorsiflexion    Ankle plantarflexion    Ankle inversion    Ankle eversion     (Blank rows = not tested)  LOWER EXTREMITY MMT:  MMT Right eval Left eval  Hip flexion 3 3-  Hip extension    Hip abduction  2  Hip adduction    Hip internal rotation    Hip external rotation    Knee flexion 5 4  Knee extension 5 3+  Ankle dorsiflexion 5 4  Ankle plantarflexion    Ankle inversion    Ankle eversion     (Blank rows = not tested)  LOWER EXTREMITY SPECIAL TESTS:  Hip special tests: Belvie (FABER) test: positive  and did not tolerate   FUNCTIONAL TESTS:  5 times sit to stand: 27 sec  30 seconds chair stand test Timed up and go (TUG): Nt on eval   GAIT: Distance walked: 125 Assistive device utilized: Environmental consultant - 4 wheeled Level of assistance: Modified independence Comments: safe with walker                                                                                                            TREATMENT DATE:  [facility] Adult PT Treatment:                                                DATE: 12/18/23 Therapeutic Exercise: LAQ- pain SLR from bolster R x 10  Bridge with feet on tall bolster  SAQ 10 x 2 each  PROM hip flexion and ER , hamstring  Seated ball squeeze  Seated left heel slide with cloth  Seated march assited on left with strap x 10 each  Standing heel raise  Standing hip abduction Mini squats at counter- avoid locking knees,left Standing left hip flexion      [facility] Adult PT Treatment:  DATE: 12/11/23 Therapeutic Exercise: Sit  to stand x 5 Pain Wall for partial sit 10 sec hold x 10 Standing hip abduction  x 10  Heel raises x 15  Seated LAQ x 20 Pain on L  Seated hamstring curl GTB x  15  Heel slide with AAROM LLE  Bridging legs on the bolster x 15 SLR Rt LE  x 10  from bolster PROM L hip  Self Care:  Knee OA and hip OA  Aquatics    [facility] Adult PT Treatment:                                                DATE: 12/09/23 Therapeutic Exercise: NuStep L4 UE and LE x 5 min  Seated knee extension red band  x 15 each  Hamstring curl red band PT anchoring  Quad set  x 15, sec  Glute set x 15 , 5 sec  March (hip fle previously xion) unable  Clam green band x 15 bilateral LEs Banded bridge  x 10  Ther act.  Sit to stand x 10  Standing hip flexor stretch TUG x 3 (with cane) 27 sec, 22 sec, 20 sec  Self care: Home  Went over x-rays with patient, left knee.  Discussed knee hyperextension that she shows in standing and with walking.  Discussed the difference between rheumatoid arthritis and osteoarthritis.  Discussed the likely need for her to have a total knee replacement    [facility] Adult PT Treatment:                                                DATE: 11/20/23 Therapeutic Exercise: Quad set Glute set  Ant hip stretch in standing Sit to stand  Self Care: HEP, aquatics, POC, differential diagnosis    Avoiding compensations    PATIENT EDUCATION:  Education details: see above  Person educated: Patient Education method: Explanation Education comprehension: verbalized understanding, returned demonstration, and needs further education  HOME EXERCISE PROGRAM: Access Code: [email or web address] URL: [email or web address] Date: 11/20/2023 Prepared by: Delon Norma  Exercises - Standing Hip Flexor Stretch  - 1 x daily - 7 x weekly - 1 sets - 5 reps - 30 hold - Supine Lower Trunk Rotation  - 1 x daily - 7 x weekly - 2 sets - 10 reps - 10 hold - Sit to Stand Without Arm Support  - 1 x daily - 7 x weekly - 2  sets - 10 reps - 5 hold - Supine Quad Set  - 1 x daily - 7 x weekly - 2 sets - 10 reps - 5 hold - Supine Gluteal Sets  - 1 x daily - 7 x weekly - 2 sets - 10 reps - 5 hold  ASSESSMENT:  CLINICAL IMPRESSION: Pt limited by pain and inability to actively lift left leg. Continued with AROM and AAROM. Difficulty controlling left knee in standing, tends to lock. Worked on avoiding locked knees, activating quads. Shorter treatment due to pt being in the restroom for 15 minutes.    Patient able to tolerate a bit of standing exercises today with fair tolerance.  She is limited by multiple joint pain L knee in particular.  She would like to see  if she can get transportation to the pool which would be very beneficial to her.  She does well with modified mat level exercises.  She is very motivated to improve her body and her mind (is going back to school as well, voc rehab) . Patient will continue to benefit from skilled PT in order to return to PLOF and optimize functional mobility.     OBJECTIVE IMPAIRMENTS: cardiopulmonary status limiting activity, decreased activity tolerance, decreased balance, decreased endurance, decreased knowledge of condition, decreased knowledge of use of DME, decreased mobility, difficulty walking, decreased ROM, decreased strength, hypomobility, increased fascial restrictions, impaired flexibility, improper body mechanics, obesity, and pain.   ACTIVITY LIMITATIONS: carrying, lifting, bending, standing, squatting, sleeping, stairs, transfers, bed mobility, and locomotion level  PARTICIPATION LIMITATIONS: interpersonal relationship, shopping, and community activity  PERSONAL FACTORS: Past/current experiences, Social background, Time since onset of injury/illness/exacerbation, and 3+ comorbidities: DVT, obesity, chronic issues, RA, OA  are also affecting patient's functional outcome.   REHAB POTENTIAL: Good  CLINICAL DECISION MAKING: Stable/uncomplicated  EVALUATION COMPLEXITY:  Low   GOALS: Goals reviewed with patient? Yes  SHORT TERM GOALS: Target date: 12/18/2023   Patient will be able to show independence for initial HEP to include posture, core and hip strength and stability.   Baseline:unknown  Goal status:ongoing   2.  Patient will be able to complete 2 min walk test and or TUG for baseline mobility  Baseline: 22 sec TUG Goal status: MET   3.  Patient will be able to show symmetrical sit to stand x 10  Baseline: leans Rt  Goal status: ongoing   4.  Pt will no longer need to use her UE to transfer from sitting to supine  Baseline: needs UE  12/18/23: assists Left leg up on mat  Goal status: ONGOING  LONG TERM GOALS: Target date:01/15/2024    Patient will be independent with final HEP upon discharge from PT and report consistent benefit following exercise completion.    Baseline: unknown  Goal status: INITIAL  2.  Patient will be able to improve 2 min walk test distance by 50 feet or more with LRAD and no increase in pain.   Baseline: NT  Goal status: INITIAL  3.  Patient will be able to demonstrate hip/knee/ankle strength to 4/5 in order to maximize functional mobility, ambulation and lifting.   Baseline: see above  Goal status: INITIAL  4.  Pt will be able to report ability to stand for 20 min with min increase in L hip, knee pain  Baseline: 10 min or less Goal status: INITIAL  5.  TUG score will improve to 15 sec or less with cane  Baseline: 22 sec  Goal status: INITIAL   PLAN:  PT FREQUENCY: 2x/week  PT DURATION: 8 weeks  PLANNED INTERVENTIONS: 97164- PT Re-evaluation, 97750- Physical Performance Testing, 97110-Therapeutic exercises, 97530- Therapeutic activity, 97112- Neuromuscular re-education, 97535- Self Care, 02859- Manual therapy, [phone]- Gait training, [phone]- Aquatic Therapy, Patient/Family education, Stair training, Taping, Joint mobilization, Cryotherapy, and Moist heat  PLAN FOR NEXT SESSION: 2 min walk test, check HEP,  TUG, hip mobility as tolerated   Harlene Persons, PTA 12/18/23 3:45 PM Phone: [phone] Fax: [phone]      For all possible CPT codes, reference the Planned Interventions line above.     Check all conditions that are expected to impact treatment: {Conditions expected to impact treatment:Morbid obesity and Social determinants of health   If treatment provided at initial evaluation, no treatment charged due to lack  of authorization.    Delon Norma, PT 12/18/23 3:45 PM Phone: [phone] Fax: [phone]

## 2023-12-18 NOTE — Telephone Encounter (Addendum)
 Johm with [facility] called stating that Monovisc is not a preferred product and that the preferred products are Durolane and Euflexxa.  Gel injection has been changed to Durolane.  PA pending# [id number]  VOB has been submitted for Durolane, left knee

## 2023-12-23 ENCOUNTER — Ambulatory Visit: Admitting: Physical Therapy

## 2023-12-23 ENCOUNTER — Encounter: Payer: Self-pay | Admitting: Physical Therapy

## 2023-12-23 DIAGNOSIS — R262 Difficulty in walking, not elsewhere classified: Secondary | ICD-10-CM

## 2023-12-23 DIAGNOSIS — R600 Localized edema: Secondary | ICD-10-CM

## 2023-12-23 DIAGNOSIS — G8929 Other chronic pain: Secondary | ICD-10-CM

## 2023-12-23 DIAGNOSIS — M25552 Pain in left hip: Secondary | ICD-10-CM

## 2023-12-23 NOTE — Therapy (Signed)
 OUTPATIENT PHYSICAL THERAPY LOWER EXTREMITY TREATMENT    Patient Name: Anne Brewer MRN: [id number] DOB:1970-06-01, 53 y.o., female Today's Date: 12/23/2023  END OF SESSION:  PT End of Session - 12/23/23 1411     Visit Number 5    Number of Visits 16    Date for PT Re-Evaluation 01/15/24    Authorization Type [place] medicaid submitted 8/21    Authorization Time Period auth required    PT Start Time 1415    PT Stop Time 1456    PT Time Calculation (min) 41 min            Past Medical History:  Diagnosis Date   Anemia    Arthritis    Past Surgical History:  Procedure Laterality Date   ABDOMINAL SURGERY     CESAREAN SECTION     CHOLECYSTECTOMY     LAPAROSCOPIC GASTRIC SLEEVE RESECTION  2010   TOTAL KNEE ARTHROPLASTY Right 07/12/2015   Procedure: TOTAL KNEE ARTHROPLASTY;  Surgeon: Toribio JULIANNA [name], MD;  Location: [facility] OR;  Service: Orthopedics;  Laterality: Right;   Patient Active Problem List   Diagnosis Date Noted   Unspecified mood (affective) disorder (HCC) 09/02/2023   Cannabis abuse 09/02/2023   Abnormal uterine bleeding (AUB) 09/11/2016   Uterine fibroid 09/11/2016   S/P total knee replacement 07/12/2015    PCP: Lorren No NP  REFERRING PROVIDER: Brien Belvie BRAVO, MD  REFERRING DIAG:  [phone] (ICD-10-CM) - Chronic pain of left knee  M25.552,G89.29 (ICD-10-CM) - Chronic left hip pain    THERAPY DIAG:  Pain in left hip  Chronic pain of left knee  Localized edema  Difficulty in walking, not elsewhere classified  Rationale for Evaluation and Treatment: Rehabilitation  ONSET DATE: 2019  SUBJECTIVE:   SUBJECTIVE STATEMENT: Pt reports that she has been doing the best she can with her exercises but her living arrangement makes this difficult.   PERTINENT HISTORY: OA and RA, Rt TKA , sees Vascular for DVT  PAIN:  Are you having pain? Yes: NPRS scale: 6/10-7/10 Pain location: L hip and L knee   Pain description: tight, swelling   Aggravating factors: cold, AM, activity  Relieving factors: pain patch, resting/sitting, elevation    PRECAUTIONS: Other: DVT   RED FLAGS: None   WEIGHT BEARING RESTRICTIONS: No  FALLS:  Has patient fallen in last 6 months? No  LIVING ENVIRONMENT: Lives with: is homeless Lives in: Transitional housing Stairs: No takes elevator  Has following equipment at home: Environmental consultant - 4 wheeledcane   OCCUPATION: not working, was Music therapist  PLOF: Independent with basic ADLs, Independent with community mobility with device, Vocation/Vocational requirements: not working , and Leisure: limited due to housing status   PATIENT GOALS: I want to get back to the gym and lose 100 lbs   NEXT MD VISIT: not sure   OBJECTIVE:  Note: Objective measures were completed at Evaluation unless otherwise noted.  DIAGNOSTIC FINDINGS: none for hip  L knee and L ankle   EXAM: LEFT KNEE - COMPLETE 4+ VIEW   COMPARISON:  None Available.   FINDINGS: Tricompartmental joint space narrowing and peripheral spurring. No fracture. No erosion or focal bone abnormality. There is a moderate joint effusion. Generalized soft tissue edema.   IMPRESSION: 1. Tricompartmental osteoarthritis. 2. Moderate joint effusion.    EXAM: LEFT FOOT - COMPLETE 3+ VIEW   COMPARISON:  11/21/2014   FINDINGS: There is no evidence of fracture or dislocation. Minor degenerative change of the first metatarsal phalangeal  joint. There is chronic spurring about the dorsal talonavicular. No erosive change. Mild soft tissue edema.   IMPRESSION: 1. Mild soft tissue edema. No acute osseous abnormality. 2. Chronic talonavicular spurring. Minor degenerative change of the first metatarsophalangeal joint.  PATIENT SURVEYS:  LEFS   Score total:  50/80     COGNITION: Overall cognitive status: Within functional limits for tasks assessed     SENSATION: WFL  EDEMA:  NT but evident in L knee and ankle   MUSCLE  LENGTH: Unable to measure   POSTURE: rounded shoulders, forward head, increased thoracic kyphosis, posterior pelvic tilt, and weight shift right  PALPATION: Pain with palpation to ant L hip, lateral and post L hip.    LOWER EXTREMITY ROM:  Passive ROM Right eval Left eval  Hip flexion 90 45  Hip extension    Hip abduction    Hip adduction    Hip internal rotation 20 p 0  Hip external rotation 45 20  Knee flexion 80 75  Knee extension  hyper  Ankle dorsiflexion    Ankle plantarflexion    Ankle inversion    Ankle eversion     (Blank rows = not tested)  LOWER EXTREMITY MMT:  MMT Right eval Left eval  Hip flexion 3 3-  Hip extension    Hip abduction  2  Hip adduction    Hip internal rotation    Hip external rotation    Knee flexion 5 4  Knee extension 5 3+  Ankle dorsiflexion 5 4  Ankle plantarflexion    Ankle inversion    Ankle eversion     (Blank rows = not tested)  LOWER EXTREMITY SPECIAL TESTS:  Hip special tests: Belvie (FABER) test: positive  and did not tolerate   FUNCTIONAL TESTS:  5 times sit to stand: 27 sec  30 seconds chair stand test Timed up and go (TUG): Nt on eval   GAIT: Distance walked: 125 Assistive device utilized: Environmental consultant - 4 wheeled Level of assistance: Modified independence Comments: safe with walker                                                                                                            TREATMENT DATE:  [facility] Adult PT Treatment:                                                DATE: 12/23/23 Therapeutic Exercise: LAQ - 5x5 Bridge with feet on tall bolster - 4x5 SAQ 10 x 2 each  PROM hip flexion and ER , hamstring  Seated ball squeeze  Seated clam - GTB - 2x10 Seated left heel slide with cloth  Seated march assited on left with strap x 10 each   Therapeutic Activity  Standing heel raise  Standing hip abduction   [facility] Adult PT Treatment:  DATE:  12/11/23 Therapeutic Exercise: Sit to stand x 5 Pain Wall for partial sit 10 sec hold x 10 Standing hip abduction  x 10  Heel raises x 15  Seated LAQ x 20 Pain on L  Seated hamstring curl GTB x  15  Heel slide with AAROM LLE  Bridging legs on the bolster x 15 SLR Rt LE  x 10  from bolster PROM L hip  Self Care:  Knee OA and hip OA  Aquatics    [facility] Adult PT Treatment:                                                DATE: 12/09/23 Therapeutic Exercise: NuStep L4 UE and LE x 5 min  Seated knee extension red band  x 15 each  Hamstring curl red band PT anchoring  Quad set  x 15, sec  Glute set x 15 , 5 sec  March (hip fle previously xion) unable  Clam green band x 15 bilateral LEs Banded bridge  x 10  Ther act.  Sit to stand x 10  Standing hip flexor stretch TUG x 3 (with cane) 27 sec, 22 sec, 20 sec  Self care: Home  Went over x-rays with patient, left knee.  Discussed knee hyperextension that she shows in standing and with walking.  Discussed the difference between rheumatoid arthritis and osteoarthritis.  Discussed the likely need for her to have a total knee replacement    [facility] Adult PT Treatment:                                                DATE: 11/20/23 Therapeutic Exercise: Quad set Glute set  Ant hip stretch in standing Sit to stand  Self Care: HEP, aquatics, POC, differential diagnosis    Avoiding compensations    PATIENT EDUCATION:  Education details: see above  Person educated: Patient Education method: Explanation Education comprehension: verbalized understanding, returned demonstration, and needs further education  HOME EXERCISE PROGRAM: Access Code: [email or web address] URL: [email or web address] Date: 11/20/2023 Prepared by: Delon Norma  Exercises - Standing Hip Flexor Stretch  - 1 x daily - 7 x weekly - 1 sets - 5 reps - 30 hold - Supine Lower Trunk Rotation  - 1 x daily - 7 x weekly - 2 sets - 10 reps - 10 hold - Sit to Stand Without Arm  Support  - 1 x daily - 7 x weekly - 2 sets - 10 reps - 5 hold - Supine Quad Set  - 1 x daily - 7 x weekly - 2 sets - 10 reps - 5 hold - Supine Gluteal Sets  - 1 x daily - 7 x weekly - 2 sets - 10 reps - 5 hold  ASSESSMENT:  CLINICAL IMPRESSION: Pt with poor tolerance d/t pain.  Unable to complete SLR or hip flexion without UE assist with strap.  Pt cued for pacing throughout d/t tendency to complete exercises rapidly.  Recommended pool appts for several visits d/t poor tolerance on land.   Patient able to tolerate a bit of standing exercises today with fair tolerance.  She is limited by multiple joint pain L knee in particular.  She would like to see if she  can get transportation to the pool which would be very beneficial to her.  She does well with modified mat level exercises.  She is very motivated to improve her body and her mind (is going back to school as well, voc rehab) . Patient will continue to benefit from skilled PT in order to return to PLOF and optimize functional mobility.     OBJECTIVE IMPAIRMENTS: cardiopulmonary status limiting activity, decreased activity tolerance, decreased balance, decreased endurance, decreased knowledge of condition, decreased knowledge of use of DME, decreased mobility, difficulty walking, decreased ROM, decreased strength, hypomobility, increased fascial restrictions, impaired flexibility, improper body mechanics, obesity, and pain.   ACTIVITY LIMITATIONS: carrying, lifting, bending, standing, squatting, sleeping, stairs, transfers, bed mobility, and locomotion level  PARTICIPATION LIMITATIONS: interpersonal relationship, shopping, and community activity  PERSONAL FACTORS: Past/current experiences, Social background, Time since onset of injury/illness/exacerbation, and 3+ comorbidities: DVT, obesity, chronic issues, RA, OA  are also affecting patient's functional outcome.   REHAB POTENTIAL: Good  CLINICAL DECISION MAKING:  Stable/uncomplicated  EVALUATION COMPLEXITY: Low   GOALS: Goals reviewed with patient? Yes  SHORT TERM GOALS: Target date: 12/18/2023   Patient will be able to show independence for initial HEP to include posture, core and hip strength and stability.   Baseline:unknown  Goal status:ongoing   2.  Patient will be able to complete 2 min walk test and or TUG for baseline mobility  Baseline: 22 sec TUG Goal status: MET   3.  Patient will be able to show symmetrical sit to stand x 10  Baseline: leans Rt  Goal status: ongoing   4.  Pt will no longer need to use her UE to transfer from sitting to supine  Baseline: needs UE  12/18/23: assists Left leg up on mat  Goal status: ONGOING  LONG TERM GOALS: Target date:01/15/2024    Patient will be independent with final HEP upon discharge from PT and report consistent benefit following exercise completion.    Baseline: unknown  Goal status: INITIAL  2.  Patient will be able to improve 2 min walk test distance by 50 feet or more with LRAD and no increase in pain.   Baseline: NT  Goal status: INITIAL  3.  Patient will be able to demonstrate hip/knee/ankle strength to 4/5 in order to maximize functional mobility, ambulation and lifting.   Baseline: see above  Goal status: INITIAL  4.  Pt will be able to report ability to stand for 20 min with min increase in L hip, knee pain  Baseline: 10 min or less Goal status: INITIAL  5.  TUG score will improve to 15 sec or less with cane  Baseline: 22 sec  Goal status: INITIAL   PLAN:  PT FREQUENCY: 2x/week  PT DURATION: 8 weeks  PLANNED INTERVENTIONS: 97164- PT Re-evaluation, 97750- Physical Performance Testing, 97110-Therapeutic exercises, 97530- Therapeutic activity, 97112- Neuromuscular re-education, 97535- Self Care, 02859- Manual therapy, [phone]- Gait training, [phone]- Aquatic Therapy, Patient/Family education, Stair training, Taping, Joint mobilization, Cryotherapy, and Moist  heat  PLAN FOR NEXT SESSION: 2 min walk test, check HEP, TUG, hip mobility as tolerated   Harlene Persons, PTA 12/23/23 4:37 PM Phone: [phone] Fax: [phone]      For all possible CPT codes, reference the Planned Interventions line above.     Check all conditions that are expected to impact treatment: {Conditions expected to impact treatment:Morbid obesity and Social determinants of health   If treatment provided at initial evaluation, no treatment charged due to lack of authorization.  Helene BRAVO Virginio Isidore PT 12/23/23 4:37 PM Phone: [phone] Fax: [phone]

## 2023-12-24 ENCOUNTER — Encounter: Payer: Self-pay | Admitting: Physician Assistant

## 2023-12-24 NOTE — Progress Notes (Addendum)
 [facility]  S: Here with concerns regarding knee pain and her injections.    O: There were no vitals filed for this visit. General: No acute distress. Lungs: unlabored Neurological: Alert. Moves all extremities 4 with equal strength. Cranial nerves II through XII grossly intact.  A/P: Encouraged to call orthopedics office.  Looks they're working on prior serbia.  She'll need to get pain management doc referral from PCP.  Meliton Monte, MD  ------  Pt reiterates that someone is messing with her medical records.   Someone cut her income off, SS Disability, she says they did not get records they were supposed to get.  She wants to get R knee injections. The Ins Co rejected Monovisc and they are now doing VOB (verification of benefits) for Durolane. Pt requested to contact Ortho MD for this. She was given Lido patches and Tylenol  for pain. Requests referral to pain clinic, was requested to contact PCP for this.   She has seen P.T. twice this week.

## 2023-12-25 ENCOUNTER — Telehealth: Payer: Self-pay

## 2023-12-25 ENCOUNTER — Other Ambulatory Visit: Payer: Self-pay

## 2023-12-25 DIAGNOSIS — G8929 Other chronic pain: Secondary | ICD-10-CM

## 2023-12-25 NOTE — Telephone Encounter (Signed)
 Patient is scheduled see Referrals tab

## 2023-12-31 ENCOUNTER — Encounter: Payer: Self-pay | Admitting: Critical Care Medicine

## 2023-12-31 ENCOUNTER — Other Ambulatory Visit (HOSPITAL_COMMUNITY): Payer: Self-pay

## 2023-12-31 ENCOUNTER — Other Ambulatory Visit: Payer: Self-pay | Admitting: Critical Care Medicine

## 2023-12-31 DIAGNOSIS — R609 Edema, unspecified: Secondary | ICD-10-CM

## 2023-12-31 DIAGNOSIS — G894 Chronic pain syndrome: Secondary | ICD-10-CM

## 2023-12-31 DIAGNOSIS — G4700 Insomnia, unspecified: Secondary | ICD-10-CM

## 2023-12-31 MED ORDER — TRAZODONE HCL 50 MG PO TABS
25.0000 mg | ORAL_TABLET | Freq: Every evening | ORAL | 0 refills | Status: DC | PRN
  Filled 2023-12-31: qty 90, 90d supply, fill #0

## 2023-12-31 MED ORDER — FUROSEMIDE 20 MG PO TABS
20.0000 mg | ORAL_TABLET | Freq: Every day | ORAL | 0 refills | Status: AC | PRN
  Filled 2023-12-31: qty 90, 90d supply, fill #0
  Filled 2024-01-12 – 2024-03-15 (×2): qty 30, 30d supply, fill #0
  Filled 2024-03-15: qty 30, 30d supply, fill #1

## 2023-12-31 MED ORDER — FERROUS SULFATE 324 (65 FE) MG PO TBEC
324.0000 mg | DELAYED_RELEASE_TABLET | Freq: Two times a day (BID) | ORAL | 0 refills | Status: DC
  Filled 2023-12-31: qty 60, 30d supply, fill #0

## 2024-01-01 ENCOUNTER — Other Ambulatory Visit (HOSPITAL_COMMUNITY): Payer: Self-pay

## 2024-01-01 ENCOUNTER — Other Ambulatory Visit: Payer: Self-pay

## 2024-01-01 ENCOUNTER — Encounter: Payer: Self-pay | Admitting: *Deleted

## 2024-01-01 NOTE — Congregational Nurse Program (Signed)
  Dept: [phone]   Congregational Nurse Program Note  Date of Encounter: 01/01/2024  Past Medical History: Past Medical History:  Diagnosis Date   Anemia    Arthritis     Encounter Details:  Community Questionnaire - 01/01/24 1355       Questionnaire   Ask client: Do you give verbal consent for me to treat you today? N/A    Student Assistance N/A    Location Patient Served  [facility]    Encounter Setting CN site    Population Status Unhoused    Insurance Medicaid    Insurance/Financial Assistance Referral N/A    Medication Have Medication Insecurities    Medical Provider Yes    Screening Referrals Made N/A    Medical Referrals Made N/A    Medical Appointment Completed N/A    CNP Interventions Advocate/Support;Case Management    Screenings CN Performed N/A    ED Visit Averted N/A    Life-Saving Intervention Made N/A         Writer went to pick up medications at [facility] per MD request. Writer could only pick up trazodone . Per pharmacy, it is too early to pick up lasix  and iron OTC can not be placed on an account. MD made aware. Client could not be located at [facility]. Medication was left at [facility] front desk with staff including client's name and bed number. Dearra Myhand W RN CN

## 2024-01-01 NOTE — Progress Notes (Signed)
 Chief complaint:  Pt wanted to check on her referral to a new pain clinic now that she is on a new insurance provider: [employer]  Requested Tylenol  and lidocaine  patches     PE  Not performed     Medication management:  Refills ordered on the following:  furosemide  (LASIX ) 20 MG tablet  traZODone  (DESYREL ) 50 MG tablet  ferrous sulfate  324 (65 Fe)     Tylenol  and lidocaine  patches given     Referrals:  Pain management: Dr. Brien will place a new referral     Appointments:  None  Shawnee Ill PA-C  I have seen and examined this patient with the mid-level provider and agree with the above note . Belvie WrightMD

## 2024-01-01 NOTE — Congregational Nurse Program (Signed)
  Dept: [phone]   Congregational Nurse Program Note  Date of Encounter: 12/30/2023  Resident clinic visit due to pain left hip, triage form completed for 9/3 MD clinic at [facility]. Past Medical History: Past Medical History:  Diagnosis Date   Anemia    Arthritis     Encounter Details:  Community Questionnaire - 12/30/23 1050       Questionnaire   Ask client: Do you give verbal consent for me to treat you today? Yes    Student Assistance N/A    Location Patient Served  [facility]    Encounter Setting CN site    Population Status Unhoused    Insurance Medicare    Insurance/Financial Assistance Referral N/A    Medication Patient Medications Reviewed    Medical Provider Yes    Screening Referrals Made N/A    Medical Referrals Made Non-[facility] PCP/Clinic    Medical Appointment Completed [facility] PCP/Clinic    CNP Interventions Advocate/Support;Counsel;[facility]igate [facility]    Screenings CN Performed Blood Pressure    ED Visit Averted N/A    Life-Saving Intervention Made N/A

## 2024-01-05 ENCOUNTER — Ambulatory Visit: Attending: Podiatry | Admitting: Physical Therapy

## 2024-01-05 ENCOUNTER — Encounter: Payer: Self-pay | Admitting: Physical Therapy

## 2024-01-05 DIAGNOSIS — M25562 Pain in left knee: Secondary | ICD-10-CM | POA: Insufficient documentation

## 2024-01-05 DIAGNOSIS — G8929 Other chronic pain: Secondary | ICD-10-CM | POA: Diagnosis not present

## 2024-01-05 DIAGNOSIS — M25552 Pain in left hip: Secondary | ICD-10-CM | POA: Insufficient documentation

## 2024-01-05 NOTE — Therapy (Addendum)
 PHYSICAL THERAPY UNPLANNED DISCHARGE SUMMARY   Visits from Start of Care: 6  Current functional level related to goals / functional outcomes: Current status unknown   Remaining deficits: Current status unknown   Education / Equipment: Pt has not returned since visit listed below  Patient goals were not assessed. Patient is being discharged due to not returning since the last visit.  (the note below was addended to include the above D/C summary on 01/21/24)   Patient Name: Anne Brewer MRN: [id number] DOB:December 09, 1970, 53 y.o., female, female Today's Date: 01/05/2024  END OF SESSION:  PT End of Session - 01/05/24 1418     Visit Number 6    Number of Visits 16    Date for PT Re-Evaluation 01/15/24    Authorization Type [place] medicaid submitted 8/21    Authorization Time Period auth required    PT Start Time 1417    PT Stop Time 1557    PT Time Calculation (min) 100 min            Past Medical History:  Diagnosis Date   Anemia    Arthritis    Past Surgical History:  Procedure Laterality Date   ABDOMINAL SURGERY     CESAREAN SECTION     CHOLECYSTECTOMY     LAPAROSCOPIC GASTRIC SLEEVE RESECTION  2010   TOTAL KNEE ARTHROPLASTY Right 07/12/2015   Procedure: TOTAL KNEE ARTHROPLASTY;  Surgeon: Toribio JULIANNA [name], MD;  Location: [facility] OR;  Service: Orthopedics;  Laterality: Right;   Patient Active Problem List   Diagnosis Date Noted   Unspecified mood (affective) disorder (HCC) 09/02/2023   Cannabis abuse 09/02/2023   Abnormal uterine bleeding (AUB) 09/11/2016   Uterine fibroid 09/11/2016   S/P total knee replacement 07/12/2015    PCP: Lorren No NP  REFERRING PROVIDER: Brien Belvie BRAVO, MD  REFERRING DIAG:  [phone] (ICD-10-CM) - Chronic pain of left knee  M25.552,G89.29 (ICD-10-CM) - Chronic left hip pain    THERAPY DIAG:  Pain in left hip  Chronic pain of left knee  Rationale for Evaluation and Treatment: Rehabilitation  ONSET DATE: 2019  SUBJECTIVE:    SUBJECTIVE STATEMENT: Pt reports that she has been working though her pain but continues to have severe pain.   PERTINENT HISTORY: OA and RA, Rt TKA , sees Vascular for DVT  PAIN:  Are you having pain? Yes: NPRS scale: 6/10-7/10 Pain location: L hip and L knee   Pain description: tight, swelling  Aggravating factors: cold, AM, activity  Relieving factors: pain patch, resting/sitting, elevation    PRECAUTIONS: Other: DVT   RED FLAGS: None   WEIGHT BEARING RESTRICTIONS: No  FALLS:  Has patient fallen in last 6 months? No  LIVING ENVIRONMENT: Lives with: is homeless Lives in: Transitional housing Stairs: No takes elevator  Has following equipment at home: Environmental consultant - 4 wheeledcane   OCCUPATION: not working, was Music therapist  PLOF: Independent with basic ADLs, Independent with community mobility with device, Vocation/Vocational requirements: not working , and Leisure: limited due to housing status   PATIENT GOALS: I want to get back to the gym and lose 100 lbs   NEXT MD VISIT: not sure   OBJECTIVE:  Note: Objective measures were completed at Evaluation unless otherwise noted.  DIAGNOSTIC FINDINGS: none for hip  L knee and L ankle   EXAM: LEFT KNEE - COMPLETE 4+ VIEW   COMPARISON:  None Available.   FINDINGS: Tricompartmental joint space narrowing and peripheral spurring. No fracture. No erosion or focal  bone abnormality. There is a moderate joint effusion. Generalized soft tissue edema.   IMPRESSION: 1. Tricompartmental osteoarthritis. 2. Moderate joint effusion.    EXAM: LEFT FOOT - COMPLETE 3+ VIEW   COMPARISON:  11/21/2014   FINDINGS: There is no evidence of fracture or dislocation. Minor degenerative change of the first metatarsal phalangeal joint. There is chronic spurring about the dorsal talonavicular. No erosive change. Mild soft tissue edema.   IMPRESSION: 1. Mild soft tissue edema. No acute osseous abnormality. 2. Chronic talonavicular  spurring. Minor degenerative change of the first metatarsophalangeal joint.  PATIENT SURVEYS:  LEFS   Score total:  50/80     COGNITION: Overall cognitive status: Within functional limits for tasks assessed     SENSATION: WFL  EDEMA:  NT but evident in L knee and ankle   MUSCLE LENGTH: Unable to measure   POSTURE: rounded shoulders, forward head, increased thoracic kyphosis, posterior pelvic tilt, and weight shift right  PALPATION: Pain with palpation to ant L hip, lateral and post L hip.    LOWER EXTREMITY ROM:  Passive ROM Right eval Left eval  Hip flexion 90 45  Hip extension    Hip abduction    Hip adduction    Hip internal rotation 20 p 0  Hip external rotation 45 20  Knee flexion 80 75  Knee extension  hyper  Ankle dorsiflexion    Ankle plantarflexion    Ankle inversion    Ankle eversion     (Blank rows = not tested)  LOWER EXTREMITY MMT:  MMT Right eval Left eval  Hip flexion 3 3-  Hip extension    Hip abduction  2  Hip adduction    Hip internal rotation    Hip external rotation    Knee flexion 5 4  Knee extension 5 3+  Ankle dorsiflexion 5 4  Ankle plantarflexion    Ankle inversion    Ankle eversion     (Blank rows = not tested)  LOWER EXTREMITY SPECIAL TESTS:  Hip special tests: Belvie (FABER) test: positive  and did not tolerate   FUNCTIONAL TESTS:  5 times sit to stand: 27 sec  30 seconds chair stand test Timed up and go (TUG): Nt on eval   GAIT: Distance walked: 125 Assistive device utilized: Walker - 4 wheeled Level of assistance: Modified independence Comments: safe with walker                                                                                                            TREATMENT DATE:  [facility] Adult PT Treatment:                                                DATE: 01/05/24 Therapeutic Exercise: LAQ - 3x10 Bridge with feet on tall bolster - 4x5 SLR with assist Bosu quad set - 2x10 Seated ball squeeze   Seated clam - Black TB -  2x10 Seated left heel slide with cloth  Seated march assited on left with strap x 10 each   Therapeutic Activity  Standing heel raise - stopped d/t pain    [facility] Adult PT Treatment:                                                DATE: 12/11/23 Therapeutic Exercise: Sit to stand x 5 Pain Wall for partial sit 10 sec hold x 10 Standing hip abduction  x 10  Heel raises x 15  Seated LAQ x 20 Pain on L  Seated hamstring curl GTB x  15  Heel slide with AAROM LLE  Bridging legs on the bolster x 15 SLR Rt LE  x 10  from bolster PROM L hip  Self Care:  Knee OA and hip OA  Aquatics    [facility] Adult PT Treatment:                                                DATE: 12/09/23 Therapeutic Exercise: NuStep L4 UE and LE x 5 min  Seated knee extension red band  x 15 each  Hamstring curl red band PT anchoring  Quad set  x 15, sec  Glute set x 15 , 5 sec  March (hip fle previously xion) unable  Clam green band x 15 bilateral LEs Banded bridge  x 10  Ther act.  Sit to stand x 10  Standing hip flexor stretch TUG x 3 (with cane) 27 sec, 22 sec, 20 sec  Self care: Home  Went over x-rays with patient, left knee.  Discussed knee hyperextension that she shows in standing and with walking.  Discussed the difference between rheumatoid arthritis and osteoarthritis.  Discussed the likely need for her to have a total knee replacement    [facility] Adult PT Treatment:                                                DATE: 11/20/23 Therapeutic Exercise: Quad set Glute set  Ant hip stretch in standing Sit to stand  Self Care: HEP, aquatics, POC, differential diagnosis    Avoiding compensations    PATIENT EDUCATION:  Education details: see above  Person educated: Patient Education method: Explanation Education comprehension: verbalized understanding, returned demonstration, and needs further education  HOME EXERCISE PROGRAM: Access Code: [email or web address] URL:  [email or web address] Date: 11/20/2023 Prepared by: Delon Norma  Exercises - Standing Hip Flexor Stretch  - 1 x daily - 7 x weekly - 1 sets - 5 reps - 30 hold - Supine Lower Trunk Rotation  - 1 x daily - 7 x weekly - 2 sets - 10 reps - 10 hold - Sit to Stand Without Arm Support  - 1 x daily - 7 x weekly - 2 sets - 10 reps - 5 hold - Supine Quad Set  - 1 x daily - 7 x weekly - 2 sets - 10 reps - 5 hold - Supine Gluteal Sets  - 1 x daily - 7 x weekly - 2 sets - 10 reps - 5 hold  ASSESSMENT:  CLINICAL IMPRESSION: Pt with poor tolerance d/t pain.  Continues to be limited in almost all OC and CC exercises d/t severe knee and hip pain.  Unable to tolerate any standing exercises.  Discussed options and recommend trial of aquatic therapy as I think there is a low likelihood that land based therapy will be effective.   Patient able to tolerate a bit of standing exercises today with fair tolerance.  She is limited by multiple joint pain L knee in particular.  She would like to see if she can get transportation to the pool which would be very beneficial to her.  She does well with modified mat level exercises.  She is very motivated to improve her body and her mind (is going back to school as well, voc rehab) . Patient will continue to benefit from skilled PT in order to return to PLOF and optimize functional mobility.     OBJECTIVE IMPAIRMENTS: cardiopulmonary status limiting activity, decreased activity tolerance, decreased balance, decreased endurance, decreased knowledge of condition, decreased knowledge of use of DME, decreased mobility, difficulty walking, decreased ROM, decreased strength, hypomobility, increased fascial restrictions, impaired flexibility, improper body mechanics, obesity, and pain.   ACTIVITY LIMITATIONS: carrying, lifting, bending, standing, squatting, sleeping, stairs, transfers, bed mobility, and locomotion level  PARTICIPATION LIMITATIONS: interpersonal  relationship, shopping, and community activity  PERSONAL FACTORS: Past/current experiences, Social background, Time since onset of injury/illness/exacerbation, and 3+ comorbidities: DVT, obesity, chronic issues, RA, OA  are also affecting patient's functional outcome.   REHAB POTENTIAL: Good  CLINICAL DECISION MAKING: Stable/uncomplicated  EVALUATION COMPLEXITY: Low   GOALS: Goals reviewed with patient? Yes  SHORT TERM GOALS: Target date: 12/18/2023   Patient will be able to show independence for initial HEP to include posture, core and hip strength and stability.   Baseline:unknown  Goal status:ongoing   2.  Patient will be able to complete 2 min walk test and or TUG for baseline mobility  Baseline: 22 sec TUG Goal status: MET   3.  Patient will be able to show symmetrical sit to stand x 10  Baseline: leans Rt  Goal status: ongoing   4.  Pt will no longer need to use her UE to transfer from sitting to supine  Baseline: needs UE  12/18/23: assists Left leg up on mat  Goal status: ONGOING  LONG TERM GOALS: Target date:01/15/2024    Patient will be independent with final HEP upon discharge from PT and report consistent benefit following exercise completion.    Baseline: unknown  Goal status: Ongoing  2.  Patient will be able to improve 2 min walk test distance by 50 feet or more with LRAD and no increase in pain.   Baseline: NT  Goal status: INITIAL  3.  Patient will be able to demonstrate hip/knee/ankle strength to 4/5 in order to maximize functional mobility, ambulation and lifting.   Baseline: see above  Goal status: Ongoing  4.  Pt will be able to report ability to stand for 20 min with min increase in L hip, knee pain  Baseline: 10 min or less 9/8: 15 min  Goal status: Ongoing  5.  TUG score will improve to 15 sec or less with cane  Baseline: 22 sec  Goal status: INITIAL   PLAN:  PT FREQUENCY: 2x/week  PT DURATION: 8 weeks  PLANNED INTERVENTIONS: 97164-  PT Re-evaluation, 97750- Physical Performance Testing, 97110-Therapeutic exercises, 97530- Therapeutic activity, [id number]- Neuromuscular re-education, 97535- Self Care, 02859- Manual therapy, [id number]- Gait training,  02886- Aquatic Therapy, Patient/Family education, Stair training, Taping, Joint mobilization, Cryotherapy, and Moist heat  PLAN FOR NEXT SESSION: 2 min walk test, check HEP, TUG, hip mobility as tolerated   Harlene Persons, PTA 01/05/24 3:06 PM Phone: [phone] Fax: [phone]      For all possible CPT codes, reference the Planned Interventions line above.     Check all conditions that are expected to impact treatment: {Conditions expected to impact treatment:Morbid obesity and Social determinants of health   If treatment provided at initial evaluation, no treatment charged due to lack of authorization.    Helene BRAVO Morgaine Kimball PT 01/05/24 3:06 PM Phone: [phone] Fax: [phone]

## 2024-01-07 ENCOUNTER — Encounter: Payer: Self-pay | Admitting: Physician Assistant

## 2024-01-07 ENCOUNTER — Encounter: Payer: Self-pay | Admitting: *Deleted

## 2024-01-07 ENCOUNTER — Ambulatory Visit: Admitting: Orthopaedic Surgery

## 2024-01-07 ENCOUNTER — Other Ambulatory Visit: Payer: Self-pay

## 2024-01-07 ENCOUNTER — Encounter: Payer: Self-pay | Admitting: Family

## 2024-01-07 DIAGNOSIS — M1712 Unilateral primary osteoarthritis, left knee: Secondary | ICD-10-CM

## 2024-01-07 DIAGNOSIS — G8929 Other chronic pain: Secondary | ICD-10-CM | POA: Diagnosis not present

## 2024-01-07 MED ORDER — SODIUM HYALURONATE 60 MG/3ML IX PRSY
60.0000 mg | PREFILLED_SYRINGE | INTRA_ARTICULAR | Status: AC | PRN
  Administered 2024-01-07: 60 mg via INTRA_ARTICULAR

## 2024-01-07 NOTE — Progress Notes (Signed)
   Procedure Note  Patient: Anne Brewer             Date of Birth: 1971-04-13           MRN: [id number]             Visit Date: 01/07/2024  Procedures: Visit Diagnoses:  1. Chronic pain of left knee     Large Joint Inj: L knee on 01/07/2024 9:28 AM Indications: pain Details: 22 G needle  Arthrogram: No  Medications: 60 mg Sodium Hyaluronate 60 MG/3ML Outcome: tolerated well, no immediate complications Patient was prepped and draped in the usual sterile fashion.

## 2024-01-07 NOTE — Progress Notes (Cosign Needed)
 Pt had her knee drained today, they took 50 cc off. She is interested in getting a TKR. She is supposed to get down to 250 lbs to have the surgery.  She is supposed to take it easy today, after the fluid removal.  She has a gastric sleeve, but would benefit from GLP-1.   She requests pain patches, says the Lidocaine  roll-on helps a lot. She was given another one, a box of Lido patches and Tylenol . She also got incontinence pads.  She is working on getting a pain clinic referral thru her PCP.  She is working on getting her disability restarted.   Shona Shad, PA-C 01/07/2024 2:47 PM

## 2024-01-07 NOTE — Congregational Nurse Program (Signed)
  Dept: [phone]   Congregational Nurse Program Note  Date of Encounter: 01/07/2024  Past Medical History: Past Medical History:  Diagnosis Date   Anemia    Arthritis     Encounter Details:  Community Questionnaire - 01/07/24 1218       Questionnaire   Ask client: Do you give verbal consent for me to treat you today? Yes    Student Assistance N/A    Location Patient Served  [facility]    Encounter Setting CN site    Population Status Unhoused    Insurance Medicaid    Insurance/Financial Assistance Referral N/A    Medication N/A    Medical Provider Yes    Screening Referrals Made N/A    Medical Referrals Made [facility] P[facility]inic    Medical Appointment Completed N/A    CNP Interventions Advocate/Support    Screenings CN Performed Blood Pressure    ED Visit Averted N/A    Life-Saving Intervention Made N/A        Client requested to see MD in [facility] clinic this afternoon following Ortho visit to discuss medications. Completed triage form. Blood pressure (!) 118/56, pulse 88, last menstrual period 10/06/2016, SpO2 95%. Offered support and encouragement. Niels Cranshaw W RN CN

## 2024-01-09 ENCOUNTER — Other Ambulatory Visit (HOSPITAL_COMMUNITY): Payer: Self-pay

## 2024-01-12 ENCOUNTER — Other Ambulatory Visit (HOSPITAL_COMMUNITY): Payer: Self-pay

## 2024-01-14 ENCOUNTER — Encounter: Payer: Self-pay | Admitting: Physician Assistant

## 2024-01-14 NOTE — Progress Notes (Signed)
 Pt concerned about her insurance.  She has a [employer] card but has been changed to [facility], unknown by whom.  Lolita is her case manager here.  Pain management has contacted her and she has an appt.   She is working thru [facility] to get transportation.   She is using the roll-on for pain control, cannot use both that and the Lido patch.   Sec 8 housing did not go through, she says she may have to get a job to pay for housing.    Shona Shad, PA-C 01/14/2024 5:22 PM

## 2024-01-19 ENCOUNTER — Other Ambulatory Visit: Payer: Self-pay | Admitting: Family

## 2024-01-19 ENCOUNTER — Ambulatory Visit: Payer: Self-pay

## 2024-01-19 DIAGNOSIS — G8929 Other chronic pain: Secondary | ICD-10-CM

## 2024-01-19 NOTE — Telephone Encounter (Signed)
 FYI Only or Action Required?: Action required by provider: request for appointment.  Patient was last seen in primary care on 10/24/2023 by Lorren Greig PARAS, NP.  Called Nurse Triage reporting Leg Pain.  Symptoms began several months ago.  Interventions attempted: OTC medications: tylenol .  Symptoms are: gradually worsening.  Triage Disposition: See HCP Within 4 Hours (Or PCP Triage)  Patient/caregiver understands and will follow disposition?: UnsureCopied from CRM #[id number]. Topic: Clinical - Red Word Triage >> Jan 19, 2024 10:23 AM Pinkey ORN wrote: Red Word that prompted transfer to Nurse Triage: Worsening Pain (Left Leg + Knee) Reason for Disposition  [1] SEVERE pain (e.g., excruciating, unable to do any normal activities) AND [2] not improved after 2 hours of pain medicine  Answer Assessment - Initial Assessment Questions Pain medication/patches are longer working. Pt declined appt today due to transportation. Pt requested appt after 10/3. Pt will see NP Theotis on 10/8 at [facility].      1. ONSET: When did the pain start?      July  2. LOCATION: Where is the pain located?      Left leg 3. PAIN: How bad is the pain?    (Scale 1-10; or mild, moderate, severe)     9 4. WORK OR EXERCISE: Has there been any recent work or exercise that involved this part of the body?      na 5. CAUSE: What do you think is causing the leg pain?     unsure 6. OTHER SYMPTOMS: Do you have any other symptoms? (e.g., chest pain, back pain, breathing difficulty, swelling, rash, fever, numbness, weakness)     Back pain  Protocols used: Leg Pain-A-AH

## 2024-01-19 NOTE — Telephone Encounter (Signed)
 Copied from CRM #[id number]. Topic: Clinical - Medication Refill >> Jan 19, 2024 10:24 AM Pinkey ORN wrote: Medication: lidocaine  4 %  Has the patient contacted their pharmacy? Yes (Agent: If no, request that the patient contact the pharmacy for the refill. If patient does not wish to contact the pharmacy document the reason why and proceed with request.) (Agent: If yes, when and what did the pharmacy advise?)  This is the patient's preferred pharmacy:  [facility] [address] [place] [address] Phone: [phone] Fax: [phone]  Is this the correct pharmacy for this prescription? Yes If no, delete pharmacy and type the correct one.   Has the prescription been filled recently? No  Is the patient out of the medication? Yes  Has the patient been seen for an appointment in the last year OR does the patient have an upcoming appointment? Yes  Can we respond through MyChart? No  Agent: Please be advised that Rx refills may take up to 3 business days. We ask that you follow-up with your pharmacy.

## 2024-01-19 NOTE — Telephone Encounter (Signed)
 Report to Emergency Department/Urgent Care/call 911 for immediate medical evaluation. Follow-up with Primary Care.

## 2024-01-20 ENCOUNTER — Other Ambulatory Visit (HOSPITAL_COMMUNITY): Payer: Self-pay

## 2024-01-20 ENCOUNTER — Other Ambulatory Visit: Payer: Self-pay

## 2024-01-20 MED ORDER — LIDOCAINE 4 % EX PTCH: 1.0000 | MEDICATED_PATCH | CUTANEOUS | 2 refills | Status: DC

## 2024-01-20 NOTE — Telephone Encounter (Signed)
 Complete

## 2024-01-20 NOTE — Telephone Encounter (Signed)
 Requested medication (s) are due for refill today: yes  Requested medication (s) are on the active medication list: yes  Last refill:  10/24/23  Future visit scheduled: yes  Notes to clinic:  Medication not assigned to a protocol, review manually.      Requested Prescriptions  Pending Prescriptions Disp Refills   lidocaine  4 % 30 patch 2    Sig: Place 1 patch onto the skin daily.     Off-Protocol Failed - 01/20/2024 11:40 AM      Failed - Medication not assigned to a protocol, review manually.      Passed - Valid encounter within last 12 months    Recent Outpatient Visits           2 months ago Annual physical exam   [place] [facility], Amy J, NP   4 months ago Rheumatoid arthritis involving multiple sites, unspecified whether rheumatoid factor present [facility])   [place] [facility], Amy J, NP   5 months ago Rheumatoid arthritis involving multiple sites, unspecified whether rheumatoid factor present [facility])   [place] [facility] at [facility], [place], NP   6 months ago Encounter to establish care   [facility] at [facility], [place] J, NP             Analgesics:  Topicals Failed - 01/20/2024 11:40 AM      Failed - Manual Review: Labs are only required if the patient has taken medication for more than 8 weeks.      Failed - HCT in normal range and within 360 days    Hematocrit  Date Value Ref Range Status  10/24/2023 33.4 (L) 34.0 - 46.6 % Final         Passed - PLT in normal range and within 360 days    Platelets  Date Value Ref Range Status  10/24/2023 259 150 - 450 x10E3/uL Final         Passed - HGB in normal range and within 360 days    Hemoglobin  Date Value Ref Range Status  10/24/2023 11.1 11.1 - 15.9 g/dL Final         Passed - Cr in normal range and within 360 days    Creatinine, Ser  Date Value Ref Range Status  10/24/2023 0.83 0.57 - 1.00 mg/dL  Final         Passed - eGFR is 30 or above and within 360 days    GFR calc Af Amer  Date Value Ref Range Status  01/07/2017 >60 >60 mL/min Final    Comment:    (NOTE) The eGFR has been calculated using the CKD EPI equation. This calculation has not been validated in all clinical situations. eGFR's persistently <60 mL/min signify possible Chronic Kidney Disease.    GFR, Estimated  Date Value Ref Range Status  09/01/2023 >60 >60 mL/min Final    Comment:    (NOTE) Calculated using the CKD-EPI Creatinine Equation (2021)    eGFR  Date Value Ref Range Status  10/24/2023 84 >59 mL/min/1.73 Final         Passed - Patient is not pregnant      Passed - Valid encounter within last 12 months    Recent Outpatient Visits           2 months ago Annual physical exam   [facility], Greig PARAS, NP  4 months ago Rheumatoid arthritis involving multiple sites, unspecified whether rheumatoid factor present [facility])   [place] [facility], Amy J, NP   5 months ago Rheumatoid arthritis involving multiple sites, unspecified whether rheumatoid factor present [facility])   [place] [facility], [place], NP   6 months ago Encounter to establish care   [facility] at [facility], Greig PARAS, NP

## 2024-01-20 NOTE — Telephone Encounter (Signed)
 I called patient with pcp recommendations no one answered so I left a voicemail to return my call.

## 2024-01-21 ENCOUNTER — Other Ambulatory Visit (HOSPITAL_COMMUNITY): Payer: Self-pay

## 2024-01-21 ENCOUNTER — Encounter: Payer: Self-pay | Admitting: Physician Assistant

## 2024-01-21 ENCOUNTER — Ambulatory Visit: Admitting: Physical Therapy

## 2024-01-21 NOTE — Therapy (Deleted)
 OUTPATIENT PHYSICAL THERAPY LOWER EXTREMITY TREATMENT    Patient Name: Anne Brewer MRN: [id number] DOB:[birth year], 53 y.o., female Today's Date: 01/21/2024  END OF SESSION:      Past Medical History:  Diagnosis Date   Anemia    Arthritis    Past Surgical History:  Procedure Laterality Date   ABDOMINAL SURGERY     CESAREAN SECTION     CHOLECYSTECTOMY     LAPAROSCOPIC GASTRIC SLEEVE RESECTION  2010   TOTAL KNEE ARTHROPLASTY Right 07/12/2015   Procedure: TOTAL KNEE ARTHROPLASTY;  Surgeon: Toribio JULIANNA [name], MD;  Location: [facility] OR;  Service: Orthopedics;  Laterality: Right;   Patient Active Problem List   Diagnosis Date Noted   Unspecified mood (affective) disorder 09/02/2023   Cannabis abuse 09/02/2023   Abnormal uterine bleeding (AUB) 09/11/2016   Uterine fibroid 09/11/2016   S/P total knee replacement 07/12/2015    PCP: Lorren No NP  REFERRING PROVIDER: Brien Belvie BRAVO, MD  REFERRING DIAG:  [phone] (ICD-10-CM) - Chronic pain of left knee  M25.552,G89.29 (ICD-10-CM) - Chronic left hip pain    THERAPY DIAG:  No diagnosis found.  Rationale for Evaluation and Treatment: Rehabilitation  ONSET DATE: 2019  SUBJECTIVE:   SUBJECTIVE STATEMENT: ***   PERTINENT HISTORY: OA and RA, Rt TKA , sees Vascular for DVT  PAIN:  Are you having pain? Yes: NPRS scale: 6/10-7/10 Pain location: L hip and L knee   Pain description: tight, swelling  Aggravating factors: cold, AM, activity  Relieving factors: pain patch, resting/sitting, elevation    PRECAUTIONS: Other: DVT   RED FLAGS: None   WEIGHT BEARING RESTRICTIONS: No  FALLS:  Has patient fallen in last 6 months? No  LIVING ENVIRONMENT: Lives with: is homeless Lives in: Transitional housing Stairs: No takes elevator  Has following equipment at home: Environmental consultant - 4 wheeledcane   OCCUPATION: not working, was Music therapist  PLOF: Independent with basic ADLs, Independent with community mobility  with device, Vocation/Vocational requirements: not working , and Leisure: limited due to housing status   PATIENT GOALS: I want to get back to the gym and lose 100 lbs   NEXT MD VISIT: not sure   OBJECTIVE:  Note: Objective measures were completed at Evaluation unless otherwise noted.  DIAGNOSTIC FINDINGS: none for hip  L knee and L ankle   EXAM: LEFT KNEE - COMPLETE 4+ VIEW   COMPARISON:  None Available.   FINDINGS: Tricompartmental joint space narrowing and peripheral spurring. No fracture. No erosion or focal bone abnormality. There is a moderate joint effusion. Generalized soft tissue edema.   IMPRESSION: 1. Tricompartmental osteoarthritis. 2. Moderate joint effusion.    EXAM: LEFT FOOT - COMPLETE 3+ VIEW   COMPARISON:  11/21/2014   FINDINGS: There is no evidence of fracture or dislocation. Minor degenerative change of the first metatarsal phalangeal joint. There is chronic spurring about the dorsal talonavicular. No erosive change. Mild soft tissue edema.   IMPRESSION: 1. Mild soft tissue edema. No acute osseous abnormality. 2. Chronic talonavicular spurring. Minor degenerative change of the first metatarsophalangeal joint.  PATIENT SURVEYS:  LEFS   Score total:  50/80     COGNITION: Overall cognitive status: Within functional limits for tasks assessed     SENSATION: WFL  EDEMA:  NT but evident in L knee and ankle   MUSCLE LENGTH: Unable to measure   POSTURE: rounded shoulders, forward head, increased thoracic kyphosis, posterior pelvic tilt, and weight shift right  PALPATION: Pain with palpation to ant L  hip, lateral and post L hip.    LOWER EXTREMITY ROM:  Passive ROM Right eval Left eval  Hip flexion 90 45  Hip extension    Hip abduction    Hip adduction    Hip internal rotation 20 p 0  Hip external rotation 45 20  Knee flexion 80 75  Knee extension  hyper  Ankle dorsiflexion    Ankle plantarflexion    Ankle inversion    Ankle  eversion     (Blank rows = not tested)  LOWER EXTREMITY MMT:  MMT Right eval Left eval  Hip flexion 3 3-  Hip extension    Hip abduction  2  Hip adduction    Hip internal rotation    Hip external rotation    Knee flexion 5 4  Knee extension 5 3+  Ankle dorsiflexion 5 4  Ankle plantarflexion    Ankle inversion    Ankle eversion     (Blank rows = not tested)  LOWER EXTREMITY SPECIAL TESTS:  Hip special tests: Belvie (FABER) test: positive  and did not tolerate   FUNCTIONAL TESTS:  5 times sit to stand: 27 sec  30 seconds chair stand test Timed up and go (TUG): Nt on eval   GAIT: Distance walked: 125 Assistive device utilized: Walker - 4 wheeled Level of assistance: Modified independence Comments: safe with walker                                                                                                            TREATMENT DATE:   TREATMENT 01/21/24:  Aquatic therapy at [facility]- [address] - therapeutic pool temp 92 degrees Pt enters building independently.  Treatment took place in water 3.8 to  4 ft 8 in.feet deep depending upon activity.  Pt entered and exited the pool via stair and handrails    Aquatic Therapy:  Water walking for warm up fwd/lat/bkwds  ***  Pt requires the buoyancy of water for active assisted exercises with buoyancy supported for strengthening and AROM exercises. Hydrostatic pressure also supports joints by unweighting joint load by at least 50 % in 3-4 feet depth water. 80% in chest to neck deep water. Water will provide assistance with movement using the current and laminar flow while the buoyancy reduces weight bearing. Pt requires the viscosity of the water for resistance with strengthening exercises.  [facility] Adult PT Treatment:                                                DATE: 01/05/24 Therapeutic Exercise: LAQ - 3x10 Bridge with feet on tall bolster - 4x5 SLR with assist Bosu quad set - 2x10 Seated ball squeeze   Seated clam - Black TB - 2x10 Seated left heel slide with cloth  Seated march assited on left with strap x 10 each   Therapeutic Activity  Standing heel raise - stopped d/t pain  [facility] Adult PT Treatment:                                                DATE: 12/11/23 Therapeutic Exercise: Sit to stand x 5 Pain Wall for partial sit 10 sec hold x 10 Standing hip abduction  x 10  Heel raises x 15  Seated LAQ x 20 Pain on L  Seated hamstring curl GTB x  15  Heel slide with AAROM LLE  Bridging legs on the bolster x 15 SLR Rt LE  x 10  from bolster PROM L hip  Self Care:  Knee OA and hip OA  Aquatics    [facility] Adult PT Treatment:                                                DATE: 12/09/23 Therapeutic Exercise: NuStep L4 UE and LE x 5 min  Seated knee extension red band  x 15 each  Hamstring curl red band PT anchoring  Quad set  x 15, sec  Glute set x 15 , 5 sec  March (hip fle previously xion) unable  Clam green band x 15 bilateral LEs Banded bridge  x 10  Ther act.  Sit to stand x 10  Standing hip flexor stretch TUG x 3 (with cane) 27 sec, 22 sec, 20 sec  Self care: Home  Went over x-rays with patient, left knee.  Discussed knee hyperextension that she shows in standing and with walking.  Discussed the difference between rheumatoid arthritis and osteoarthritis.  Discussed the likely need for her to have a total knee replacement    [facility] Adult PT Treatment:                                                DATE: 11/20/23 Therapeutic Exercise: Quad set Glute set  Ant hip stretch in standing Sit to stand  Self Care: HEP, aquatics, POC, differential diagnosis    Avoiding compensations    PATIENT EDUCATION:  Education details: see above  Person educated: Patient Education method: Explanation Education comprehension: verbalized understanding, returned demonstration, and needs further education  HOME EXERCISE PROGRAM: Access Code: [email or web address] URL:  [email or web address] Date: 11/20/2023 Prepared by: Delon Norma  Exercises - Standing Hip Flexor Stretch  - 1 x daily - 7 x weekly - 1 sets - 5 reps - 30 hold - Supine Lower Trunk Rotation  - 1 x daily - 7 x weekly - 2 sets - 10 reps - 10 hold - Sit to Stand Without Arm Support  - 1 x daily - 7 x weekly - 2 sets - 10 reps - 5 hold - Supine Quad Set  - 1 x daily - 7 x weekly - 2 sets - 10 reps - 5 hold - Supine Gluteal Sets  - 1 x daily - 7 x weekly - 2 sets - 10 reps - 5 hold  ASSESSMENT:  CLINICAL IMPRESSION:  Session today focused on *** in the aquatic environment for use of buoyancy to offload joints and the viscosity of water as resistance during therapeutic exercise.  ***.  Patient was able to tolerate all prescribed exercises in the aquatic environment with no adverse effects and reports ***/10 pain at the end of the session. Patient continues to benefit from skilled PT services on land and aquatic based and should be progressed as able to improve functional independence.    Patient able to tolerate a bit of standing exercises today with fair tolerance.  She is limited by multiple joint pain L knee in particular.  She would like to see if she can get transportation to the pool which would be very beneficial to her.  She does well with modified mat level exercises.  She is very motivated to improve her body and her mind (is going back to school as well, voc rehab) . Patient will continue to benefit from skilled PT in order to return to PLOF and optimize functional mobility.     OBJECTIVE IMPAIRMENTS: cardiopulmonary status limiting activity, decreased activity tolerance, decreased balance, decreased endurance, decreased knowledge of condition, decreased knowledge of use of DME, decreased mobility, difficulty walking, decreased ROM, decreased strength, hypomobility, increased fascial restrictions, impaired flexibility, improper body mechanics, obesity, and pain.   ACTIVITY  LIMITATIONS: carrying, lifting, bending, standing, squatting, sleeping, stairs, transfers, bed mobility, and locomotion level  PARTICIPATION LIMITATIONS: interpersonal relationship, shopping, and community activity  PERSONAL FACTORS: Past/current experiences, Social background, Time since onset of injury/illness/exacerbation, and 3+ comorbidities: DVT, obesity, chronic issues, RA, OA  are also affecting patient's functional outcome.   REHAB POTENTIAL: Good  CLINICAL DECISION MAKING: Stable/uncomplicated  EVALUATION COMPLEXITY: Low   GOALS: Goals reviewed with patient? Yes  SHORT TERM GOALS: Target date: 12/18/2023   Patient will be able to show independence for initial HEP to include posture, core and hip strength and stability.   Baseline:unknown  Goal status:ongoing   2.  Patient will be able to complete 2 min walk test and or TUG for baseline mobility  Baseline: 22 sec TUG Goal status: MET   3.  Patient will be able to show symmetrical sit to stand x 10  Baseline: leans Rt  Goal status: ongoing   4.  Pt will no longer need to use her UE to transfer from sitting to supine  Baseline: needs UE  12/18/23: assists Left leg up on mat  Goal status: ONGOING  LONG TERM GOALS: Target date:01/15/2024    Patient will be independent with final HEP upon discharge from PT and report consistent benefit following exercise completion.    Baseline: unknown  Goal status: Ongoing  2.  Patient will be able to improve 2 min walk test distance by 50 feet or more with LRAD and no increase in pain.   Baseline: NT  Goal status: INITIAL  3.  Patient will be able to demonstrate hip/knee/ankle strength to 4/5 in order to maximize functional mobility, ambulation and lifting.   Baseline: see above  Goal status: Ongoing  4.  Pt will be able to report ability to stand for 20 min with min increase in L hip, knee pain  Baseline: 10 min or less 9/8: 15 min  Goal status: Ongoing  5.  TUG score will  improve to 15 sec or less with cane  Baseline: 22 sec  Goal status: INITIAL   PLAN:  PT FREQUENCY: 2x/week  PT DURATION: 8 weeks  PLANNED INTERVENTIONS: 97164- PT Re-evaluation, 97750- Physical Performance Testing, 97110-Therapeutic exercises, 97530- Therapeutic activity, [id number]- Neuromuscular re-education, 97535- Self Care, [id number]- Manual therapy, [phone]- Gait training, [phone]- Aquatic Therapy, Patient/Family education, Stair training, Taping, Joint  mobilization, Cryotherapy, and Moist heat  PLAN FOR NEXT SESSION: 2 min walk test, check HEP, TUG, hip mobility as tolerated   Helene FORBES Gasmen PT 01/21/24 11:59 AM Phone: [phone] Fax: [phone]

## 2024-01-21 NOTE — Progress Notes (Signed)
 Pt has appt w/ PCP on 10/08, to ask about referral to a pain clinic.   She has been taking acetaminophen  500 mg x 3 as needed, but she needs it 3 x day. That is 4500 mg per day.  She was asked to limit the tabs to 2 per dose, total 1000 mg.   She is still using Lido patches on her hip. Got relief from a shot for several months.  She was asked to contact Dr Jerri to schedule another shot.   She was also given a pack of continence pads.   No other issues or concerns.   Shona Shad, PA-C 01/21/2024 4:37 PM'

## 2024-01-21 NOTE — Telephone Encounter (Signed)
 Patient calling in as prescription was sent to the wrong pharmacy. Patient had requested for prescription to [facility] [address] Patient will pick up lidocaine  patches from [facility] this time but is requesting for all prescription to be sent to [facility] pharmacy [address] going forward.

## 2024-01-22 ENCOUNTER — Encounter: Payer: Self-pay | Admitting: Physician Assistant

## 2024-01-22 NOTE — Progress Notes (Signed)
 Pt given Tylenol  and continence pads yesterday.  Today, c/o cough, productive of clear mucus and nasal stuffiness.   Pt given cough drops and Zyrtec .  Still c/o pain, just put a patch on. Hopefully it will kick in soon.   Shona Shad, PA-C 01/22/2024 10:23 AM

## 2024-01-28 ENCOUNTER — Other Ambulatory Visit (HOSPITAL_COMMUNITY): Payer: Self-pay

## 2024-01-28 DIAGNOSIS — M1712 Unilateral primary osteoarthritis, left knee: Secondary | ICD-10-CM | POA: Diagnosis not present

## 2024-01-28 DIAGNOSIS — M792 Neuralgia and neuritis, unspecified: Secondary | ICD-10-CM | POA: Diagnosis not present

## 2024-01-28 MED ORDER — DULOXETINE HCL 30 MG PO CPEP
60.0000 mg | ORAL_CAPSULE | Freq: Every day | ORAL | 1 refills | Status: DC
  Filled 2024-01-28 (×2): qty 60, 30d supply, fill #0

## 2024-01-28 MED ORDER — PREGABALIN 50 MG PO CAPS
50.0000 mg | ORAL_CAPSULE | Freq: Three times a day (TID) | ORAL | 1 refills | Status: DC
  Filled 2024-02-02: qty 90, 30d supply, fill #0
  Filled 2024-03-15 – 2024-03-22 (×2): qty 90, 30d supply, fill #1

## 2024-01-29 ENCOUNTER — Other Ambulatory Visit (HOSPITAL_COMMUNITY): Payer: Self-pay

## 2024-01-29 MED ORDER — LIDOCAINE 5 % EX PTCH
1.0000 | MEDICATED_PATCH | Freq: Every day | CUTANEOUS | 0 refills | Status: DC
  Filled 2024-01-29: qty 30, 30d supply, fill #0

## 2024-01-30 ENCOUNTER — Other Ambulatory Visit (HOSPITAL_COMMUNITY): Payer: Self-pay

## 2024-01-30 ENCOUNTER — Other Ambulatory Visit: Payer: Self-pay | Admitting: Physician Assistant

## 2024-01-30 DIAGNOSIS — M069 Rheumatoid arthritis, unspecified: Secondary | ICD-10-CM

## 2024-02-02 ENCOUNTER — Other Ambulatory Visit (HOSPITAL_COMMUNITY): Payer: Self-pay

## 2024-02-04 ENCOUNTER — Ambulatory Visit: Payer: Self-pay | Attending: Nurse Practitioner | Admitting: Nurse Practitioner

## 2024-02-04 ENCOUNTER — Encounter: Payer: Self-pay | Admitting: Nurse Practitioner

## 2024-02-04 ENCOUNTER — Other Ambulatory Visit (HOSPITAL_COMMUNITY): Payer: Self-pay

## 2024-02-04 VITALS — BP 134/75 | HR 56 | Resp 20 | Ht 67.0 in | Wt 297.4 lb

## 2024-02-04 DIAGNOSIS — M0579 Rheumatoid arthritis with rheumatoid factor of multiple sites without organ or systems involvement: Secondary | ICD-10-CM | POA: Diagnosis not present

## 2024-02-04 DIAGNOSIS — M1990 Unspecified osteoarthritis, unspecified site: Secondary | ICD-10-CM

## 2024-02-04 DIAGNOSIS — Z23 Encounter for immunization: Secondary | ICD-10-CM | POA: Diagnosis not present

## 2024-02-04 DIAGNOSIS — G894 Chronic pain syndrome: Secondary | ICD-10-CM

## 2024-02-04 DIAGNOSIS — D508 Other iron deficiency anemias: Secondary | ICD-10-CM

## 2024-02-04 DIAGNOSIS — M069 Rheumatoid arthritis, unspecified: Secondary | ICD-10-CM

## 2024-02-04 MED ORDER — FERROUS SULFATE 324 (65 FE) MG PO TBEC
324.0000 mg | DELAYED_RELEASE_TABLET | Freq: Two times a day (BID) | ORAL | 0 refills | Status: DC
  Filled 2024-02-04: qty 60, 30d supply, fill #0

## 2024-02-04 MED ORDER — CELECOXIB 200 MG PO CAPS
200.0000 mg | ORAL_CAPSULE | Freq: Two times a day (BID) | ORAL | 0 refills | Status: DC
  Filled 2024-02-04: qty 60, 30d supply, fill #0

## 2024-02-04 MED ORDER — METHOTREXATE SODIUM 2.5 MG PO TABS
25.0000 mg | ORAL_TABLET | ORAL | 0 refills | Status: DC
  Filled 2024-02-04: qty 40, 28d supply, fill #0

## 2024-02-04 MED ORDER — SULFASALAZINE 500 MG PO TBEC
500.0000 mg | DELAYED_RELEASE_TABLET | Freq: Two times a day (BID) | ORAL | 0 refills | Status: DC
  Filled 2024-02-04: qty 60, 30d supply, fill #0

## 2024-02-04 NOTE — Progress Notes (Signed)
 Referral to rheumatologist , needs office closer to our office.
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
## 2024-02-05 ENCOUNTER — Encounter: Payer: Self-pay | Admitting: Internal Medicine

## 2024-02-05 ENCOUNTER — Other Ambulatory Visit: Payer: Self-pay

## 2024-02-05 NOTE — Progress Notes (Signed)
 53 year old PMH RA, she saw her PCP yesterday, received flu vaccine. She report feeling tired and ache. No fever, no cough. Also complaining of her chronic hip , knee pain. She is requesting tylenol  and pain patches. She will received refill from her PCP for her RA meds. She has an appointment in January with Rheumatologist.   -Vitals: BP 130/81   P: 78   Oxygen Sat: 97 RA.  General; Alert, in no distress.  Lungs: CTA.   Plan:  Provided tylenol  for pain: advised to take 1 or 2 tablet 500mg  every 8 hours as needed for pain.

## 2024-02-10 ENCOUNTER — Other Ambulatory Visit (HOSPITAL_COMMUNITY): Payer: Self-pay

## 2024-02-11 ENCOUNTER — Encounter: Payer: Self-pay | Admitting: Physician Assistant

## 2024-02-11 LAB — AMB RESULTS CONSOLE CBG: Glucose: 103

## 2024-02-11 LAB — HEMOGLOBIN A1C: Hemoglobin A1C: 5.7

## 2024-02-11 NOTE — Progress Notes (Signed)
 Pt has PCP. Resources given for SDOH needs. Please clarify Transportation needs.

## 2024-02-11 NOTE — Progress Notes (Unsigned)
 Pt pain being managed w/ 5% Lidocaine  patches and Cymbalta. She needs 2 patches/day >> wants to let the pain clinic know, was given Dr Bintrim's number and address.   She missed an appt w/ Rheumatology on [address]. It is most likely Dr Curt at [facility], she was given contact information for him.   She went to a job fair today, hopes to get a job from this.

## 2024-02-18 ENCOUNTER — Encounter: Payer: Self-pay | Admitting: Physician Assistant

## 2024-02-18 ENCOUNTER — Other Ambulatory Visit (HOSPITAL_COMMUNITY): Payer: Self-pay

## 2024-02-18 ENCOUNTER — Other Ambulatory Visit: Payer: Self-pay | Admitting: Family Medicine

## 2024-02-18 MED ORDER — LIDOCAINE 5 % EX PTCH
2.0000 | MEDICATED_PATCH | Freq: Every day | CUTANEOUS | 0 refills | Status: DC
  Filled 2024-02-18 – 2024-02-19 (×3): qty 60, 30d supply, fill #0

## 2024-02-18 NOTE — Progress Notes (Addendum)
 [employer]  S: 53 yo with hx gastric bypass, obesity, RA, chronic pain here with concerns regarding her chronic LLE pain.  She's been using 2 lidocaine  patches which has been helping.  Asking for refills.    O: There were no vitals filed for this visit. General: No acute distress.  Leaning on exam table.  Lungs: unlabored Neurological: Alert and oriented 3. Moves all extremities 4.  Walking with a cane.  A/P: Chronic pain - has appt with pain clinic upcoming.  She ran out of lidocaine  patches because she was taking 2 at a time.  Will refill to bridge her to pain clinic.  Meliton Monte, MD  -------------  tPt has to use 2 Lido patches a day to manage the pain. She also takes Cymbalta, 2 at a time.  Dr Monte explained that it takes about a month to reach full affect. She is also taking Celebrex  and Lyrica .   She called the pain clinic, has appt in November.   Currenly has only 1 patch on, pain is about a 6/10.   She wonders if she needs Vicodin or something stronger.   Pt cold is much better.   She says the Rheumatologist in [place] told her she had severe RA, gave her an injection which helped.  Recent injections in her knee and ankle helped some. She walks better now.   The worst pain now is in her L hip since her knee and ankle are better.   Dr Monte sent in her 5% Lidocaine  patches, to bridge her to her Pain Clinic appt. She was given small Salon Pas patches as supplement therapy.  Will route this note to the provider that she will see on 11/10, Sherlean Jenkins New, PAC.   Shona Shad, PA-C 02/18/2024 3:24 PM

## 2024-02-19 ENCOUNTER — Other Ambulatory Visit: Payer: Self-pay

## 2024-02-19 ENCOUNTER — Other Ambulatory Visit (HOSPITAL_COMMUNITY): Payer: Self-pay

## 2024-02-19 ENCOUNTER — Encounter: Payer: Self-pay | Admitting: *Deleted

## 2024-02-19 NOTE — Congregational Nurse Program (Signed)
  Dept: [phone]   Congregational Nurse Program Note  Date of Encounter: 02/19/2024  Past Medical History: Past Medical History:  Diagnosis Date   Anemia    Arthritis     Encounter Details:  Community Questionnaire - 02/19/24 1355       Questionnaire   Ask client: Do you give verbal consent for me to treat you today? Yes    Student Assistance N/A    Location Patient Served  [facility]    Encounter Setting CN site    Population Status Unhoused    Insurance Medicaid    Insurance/Financial Assistance Referral N/A    Medication Have Medication Insecurities;Provided Medication Assistance    Medical Provider Yes    Screening Referrals Made N/A    Medical Referrals Made N/A    Medical Appointment Completed N/A    CNP Interventions Advocate/Support;Case Management    Screenings CN Performed N/A    ED Visit Averted N/A    Life-Saving Intervention Made N/A         Received message from MD to pick up medication for client. Lidocaine  patches picked up from [facility] pharmacy and brought to [facility]. Client seen outside of [facility] and patches were given to her. Therasa Lorenzi W RN CN

## 2024-02-20 ENCOUNTER — Other Ambulatory Visit (HOSPITAL_COMMUNITY): Payer: Self-pay

## 2024-02-20 MED ORDER — CELECOXIB 200 MG PO CAPS
200.0000 mg | ORAL_CAPSULE | Freq: Every day | ORAL | 0 refills | Status: DC | PRN
  Filled 2024-02-20: qty 30, 30d supply, fill #0

## 2024-02-20 MED ORDER — SULFASALAZINE 500 MG PO TABS
500.0000 mg | ORAL_TABLET | Freq: Two times a day (BID) | ORAL | 0 refills | Status: DC
  Filled 2024-02-20: qty 60, 30d supply, fill #0

## 2024-02-20 MED ORDER — METHOTREXATE SODIUM 2.5 MG PO TABS
25.0000 mg | ORAL_TABLET | ORAL | 0 refills | Status: DC
  Filled 2024-02-20: qty 40, 28d supply, fill #0

## 2024-02-23 ENCOUNTER — Other Ambulatory Visit (HOSPITAL_BASED_OUTPATIENT_CLINIC_OR_DEPARTMENT_OTHER): Payer: Self-pay

## 2024-02-25 ENCOUNTER — Encounter: Payer: Self-pay | Admitting: Physician Assistant

## 2024-02-25 ENCOUNTER — Other Ambulatory Visit (HOSPITAL_COMMUNITY): Payer: Self-pay

## 2024-02-25 ENCOUNTER — Other Ambulatory Visit: Payer: Self-pay

## 2024-02-25 ENCOUNTER — Encounter: Payer: Self-pay | Admitting: *Deleted

## 2024-02-25 ENCOUNTER — Other Ambulatory Visit: Payer: Self-pay | Admitting: Critical Care Medicine

## 2024-02-25 DIAGNOSIS — G4700 Insomnia, unspecified: Secondary | ICD-10-CM

## 2024-02-25 MED ORDER — CICLOPIROX 8 % EX SOLN
CUTANEOUS | 3 refills | Status: AC
  Filled 2024-02-25 – 2024-02-26 (×3): qty 6.6, 30d supply, fill #0

## 2024-02-25 MED ORDER — TRAZODONE HCL 50 MG PO TABS
25.0000 mg | ORAL_TABLET | Freq: Every evening | ORAL | 0 refills | Status: DC | PRN
  Filled 2024-02-25: qty 90, 90d supply, fill #0

## 2024-02-25 MED ORDER — SULFASALAZINE 500 MG PO TABS: 500.0000 mg | ORAL_TABLET | Freq: Two times a day (BID) | ORAL | 0 refills | Status: AC

## 2024-02-25 MED ORDER — METHOTREXATE SODIUM 2.5 MG PO TABS
25.0000 mg | ORAL_TABLET | ORAL | 0 refills | Status: DC
  Filled 2024-02-25 – 2024-03-03 (×2): qty 40, 28d supply, fill #0

## 2024-02-25 NOTE — Progress Notes (Signed)
 Pt here because  of her feet.   She is concerned about them.  There are good distal pulses w/ nl cap refill in all toes.   The toenails are long, tendency to curve on 4 th toes bilat, endangering skin on 3rd toes.  Fungal damage apparent in mult toes, worse on the big toes bilat.  Shoes and socks are in good shape.  Repeat visit to Foot MD recommended, pt given name and phone number.  Her pain is under better control, she is walking better.  Shona Shad, PA-C 02/25/2024 1:53 PM

## 2024-02-25 NOTE — Progress Notes (Signed)
 Refills

## 2024-02-25 NOTE — Congregational Nurse Program (Signed)
  Dept: [phone]   Congregational Nurse Program Note  Date of Encounter: 02/25/2024  Past Medical History: Past Medical History:  Diagnosis Date   Anemia    Arthritis     Encounter Details:  Community Questionnaire - 02/25/24 1237       Questionnaire   Ask client: Do you give verbal consent for me to treat you today? Yes    Student Assistance N/A    Location Patient Served  [facility]    Encounter Setting CN site    Population Status Unhoused    Insurance Medicare    Insurance/Financial Assistance Referral N/A    Medication N/A    Medical Provider Yes    Screening Referrals Made N/A    Medical Referrals Made [facility] PCP[facility]inic    Medical Appointment Completed N/A    CNP Interventions Advocate/Support    Screenings CN Performed Blood Pressure    ED Visit Averted N/A    Life-Saving Intervention Made N/A         Client assigned to see Dr in [facility] clinic today. Completed triage form. Blood pressure (!) 135/96, pulse 81, last menstrual period 10/06/2016, SpO2 97%.

## 2024-02-26 ENCOUNTER — Other Ambulatory Visit: Payer: Self-pay

## 2024-02-26 ENCOUNTER — Other Ambulatory Visit (HOSPITAL_BASED_OUTPATIENT_CLINIC_OR_DEPARTMENT_OTHER): Payer: Self-pay

## 2024-02-26 ENCOUNTER — Encounter: Payer: Self-pay | Admitting: Podiatry

## 2024-02-26 ENCOUNTER — Other Ambulatory Visit (HOSPITAL_COMMUNITY): Payer: Self-pay

## 2024-02-26 ENCOUNTER — Ambulatory Visit (INDEPENDENT_AMBULATORY_CARE_PROVIDER_SITE_OTHER): Admitting: Podiatry

## 2024-02-26 DIAGNOSIS — M79674 Pain in right toe(s): Secondary | ICD-10-CM | POA: Diagnosis not present

## 2024-02-26 DIAGNOSIS — M79675 Pain in left toe(s): Secondary | ICD-10-CM

## 2024-02-26 DIAGNOSIS — Q666 Other congenital valgus deformities of feet: Secondary | ICD-10-CM

## 2024-02-26 DIAGNOSIS — B351 Tinea unguium: Secondary | ICD-10-CM | POA: Diagnosis not present

## 2024-02-26 NOTE — Progress Notes (Unsigned)
  Subjective:  Patient ID: Anne Brewer, female    DOB: [birth year],  MRN: [id number] Chief Complaint  Patient presents with   Nail Problem    Patient is here for RFC, HX osteoarthritis and rheumatoid arthritis affecting her knees, contributing to joint pain and mobility issues. Concerned with bilateral hallux nails.     History of Present Illness Anne Brewer is a 53 year old female who presents with foot pain and toenail issues.  Her main concern today is her toenails particularly big toenails.  She was previously using ciclopirox  but she said that she lost the medication so she has discontinued this.  She said the nails become tender to the prolongated.  She does not report any drainage or pus or any open lesions.  She has been working on shoes, arch supports for the flatfoot, pain.  She is continue to try to do exercises.   Objective:    Physical Exam General: AAO x3, NAD  Dermatological: Nails are hypertrophic, dystrophic, brittle, discolored, elongated 10. No surrounding redness or drainage. Tenderness nails 1-5 bilaterally. No open lesions or pre-ulcerative lesions are identified today.   Vascular: Dorsalis Pedis artery and Posterior Tibial artery pedal pulses are 2/4 bilateral with immedate capillary fill time. There is no pain with calf compression, swelling, warmth, erythema.   Neruologic: Grossly intact via light touch bilateral.   Musculoskeletal: Symptom flatfoot is present.  Unable to appreciate any area of pinpoint tenderness today.  Flexor, extensor tendons intact.  MMT 5/5.     Assessment:   Pes planovalgus  Dermatophytosis of nail  Pain in toes of both feet   Plan:  Patient was evaluated and treated and all questions answered.  Assessment and Plan Assessment & Plan   Onychomycosis Onychomycosis likely secondary to trauma. Topical antifungal treatment required for several months. - As a courtesy I debrided the nails for any complications or  bleeding.  We discussed using formula 7 to see if this helps the nails but more.  Pes planovalgus -Continue shoes, arch supports.  She wants to look at getting new inserts after the first of the year.  Continue exercises to help strengthen lower extremity.  Return for nail trim, possible inserts.  Anne Brewer DPM

## 2024-02-27 ENCOUNTER — Other Ambulatory Visit: Payer: Self-pay

## 2024-03-03 ENCOUNTER — Encounter: Payer: Self-pay | Admitting: *Deleted

## 2024-03-03 ENCOUNTER — Other Ambulatory Visit (HOSPITAL_COMMUNITY): Payer: Self-pay

## 2024-03-03 ENCOUNTER — Other Ambulatory Visit: Payer: Self-pay

## 2024-03-03 NOTE — Congregational Nurse Program (Signed)
  Dept: [phone]   Congregational Nurse Program Note  Date of Encounter: 03/03/2024  Past Medical History: Past Medical History:  Diagnosis Date   Anemia    Arthritis     Encounter Details:  Community Questionnaire - 03/03/24 1305       Questionnaire   Ask client: Do you give verbal consent for me to treat you today? Yes    Student Assistance N/A    Location Patient Served  [facility]    Encounter Setting CN site;Phone/Text/Email    Population Status Unhoused    Insurance Medicare    Insurance/Financial Assistance Referral N/A    Medication Have Medication Insecurities;Provided Medication Assistance    Medical Provider Yes    Screening Referrals Made N/A    Medical Referrals Made N/A    Medical Appointment Completed N/A    CNP Interventions Advocate/Support    Screenings CN Performed N/A    ED Visit Averted N/A    Life-Saving Intervention Made N/A         Client seen outside [facility] and asked writer to check about her methotrexate  medication. Contacted [facility] pharmacy and medication will be delivered within next two days.Information given to client. Anne Brewer W RN CN

## 2024-03-08 ENCOUNTER — Other Ambulatory Visit (HOSPITAL_COMMUNITY): Payer: Self-pay

## 2024-03-08 ENCOUNTER — Other Ambulatory Visit: Payer: Self-pay

## 2024-03-08 MED ORDER — LIDOCAINE 5 % EX PTCH
1.0000 | MEDICATED_PATCH | Freq: Every day | CUTANEOUS | 2 refills | Status: DC
  Filled 2024-03-08: qty 30, 30d supply, fill #0

## 2024-03-08 MED ORDER — DULOXETINE HCL 60 MG PO CPEP
60.0000 mg | ORAL_CAPSULE | Freq: Every day | ORAL | 2 refills | Status: DC
  Filled 2024-03-08 (×2): qty 30, 30d supply, fill #0

## 2024-03-09 ENCOUNTER — Other Ambulatory Visit (HOSPITAL_COMMUNITY): Payer: Self-pay

## 2024-03-09 ENCOUNTER — Other Ambulatory Visit: Payer: Self-pay

## 2024-03-09 NOTE — Congregational Nurse Program (Signed)
  Dept: [phone]   Congregational Nurse Program Note  Date of Encounter: 03/09/2024  Called pharmacy after talking with resident to confirm when prescriptions would be ready.  Meds will be delivered tomorrow or Thursday morning. Past Medical History: Past Medical History:  Diagnosis Date   Anemia    Arthritis     Encounter Details:  Community Questionnaire - 03/09/24 1030       Questionnaire   Ask client: Do you give verbal consent for me to treat you today? Yes    Student Assistance [employer] Nurse    Location Patient Served  [facility]    Encounter Setting CN site;Phone/Text/Email    Population Status Unhoused    Insurance Medicare    Insurance/Financial Assistance Referral N/A    Medication Have Medication Insecurities;Provided Medication Assistance;Patient Medications Reviewed    Medical Provider Yes    Screening Referrals Made N/A    Medical Referrals Made N/A    Medical Appointment Completed N/A    CNP Interventions Advocate/Support;Counsel;Navigate Healthcare System;Spiritual Care    Screenings CN Performed N/A    ED Visit Averted N/A    Life-Saving Intervention Made N/A

## 2024-03-11 ENCOUNTER — Encounter: Payer: Self-pay | Admitting: Emergency Medicine

## 2024-03-11 NOTE — Progress Notes (Unsigned)
 Patient presents for refill of her salonpas - reports it helps her knees She was recently seen by her pain management specialist and now taking cymbalta 60mg  every day.  She plans to start doing PT at the [place].    She has no other acute complaints and is otherwise doing well.

## 2024-03-12 ENCOUNTER — Other Ambulatory Visit: Payer: Self-pay

## 2024-03-15 ENCOUNTER — Other Ambulatory Visit (HOSPITAL_COMMUNITY): Payer: Self-pay

## 2024-03-15 ENCOUNTER — Other Ambulatory Visit: Payer: Self-pay | Admitting: Family

## 2024-03-15 DIAGNOSIS — G894 Chronic pain syndrome: Secondary | ICD-10-CM

## 2024-03-15 DIAGNOSIS — M1990 Unspecified osteoarthritis, unspecified site: Secondary | ICD-10-CM

## 2024-03-15 MED ORDER — CELECOXIB 200 MG PO CAPS
200.0000 mg | ORAL_CAPSULE | Freq: Two times a day (BID) | ORAL | 2 refills | Status: DC
  Filled 2024-03-15: qty 60, 30d supply, fill #0

## 2024-03-15 NOTE — Telephone Encounter (Signed)
 Complete

## 2024-03-22 ENCOUNTER — Other Ambulatory Visit (HOSPITAL_COMMUNITY): Payer: Self-pay

## 2024-03-23 ENCOUNTER — Other Ambulatory Visit: Payer: Self-pay

## 2024-03-23 ENCOUNTER — Other Ambulatory Visit: Payer: Self-pay | Admitting: Physician Assistant

## 2024-03-23 ENCOUNTER — Other Ambulatory Visit (HOSPITAL_COMMUNITY): Payer: Self-pay

## 2024-03-23 ENCOUNTER — Encounter: Payer: Self-pay | Admitting: Critical Care Medicine

## 2024-03-23 MED ORDER — LIDOCAINE 5 % EX PTCH
1.0000 | MEDICATED_PATCH | Freq: Every day | CUTANEOUS | 0 refills | Status: AC
  Filled 2024-03-23: qty 60, 60d supply, fill #0

## 2024-03-23 MED ORDER — LIDOCAINE 5 % EX PTCH
2.0000 | MEDICATED_PATCH | Freq: Every day | CUTANEOUS | 0 refills | Status: DC
  Filled 2024-03-23: qty 60, 30d supply, fill #0

## 2024-03-23 MED ORDER — LIDOCAINE 5 % EX PTCH
2.0000 | MEDICATED_PATCH | Freq: Every day | CUTANEOUS | 0 refills | Status: DC
  Filled 2024-03-23 – 2024-05-03 (×3): qty 60, 30d supply, fill #0

## 2024-03-23 NOTE — Progress Notes (Signed)
 Pt out of lidocaine  patches, 5% patches sent in for her.   Shona Shad, PA-C 03/23/2024 11:31 AM

## 2024-03-23 NOTE — Progress Notes (Unsigned)
 The patient attended a screening event on 02/11/2024, where her blood pressure was measured at 137/85 mmHg, her non-fasting blood glucose was 103 mg/dl, and her J8R was 4.2%, placing her in the prediabetic range. During the event, the patient reported that she does not smoke, has Medicaid and Medicare for insurance, is established with a primary care provider (Dr. Lorren), and does have a housing and IPV SDOH need that were shared at the event. Transportation need was noted needing clarification at the event. Resources were provided to the pt.    A chart review indicates Amy JINNY Chute, NP - [facility] as her PCP.   The patients most recent office visit was on 10/24/2023. The pt has an upcoming appt on 10/25/2024 at 9:00 AM. Pt Pt has [employer] and Medicaid for her insurance. She has a housing and IPV SDOH need. Transportation need is not indicated per chart review. She is not a smoker. Pt is currently established with Congregational Nursing Program, she was seen by Madison Silvan, RN and Donny Miu, RN at [facility] after the screening event. Notes from both Nurses indicates that the pt is unhoused, she is staying in the [employer] at [employer], has insurance, PCP, has Medication support.   Call Attempt #1: [facility] called pt to discuss SDOH needs. CHW left vm for pt to return call.  Call Attempt #2: [facility] called pt again to discuss SDOH needs. Pt did not answer. CHW left vm for pt to return call.  Call Attempt #3: [facility] called pt for third call attempt to discuss SDOH needs status. Pt did not answer again and vm was left for her to return call.   [facility] sent in basket to Madison Silvan, RN and Donny Miu, RN about SDOH needs. Madison Silvan, RN informed CHW that the pt has a Case Manager at the [employer] and a Peer Support Person that helps with transportation. CHW sent letter with the [facility] homeless resources one pager in case needed by the pt.  An additional follow up  will be done in according to the health equity team's protocol.

## 2024-03-24 NOTE — Progress Notes (Signed)
 Pt given refill on lidocaine  5% patch
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
## 2024-03-30 NOTE — Congregational Nurse Program (Signed)
  Dept: [phone]   Congregational Nurse Program Note  Date of Encounter: 03/16/2024  Assisted resident with making MD appointments for complaints of hip and knee pain.  Has history of knee replacements both knees. Past Medical History: Past Medical History:  Diagnosis Date   Anemia    Arthritis     Encounter Details:  Community Questionnaire - 03/16/24 1200       Questionnaire   Ask client: Do you give verbal consent for me to treat you today? Yes    Student Assistance N/A    Location Patient Served  [facility]    Encounter Setting CN site    Population Status Unhoused    Insurance Medicare    Insurance/Financial Assistance Referral N/A    Medication Have Medication Insecurities    Medical Provider Yes    Screening Referrals Made N/A    Medical Referrals Made [facility] PCP/Clinic    Medical Appointment Completed N/A    CNP Interventions Advocate/Support;Counsel;Navigate Healthcare System;Case Management    Screenings CN Performed N/A    ED Visit Averted N/A    Life-Saving Intervention Made N/A

## 2024-03-30 NOTE — Congregational Nurse Program (Signed)
  Dept: [phone]   Congregational Nurse Program Note  Date of Encounter: 03/30/2024  Clinic visit for complaint of hip and back pain, needed assistance for obtaining records from the orthopedist to go to the pain clinic.  MD at pain clinic will decide on further treatment, verified the records had been received at [facility].  Resident will be called for the appointment.  Past Medical History: Past Medical History:  Diagnosis Date   Anemia    Arthritis     Encounter Details:  Community Questionnaire - 03/30/24 0948       Questionnaire   Ask client: Do you give verbal consent for me to treat you today? Yes    Student Assistance N/A    Location Patient Served  [facility]    Encounter Setting CN site    Population Status Unhoused    Insurance Medicare    Insurance/Financial Assistance Referral N/A    Medication Have Medication Insecurities;Patient Medications Reviewed    Medical Provider Yes    Screening Referrals Made N/A    Medical Referrals Made N/A    Medical Appointment Completed N/A    CNP Interventions Advocate/Support;Counsel;Navigate Healthcare System;Case Management    Screenings CN Performed N/A    ED Visit Averted N/A    Life-Saving Intervention Made N/A

## 2024-03-31 ENCOUNTER — Other Ambulatory Visit: Payer: Self-pay

## 2024-03-31 ENCOUNTER — Other Ambulatory Visit: Payer: Self-pay | Admitting: Physician Assistant

## 2024-03-31 ENCOUNTER — Encounter: Payer: Self-pay | Admitting: Physician Assistant

## 2024-03-31 ENCOUNTER — Other Ambulatory Visit: Payer: Self-pay | Admitting: Family Medicine

## 2024-03-31 MED ORDER — TRAZODONE HCL 100 MG PO TABS
100.0000 mg | ORAL_TABLET | Freq: Every evening | ORAL | 0 refills | Status: AC | PRN
  Filled 2024-03-31: qty 30, 30d supply, fill #0

## 2024-03-31 NOTE — Progress Notes (Cosign Needed)
 [employer]  S: 53 yo with hx gastric bypass, obesity, RA, chronic pain here asking for refills of methotrexate  and trazodone .  She was doubling up on previous trazodone  prescription.  We discussed not to double up on any medicine without speaking to her prescriber.    Also asking for refill of methotrexate .   O: There were no vitals filed for this visit. General: No acute distress. Lungs: unlabored Neurological: Alert and oriented 3. Moves all extremities 4. Cranial nerves II through XII grossly intact.. Extremities: No clubbing or cyanosis. No edema.  A/P:  Insomnia: trazodone  refilled, 100 mg rx.  Instructed not to double up on medications without having a conversation with her prescriber.  RA: + RF, + anti ccp.  She has follow up with rheum in January.  Most recent labs in June (would prefer labs within 3 months).  Will hold off on prescribing.  Will discuss.    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   L knee OA - needs ortho follow up   Meliton Monte, MD  -------------------------------------------- Pt says is out of methotrexate . Last fill was 10/29 for a 1 month supply. She has appt w/ Rheumatologist in January. Dr Monte refilled her Methotrexate  for 1 month. She will f/u with Rheumatologist for future refills.  She also says she is out of Trazodone , Last fill was 10/29 for 90 tabs. However, she has been taking 2 tabs per night instead of 1/2-1.   Dr Monte will write for Trazodone  100 mg and pt told strongly that she can only take 1 tab/night.   She c/o severe hip pain. 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  She is strongly encouraged to f/u with Ortho (needs to pay her bill first) to see what treatment options they recommend.   She is taking Bayer Back and Body, 2 tabs prn, but that ends up being twice a day.  She is also on Celebrex  200 mg every day.    She was asked to change to Tylenol  with the Celebrex  and limit that to 500 mg tabs up to 3 x day.    Shona Shad, PA-C 03/31/2024 3:48 PM

## 2024-03-31 NOTE — Progress Notes (Signed)
 error

## 2024-04-01 ENCOUNTER — Other Ambulatory Visit: Payer: Self-pay

## 2024-04-01 ENCOUNTER — Other Ambulatory Visit: Payer: Self-pay | Admitting: Critical Care Medicine

## 2024-04-01 ENCOUNTER — Encounter: Payer: Self-pay | Admitting: *Deleted

## 2024-04-01 NOTE — Congregational Nurse Program (Signed)
  Dept: [phone]   Congregational Nurse Program Note  Date of Encounter: 04/01/2024  Past Medical History: Past Medical History:  Diagnosis Date   Anemia    Arthritis     Encounter Details:  Community Questionnaire - 04/01/24 1437       Questionnaire   Ask client: Do you give verbal consent for me to treat you today? N/A    Student Assistance N/A    Location Patient Served  [facility]    Encounter Setting CN site    Population Status Unhoused    Insurance Medicare    Insurance/Financial Assistance Referral N/A    Medication Have Medication Insecurities;Provided Medication Assistance    Medical Provider Yes    Screening Referrals Made N/A    Medical Referrals Made N/A    Medical Appointment Completed N/A    CNP Interventions Advocate/Support;Case Management    Screenings CN Performed N/A    ED Visit Averted N/A    Life-Saving Intervention Made N/A         Picked up trazodone  from [facility] pharmacy per MD request and brought to [facility]. Writer could not locate client and left medication at [facility] front desk with staff including client's name and bed number. Mykaylah Ballman W RN CN

## 2024-04-02 ENCOUNTER — Other Ambulatory Visit: Payer: Self-pay

## 2024-04-02 MED ORDER — METHOTREXATE SODIUM 2.5 MG PO TABS
25.0000 mg | ORAL_TABLET | ORAL | 0 refills | Status: AC
  Filled 2024-04-02 – 2024-05-07 (×3): qty 40, 28d supply, fill #0

## 2024-04-07 ENCOUNTER — Encounter: Payer: Self-pay | Admitting: Physician Assistant
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
## 2024-04-08 ENCOUNTER — Other Ambulatory Visit: Payer: Self-pay

## 2024-04-08 MED ORDER — METHOTREXATE SODIUM 2.5 MG PO TABS
25.0000 mg | ORAL_TABLET | ORAL | 0 refills | Status: AC
  Filled 2024-04-08 – 2024-04-09 (×2): qty 40, 28d supply, fill #0

## 2024-04-08 MED ORDER — SULFASALAZINE 500 MG PO TABS
500.0000 mg | ORAL_TABLET | Freq: Two times a day (BID) | ORAL | 0 refills | Status: AC
  Filled 2024-04-09 (×2): qty 60, 30d supply, fill #0

## 2024-04-09 ENCOUNTER — Other Ambulatory Visit (HOSPITAL_COMMUNITY): Payer: Self-pay

## 2024-04-09 ENCOUNTER — Other Ambulatory Visit: Payer: Self-pay

## 2024-04-12 ENCOUNTER — Other Ambulatory Visit: Payer: Self-pay

## 2024-04-14 ENCOUNTER — Encounter: Payer: Self-pay | Admitting: Physician Assistant

## 2024-04-14 NOTE — Progress Notes (Signed)
 Pt c/o L wrist pain and swelling. Wonders if her RA is flaring up. Pt given Voltaren  cream. Given instructions on Tylenol , 500 mg x 2 tid.   Pt is going to call the financial counselor at the Ortho office. She has a balance there. Also was told she needs to lose 50 lbs before she can have surgery.   She is almost out of Cymbalta , there is a refill on it, she was told she can just call it in.   She is drinking large amounts of water, says up to 3 bottles at a time. She is on a fluid pill and gets thirsty.  She is having continence issues.  She is compliant w/ Lasix  20 mg every day.  She was advised to try to limit water to 4 bottles/day, 1 with meals and 1 for the rest of the day.   She also eats high-salt snacks, was encouraged to limit those.   Shona Shad, PA-C 04/14/2024 2:41 PM

## 2024-04-19 ENCOUNTER — Other Ambulatory Visit (HOSPITAL_COMMUNITY): Payer: Self-pay

## 2024-04-19 ENCOUNTER — Other Ambulatory Visit: Payer: Self-pay | Admitting: Physician Assistant

## 2024-04-19 ENCOUNTER — Other Ambulatory Visit: Payer: Self-pay

## 2024-04-19 ENCOUNTER — Other Ambulatory Visit: Payer: Self-pay | Admitting: Critical Care Medicine

## 2024-04-19 ENCOUNTER — Encounter: Payer: Self-pay | Admitting: *Deleted

## 2024-04-19 ENCOUNTER — Encounter: Payer: Self-pay | Admitting: Physician Assistant

## 2024-04-19 MED ORDER — PREGABALIN 50 MG PO CAPS
50.0000 mg | ORAL_CAPSULE | Freq: Three times a day (TID) | ORAL | 1 refills | Status: AC
  Filled 2024-04-19: qty 30, 10d supply, fill #0
  Filled 2024-04-19: qty 60, 20d supply, fill #0

## 2024-04-19 MED ORDER — DULOXETINE HCL 60 MG PO CPEP
60.0000 mg | ORAL_CAPSULE | Freq: Every day | ORAL | 2 refills | Status: AC
  Filled 2024-04-19 (×2): qty 30, 30d supply, fill #0

## 2024-04-19 NOTE — Progress Notes (Signed)
 See note

## 2024-04-19 NOTE — Progress Notes (Unsigned)
 Pt here to make sure she has meds for Christmas.  She is almost out of Cymbalta , a refill was sent in.   She reviewed her issues w/ Dr Brien.  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   She needs a hip and knee replacement, will have to lose at least 40

## 2024-04-19 NOTE — Congregational Nurse Program (Signed)
" °  Dept: [phone]   Congregational Nurse Program Note  Date of Encounter: 04/19/2024  Past Medical History: Past Medical History:  Diagnosis Date   Anemia    Arthritis     Encounter Details:  Community Questionnaire - 04/19/24 1444       Questionnaire   Ask client: Do you give verbal consent for me to treat you today? Yes    Student Assistance N/A    Location Patient Served  [facility]    Encounter Setting CN site    Population Status Unhoused    Insurance Medicare    Insurance/Financial Assistance Referral N/A    Medication Have Medication Insecurities;Provided Medication Assistance;Patient Medications Reviewed    Medical Provider Yes    Screening Referrals Made N/A    Medical Referrals Made N/A    Medical Appointment Completed [facility] PCP/Clinic    CNP Interventions Advocate/Support;Case Management    Screenings CN Performed N/A    ED Visit Averted N/A    Life-Saving Intervention Made N/A         Writer went to [facility] pharmacy to p/u medications per MD request. Picked up cymbalta  and pharmacy will not have Lyrica  until later afternoon. Gave cymbalta  to client and will request other CN pickup for tomorrow. Dashiell Franchino W RN CN   "

## 2024-04-20 ENCOUNTER — Other Ambulatory Visit: Payer: Self-pay

## 2024-04-20 ENCOUNTER — Other Ambulatory Visit (HOSPITAL_COMMUNITY): Payer: Self-pay

## 2024-04-26 ENCOUNTER — Other Ambulatory Visit: Payer: Self-pay

## 2024-04-26 ENCOUNTER — Other Ambulatory Visit: Payer: Self-pay | Admitting: Critical Care Medicine

## 2024-04-26 ENCOUNTER — Encounter: Payer: Self-pay | Admitting: *Deleted

## 2024-04-26 ENCOUNTER — Encounter: Payer: Self-pay | Admitting: Critical Care Medicine

## 2024-04-26 DIAGNOSIS — M1990 Unspecified osteoarthritis, unspecified site: Secondary | ICD-10-CM

## 2024-04-26 DIAGNOSIS — G894 Chronic pain syndrome: Secondary | ICD-10-CM

## 2024-04-26 MED ORDER — CELECOXIB 200 MG PO CAPS
200.0000 mg | ORAL_CAPSULE | Freq: Two times a day (BID) | ORAL | 2 refills | Status: AC
  Filled 2024-04-26 (×2): qty 60, 30d supply, fill #0
  Filled 2024-05-10: qty 60, 30d supply, fill #1

## 2024-04-26 NOTE — Congregational Nurse Program (Signed)
" °  Dept: [phone]   Congregational Nurse Program Note  Date of Encounter: 04/26/2024  Past Medical History: Past Medical History:  Diagnosis Date   Anemia    Arthritis     Encounter Details:  Community Questionnaire - 04/26/24 1556       Questionnaire   Ask client: Do you give verbal consent for me to treat you today? Yes    Student Assistance N/A    Location Patient Served  [facility]    Encounter Setting CN site    Population Status Unhoused    Insurance Medicare    Insurance/Financial Assistance Referral N/A    Medication Have Medication Insecurities;Provided Medication Assistance    Medical Provider Yes    Screening Referrals Made N/A    Medical Referrals Made N/A    Medical Appointment Completed N/A    CNP Interventions Advocate/Support;Case Management    Screenings CN Performed N/A    ED Visit Averted N/A    Life-Saving Intervention Made N/A        Writer picked up celebrex  at [facility] pharmacy per MD request and brought to [facility]. Could not locate client and left at [facility] front desk with client's name and bed number. Thurma Priego W RN CN    "

## 2024-04-27 NOTE — Progress Notes (Signed)
 The patient was seen today in the [place] [facility] clinic she needs refills on her Celebrex  we prescribed the Celebrex  for the patient and she has follow-up with orthopedics.  She wanted results of her x-ray that was done at the orthopedic practice for the pain management center.  I told the patient the pain management doctor should ask for those records directly

## 2024-05-03 ENCOUNTER — Encounter: Payer: Self-pay | Admitting: Critical Care Medicine

## 2024-05-03 ENCOUNTER — Other Ambulatory Visit: Payer: Self-pay

## 2024-05-03 ENCOUNTER — Other Ambulatory Visit (HOSPITAL_COMMUNITY): Payer: Self-pay

## 2024-05-03 MED ORDER — LIDOCAINE 5 % EX PTCH
2.0000 | MEDICATED_PATCH | Freq: Every day | CUTANEOUS | 1 refills | Status: AC
  Filled 2024-05-03 – 2024-05-07 (×5): qty 60, 30d supply, fill #0

## 2024-05-04 ENCOUNTER — Other Ambulatory Visit: Payer: Self-pay

## 2024-05-04 NOTE — Congregational Nurse Program (Signed)
" °  Dept: [phone]   Congregational Nurse Program Note  Date of Encounter: 04/27/2024  Clinic visit to determine in [facility] had results from her orthopedic visit in order to determine next treatment.  Called pain clinic with resident present but unable to find out if results were sent to them. Past Medical History: Past Medical History:  Diagnosis Date   Anemia    Arthritis     Encounter Details:  Community Questionnaire - 04/27/24 1155       Questionnaire   Ask client: Do you give verbal consent for me to treat you today? Yes    Student Assistance N/A    Location Patient Served  [facility]    Encounter Setting CN site    Population Status Unhoused    Insurance Medicare    Insurance/Financial Assistance Referral N/A    Medication Have Medication Insecurities;Patient Medications Reviewed    Medical Provider Yes    Medical Referrals Made [facility] PCP/Clinic    Medical Appointment Completed N/A    Screenings CN Performed (remember to also record results) N/A    CNP Interventions Advocate/Support;Case Management;Navigate Healthcare System;Counsel    ED Visit Averted N/A    RETIRED Screening Referrals Made N/A      Questionnaire   Life-Saving Intervention Made N/A            "

## 2024-05-04 NOTE — Congregational Nurse Program (Signed)
" °  Dept: [phone]   Congregational Nurse Program Note  Date of Encounter: 05/04/2024  Clinic visit to check blood pressure and to contact pain management clinic regarding treatment.  BP 132/82, pulse 60 and regular, O2 Sat 98%.  Left message for pain management clinic regarding next treatment. Past Medical History: Past Medical History:  Diagnosis Date   Anemia    Arthritis     Encounter Details:  Community Questionnaire - 05/04/24 0950       Questionnaire   Ask client: Do you give verbal consent for me to treat you today? Yes    Student Assistance N/A    Location Patient Served  [facility]    Encounter Setting CN site    Population Status Unhoused    Insurance Medicare    Insurance/Financial Assistance Referral N/A    Medication Have Medication Insecurities;Patient Medications Reviewed    Medical Provider Yes    Medical Referrals Made NA    Medical Appointment Completed Non-[facility] PCP/Clinic    Screenings CN Performed (remember to also record results) Blood Pressure    CNP Interventions Advocate/Support;Navigate Healthcare System;Case Management;Health Counseling    ED Visit Averted N/A      Questionnaire   Life-Saving Intervention Made N/A            "

## 2024-05-05 ENCOUNTER — Other Ambulatory Visit (HOSPITAL_COMMUNITY): Payer: Self-pay

## 2024-05-05 ENCOUNTER — Other Ambulatory Visit: Payer: Self-pay

## 2024-05-05 MED ORDER — PREDNISONE 10 MG PO TABS
10.0000 mg | ORAL_TABLET | Freq: Every day | ORAL | 0 refills | Status: AC
  Filled 2024-05-05: qty 7, 7d supply, fill #0

## 2024-05-05 MED ORDER — AZITHROMYCIN 250 MG PO TABS
ORAL_TABLET | ORAL | 0 refills | Status: AC
  Filled 2024-05-05: qty 6, 5d supply, fill #0

## 2024-05-05 MED ORDER — CETIRIZINE HCL 10 MG PO TABS
10.0000 mg | ORAL_TABLET | Freq: Every day | ORAL | 3 refills | Status: AC
  Filled 2024-05-05: qty 90, 90d supply, fill #0

## 2024-05-05 MED ORDER — FUROSEMIDE 20 MG PO TABS
20.0000 mg | ORAL_TABLET | Freq: Every day | ORAL | 3 refills | Status: AC | PRN
  Filled 2024-05-05: qty 90, 90d supply, fill #0

## 2024-05-06 ENCOUNTER — Encounter: Payer: Self-pay | Admitting: *Deleted

## 2024-05-06 ENCOUNTER — Other Ambulatory Visit: Payer: Self-pay

## 2024-05-06 ENCOUNTER — Other Ambulatory Visit (HOSPITAL_COMMUNITY): Payer: Self-pay

## 2024-05-06 NOTE — Congregational Nurse Program (Signed)
" °  Dept: [phone]   Congregational Nurse Program Note  Date of Encounter: 05/06/2024  Past Medical History: Past Medical History:  Diagnosis Date   Anemia    Arthritis     Encounter Details:  Community Questionnaire - 05/06/24 1224       Questionnaire   Ask client: Do you give verbal consent for me to treat you today? Yes    Student Assistance N/A    Location Patient Served  [facility]    Encounter Setting CN site;Phone/Text/Email    Population Status Unhoused    Insurance [employer]    Insurance/Financial Assistance Referral N/A    Medication Have Medication Insecurities;CN Made Medication Intervention (pickup etc)    Medical Provider Yes    Medical Referrals Made NA    Medical Appointment Completed N/A    Screenings CN Performed (remember to also record results) NA    CNP Interventions Advocate/Support;Case Management    ED Visit Averted N/A      Questionnaire   Life-Saving Intervention Made N/A        Went to [facility] pharmacy to p/u lidocaine  prescription patches prescribed by MD. Pharmacy is waiting on prior authorization to complete. Contacted Dr Brien and was instructed to give OTC box of patches while waiting on authorization to complete. Picked up client's other medications from [facility] pharmacy that was ordered yesterday and brought to client at [facility]. Patches and medication was given to client. Jawaun Celmer W RN CN    "

## 2024-05-07 ENCOUNTER — Other Ambulatory Visit (HOSPITAL_COMMUNITY): Payer: Self-pay

## 2024-05-07 ENCOUNTER — Other Ambulatory Visit: Payer: Self-pay

## 2024-05-07 ENCOUNTER — Telehealth: Payer: Self-pay

## 2024-05-07 NOTE — Telephone Encounter (Signed)
 Pharmacy Patient Advocate Encounter  Received notification from [facility] that Prior Authorization for LIDOCAINE  5% PATCH has been APPROVED from 05/06/2024 to 04/28/2025   PA #/Case ID/Reference #: [id number]

## 2024-05-10 ENCOUNTER — Other Ambulatory Visit (HOSPITAL_COMMUNITY): Payer: Self-pay

## 2024-05-10 ENCOUNTER — Encounter: Payer: Self-pay | Admitting: *Deleted

## 2024-05-10 NOTE — Congregational Nurse Program (Signed)
" °  Dept: [phone]   Congregational Nurse Program Note  Date of Encounter: 05/10/2024  Past Medical History: Past Medical History:  Diagnosis Date   Anemia    Arthritis     Encounter Details:  Community Questionnaire - 05/10/24 0919       Questionnaire   Ask client: Do you give verbal consent for me to treat you today? Yes    Student Assistance N/A    Location Patient Served  [facility]    Encounter Setting Phone/Text/Email    Population Status Unhoused    Insurance Medicaid    Insurance/Financial Assistance Referral N/A    Medication Have Medication Insecurities;CN Made Medication Intervention (pickup etc)    Medical Provider Yes    Medical Referrals Made NA    Medical Appointment Completed N/A    Screenings CN Performed (remember to also record results) NA    CNP Interventions Advocate/Support;Case Management    ED Visit Averted N/A      Questionnaire   Life-Saving Intervention Made N/A         Client in lobby of [facility] requesting assistance with medication. Client reports peer support person is picking her up in an hour and she can get her medication but concerned it is not ready. Writer contacted [facility]. Two of her medications are ready and another with be ready by 1000. Relayed information to client. Tanairy Payeur W RN CN   "

## 2024-05-11 ENCOUNTER — Encounter

## 2024-05-11 NOTE — Progress Notes (Unsigned)
 "  Office Visit Note  Patient: Anne Brewer             Date of Birth: 09-Jun-1970           MRN: [id number]             PCP: Jaycee Greig PARAS, NP Referring: Theotis Haze ORN, NP Visit Date: 05/11/2024 Occupation: Data Unavailable  Subjective:  No chief complaint on file.   History of Present Illness: Anne Brewer is a 54 y.o. female ***     Activities of Daily Living:  Patient reports morning stiffness for *** {minute/hour:19697}.   Patient {ACTIONS;DENIES/REPORTS:21021675::Denies} nocturnal pain.  Difficulty dressing/grooming: {ACTIONS;DENIES/REPORTS:21021675::Denies} Difficulty climbing stairs: {ACTIONS;DENIES/REPORTS:21021675::Denies} Difficulty getting out of chair: {ACTIONS;DENIES/REPORTS:21021675::Denies} Difficulty using hands for taps, buttons, cutlery, and/or writing: {ACTIONS;DENIES/REPORTS:21021675::Denies}  No Rheumatology ROS completed.   PMFS History:  Patient Active Problem List   Diagnosis Date Noted   Unspecified mood (affective) disorder 09/02/2023   Cannabis abuse 09/02/2023   Abnormal uterine bleeding (AUB) 09/11/2016   Uterine fibroid 09/11/2016   S/P total knee replacement 07/12/2015    Past Medical History:  Diagnosis Date   Anemia    Arthritis     Family History  Problem Relation Age of Onset   BRCA 1/2 Neg Hx    Breast cancer Neg Hx    Past Surgical History:  Procedure Laterality Date   ABDOMINAL SURGERY     CESAREAN SECTION     CHOLECYSTECTOMY     LAPAROSCOPIC GASTRIC SLEEVE RESECTION  2010   TOTAL KNEE ARTHROPLASTY Right 07/12/2015   Procedure: TOTAL KNEE ARTHROPLASTY;  Surgeon: Toribio JULIANNA [name], MD;  Location: [facility] OR;  Service: Orthopedics;  Laterality: Right;   Social History[1] Social History   Social History Narrative   Not on file     Immunization History  Administered Date(s) Administered   Influenza, Seasonal, Injecte, Preservative Fre 02/04/2024     Objective: Vital Signs: LMP 10/06/2016    Physical  Exam   Musculoskeletal Exam: ***  CDAI Exam: CDAI Score: -- Patient Global: --; Provider Global: -- Swollen: --; Tender: -- Joint Exam 05/11/2024   No joint exam has been documented for this visit   There is currently no information documented on the homunculus. Go to the Rheumatology activity and complete the homunculus joint exam.  Investigation: No additional findings.  Imaging: No results found.  Recent Labs: Lab Results  Component Value Date   WBC 7.7 10/24/2023   HGB 11.1 10/24/2023   PLT 259 10/24/2023   NA 141 10/24/2023   K 4.3 10/24/2023   CL 106 10/24/2023   CO2 20 10/24/2023   GLUCOSE 66 (L) 10/24/2023   BUN 18 10/24/2023   CREATININE 0.83 10/24/2023   BILITOT <0.2 10/24/2023   ALKPHOS 133 (H) 10/24/2023   AST 19 10/24/2023   ALT 15 10/24/2023   PROT 5.9 (L) 10/24/2023   ALBUMIN 3.5 (L) 10/24/2023   CALCIUM 8.8 10/24/2023   GFRAA >60 01/07/2017    Speciality Comments: No specialty comments available.  Procedures:  No procedures performed Allergies: Shellfish allergy, Lactose intolerance (gi), and Tomato   Assessment / Plan:     Visit Diagnoses: No diagnosis found.  Orders: No orders of the defined types were placed in this encounter.  No orders of the defined types were placed in this encounter.   Face-to-face time spent with patient was *** minutes. Greater than 50% of time was spent in counseling and coordination of care.  Follow-Up Instructions: No follow-ups on  file.   Alfonso Patterson, LPN  Note - This record has been created using Dragon software.  Chart creation errors have been sought, but may not always  have been located. Such creation errors do not reflect on  the standard of medical care.    [1]  Social History Tobacco Use   Smoking status: Every Day    Current packs/day: 0.25    Average packs/day: 0.3 packs/day for 2.0 years (0.5 ttl pk-yrs)    Types: Cigarettes   Smokeless tobacco: Never  Vaping Use   Vaping  status: Never Used  Substance Use Topics   Alcohol use: Yes    Comment: occasionally   Drug use: No   "

## 2024-05-12 ENCOUNTER — Encounter: Payer: Self-pay | Admitting: Physician Assistant

## 2024-05-12 ENCOUNTER — Encounter: Payer: Self-pay | Admitting: *Deleted

## 2024-05-12 NOTE — Congregational Nurse Program (Signed)
" °  Dept: [phone]   Congregational Nurse Program Note  Date of Encounter: 05/12/2024  Past Medical History: Past Medical History:  Diagnosis Date   Anemia    Arthritis     Encounter Details:  Community Questionnaire - 05/12/24 1220       Questionnaire   Ask client: Do you give verbal consent for me to treat you today? Yes    Student Assistance N/A    Location Patient Served  [facility]    Encounter Setting CN site    Population Status Unhoused    Insurance Medicare    Insurance/Financial Assistance Referral N/A    Medication N/A    Medical Provider Yes    Medical Referrals Made [facility] PCP/Clinic    Medical Appointment Completed N/A    Screenings CN Performed (remember to also record results) Blood Pressure    CNP Interventions Advocate/Support    ED Visit Averted N/A      Questionnaire   Life-Saving Intervention Made N/A         Client requested to see MD to discuss test results and pain. Completed triage form to see Dr Brien in [facility] clinic. Blood pressure (!) 144/84, pulse 71, last menstrual period 10/06/2016, SpO2 98%. Offered support and encouragement.  Danijah Noh W RN CN   "

## 2024-05-12 NOTE — Progress Notes (Signed)
 Pt has pain patches, wants to know the results of her Pain Clinic assessment of her Xrays, taken   Anne Hocking, NP is her new PCP at the [facility].  She has seen Dr Catherene at [facility] for pain management. She signed a form to get the [facility] hip Xray sent to him.   Dr Brien called Dr Catherene but they do not have the Xrays.   Dr Brien called [facility], [facility] to get the hip Xray. There was one from March. It will be faxed to his office. It can go to the pain clinic and her PCP.   No other issues or concerns  Anne Shad, PA-C 05/12/2024 2:32 PM

## 2024-05-13 NOTE — Progress Notes (Signed)
 Per initial f/u interval, The patient attended a screening event on 02/11/2024, where her blood pressure was measured at 137/85 mmHg, her non-fasting blood glucose was 103 mg/dl, and her J8R was 4.2%, placing her in the prediabetic range. During the event, the patient reported that she does not smoke, has Medicaid and Medicare for insurance, is established with a primary care provider (Dr. Lorren), and does have a housing and IPV SDOH need that were shared at the event. Transportation need was noted needing clarification at the event. Resources were provided to the pt.    A chart review indicates Anne JINNY Chute, NP - [facility] as her PCP.  The patients most recent office visit was on 10/24/2023. The pt has an upcoming appt on 10/25/2024 at 9:00 AM. Pt Pt has [employer] and Medicaid for her insurance. She has a housing and IPV SDOH need. Transportation need is not indicated per chart review. She is not a smoker. Pt is currently established with Congregational Nursing Program, she was seen by Madison Silvan, RN and Donny Miu, RN at [facility] after the screening event. Notes from both Nurses indicates that the pt is unhoused, she is staying in the [employer] at [employer], has insurance, PCP, has Medication support.    Call Attempt #1: CHW called pt to discuss SDOH needs. CHW left vm for pt to return call.   Call Attempt #2: CHW called pt again to discuss SDOH needs. Pt did not answer. CHW left vm for pt to return call.   Call Attempt #3: CHW called pt for third call attempt to discuss SDOH needs status. Pt did not answer again and vm was left for her to return call.    CHW sent in basket to Madison Silvan, RN and Donny Miu, RN about SDOH needs. Madison Silvan, RN informed CHW that the pt has a Case Manager at the [employer] and a Peer Support Person that helps with transportation. CHW sent letter with the [facility] homeless resources one pager in case needed by the  pt.  An additional follow up will be done in according to the health equity team's protocol.  During the 60 day f/u interval, the pt still has her upcoming appt indicated with Anne Chute, NP as previous PCP indicated in initial f/u interval. Chart review shows she has a new PCP Anne L. Venson, NP with [facility]) as of 05/12/2024 per her visit notes with Anne Shad, PA-C for a Cardiology visit. The pt is still being supported by Congregational Nurse Program at [facility]. The pt has [employer] Medicare and [employer] as her updated insurance. Chart review further indicates she still has a housing and IPV SDOH need indicated.   60 Day Call Attempt: CHW called pt to obtain SDOH needs status. CHW could not leave a vm due to inbox being full.   CHW reached out to Congregational Nurse, Madison Silvan, RN about pt's SDOH needs. She shared that the pt is supported by a SW at [facility] for her SDOH needs and has two pending housing applications.   At this time, no additional support from the Health Equity Team is indicated.

## 2024-05-14 ENCOUNTER — Other Ambulatory Visit (HOSPITAL_COMMUNITY): Payer: Self-pay

## 2024-05-17 ENCOUNTER — Other Ambulatory Visit: Payer: Self-pay

## 2024-05-18 NOTE — Congregational Nurse Program (Signed)
" °  Dept: [phone]   Congregational Nurse Program Note  Date of Encounter: 05/18/2024  Clinic visit for complaint of sore throat and coughing after completing an antibiotic.  Resident has [employer] as primary care, called [employer] to confirm that resident has an appointment with PCP tomorrow. With transportation scheduled.  Reported continued respiratory symptoms to PCP office.  Resident temperature 100 degrees, orally with non-productive cough noted however she reports coughing up thick sputum earlier today.   BP 120/84, pulse 77, O2 Sat 94% Past Medical History: Past Medical History:  Diagnosis Date   Anemia    Arthritis     Encounter Details:  Community Questionnaire - 05/18/24 1119       Questionnaire   Ask client: Do you give verbal consent for me to treat you today? Yes    Student Assistance N/A    Location Patient Served  [facility]    Encounter Setting CN site    Population Status Unhoused    Insurance Medicare    Insurance/Financial Assistance Referral N/A    Medication N/A    Medical Provider Yes    Medical Referrals Made [facility] PCP/Clinic    Medical Appointment Completed [facility] PCP/Clinic    Screenings CN Performed (remember to also record results) Blood Pressure    CNP Interventions Advocate/Support;Health Counseling;Navigate Healthcare System    ED Visit Averted N/A      Questionnaire   Life-Saving Intervention Made N/A            "

## 2024-05-19 ENCOUNTER — Other Ambulatory Visit (HOSPITAL_COMMUNITY): Payer: Self-pay

## 2024-05-19 MED ORDER — SUPPORT COMPRESSION SOCK MENS MISC: 1 refills | Status: AC

## 2024-05-19 MED ORDER — CALCIUM CITRATE 250 MG PO TABS: 1.0000 | ORAL_TABLET | Freq: Every day | ORAL | 3 refills | Status: AC

## 2024-05-19 MED ORDER — ACETAMINOPHEN ER 650 MG PO TBCR
650.0000 mg | EXTENDED_RELEASE_TABLET | Freq: Three times a day (TID) | ORAL | 1 refills | Status: AC | PRN
  Filled 2024-05-19: qty 300, 100d supply, fill #0

## 2024-05-19 MED ORDER — AMOXICILLIN 500 MG PO CAPS
500.0000 mg | ORAL_CAPSULE | Freq: Two times a day (BID) | ORAL | 0 refills | Status: AC
  Filled 2024-05-19: qty 14, 7d supply, fill #0

## 2024-05-19 MED ORDER — PREDNISONE 20 MG PO TABS
20.0000 mg | ORAL_TABLET | Freq: Every day | ORAL | 0 refills | Status: DC
  Filled 2024-05-19: qty 7, 7d supply, fill #0

## 2024-05-19 MED ORDER — DULOXETINE HCL 60 MG PO CPEP
60.0000 mg | ORAL_CAPSULE | Freq: Every day | ORAL | 3 refills | Status: AC
  Filled 2024-05-19: qty 90, 90d supply, fill #0

## 2024-05-19 MED ORDER — PROMETHAZINE-DM 6.25-15 MG/5ML PO SYRP
5.0000 mL | ORAL_SOLUTION | Freq: Four times a day (QID) | ORAL | 0 refills | Status: DC | PRN
  Filled 2024-05-19: qty 200, 10d supply, fill #0

## 2024-05-19 MED ORDER — FERROUS SULFATE 325 (65 FE) MG PO TABS
325.0000 mg | ORAL_TABLET | Freq: Every day | ORAL | 3 refills | Status: AC
  Filled 2024-05-19: qty 90, 90d supply, fill #0

## 2024-05-20 ENCOUNTER — Other Ambulatory Visit (HOSPITAL_COMMUNITY): Payer: Self-pay

## 2024-05-26 ENCOUNTER — Encounter: Payer: Self-pay | Admitting: *Deleted

## 2024-05-26 ENCOUNTER — Encounter: Payer: Self-pay | Admitting: Physician Assistant

## 2024-05-26 NOTE — Progress Notes (Signed)
 Dx w/ flu last Weds by PCP. Was in quarantine in the [place] until 01/27.  Today, she is feeling much better.   Still coughing clear mucus, has taken prednisone , amoxicillin  and generic Tussionex cough syrup. Was given Mucinex.  Throat still hoarse.  Drinking plenty of fluid.  Still w/ body aches, has Tylenol .  Dr Brien has sent her records to the Pain clinic and to Candis Hocking, NP, her PCP at [facility] on [facility].  She is going to see another place soon. Was advised to make sure it is a ground floor unit.   Shona Shad, PA-C 05/26/2024 1:45 PM

## 2024-05-26 NOTE — Congregational Nurse Program (Signed)
" °  Dept: [phone]   Congregational Nurse Program Note  Date of Encounter: 05/26/2024  Past Medical History: Past Medical History:  Diagnosis Date   Anemia    Arthritis     Encounter Details:  Community Questionnaire - 05/26/24 1024       Questionnaire   Ask client: Do you give verbal consent for me to treat you today? Yes    Student Assistance N/A    Location Patient Served  [facility]    Encounter Setting CN site    Population Status Unhoused    Insurance Medicare    Insurance/Financial Assistance Referral N/A    Medication N/A    Medical Provider Yes    Medical Referrals Made [facility] PCP[facility]inic    Medical Appointment Completed N/A    Screenings CN Performed (remember to also record results) Blood Pressure    CN Interventions Advocate/Support;Navigate Healthcare System    ED Visit Averted N/A      Questionnaire   Life-Saving Intervention Made N/A         Client signed paper to see MD in [facility] clinic. Completed triage form to see MD. Client recently changed PCP to [facility] on [facility]. Client reports being diagnosed with flu one week ago. Today, she c/o coughing clear mucus and throat feels hoarse. Blood pressure (!) 149/85, pulse (!) 59, temperature 98.6 F (37 C), temperature source Oral, last menstrual period 10/06/2016, SpO2 95%.  Melek Pownall W RN CN   "

## 2024-06-01 ENCOUNTER — Ambulatory Visit (INDEPENDENT_AMBULATORY_CARE_PROVIDER_SITE_OTHER): Admitting: Podiatry

## 2024-06-01 ENCOUNTER — Other Ambulatory Visit: Payer: Self-pay

## 2024-06-01 ENCOUNTER — Other Ambulatory Visit (HOSPITAL_COMMUNITY): Payer: Self-pay

## 2024-06-01 DIAGNOSIS — M79674 Pain in right toe(s): Secondary | ICD-10-CM | POA: Diagnosis not present

## 2024-06-01 DIAGNOSIS — B351 Tinea unguium: Secondary | ICD-10-CM

## 2024-06-01 DIAGNOSIS — M79675 Pain in left toe(s): Secondary | ICD-10-CM | POA: Diagnosis not present

## 2024-06-01 MED ORDER — PREDNISONE 20 MG PO TABS
20.0000 mg | ORAL_TABLET | Freq: Every day | ORAL | 0 refills | Status: AC
  Filled 2024-06-01: qty 5, 5d supply, fill #0

## 2024-06-01 MED ORDER — PROMETHAZINE-DM 6.25-15 MG/5ML PO SYRP
5.0000 mL | ORAL_SOLUTION | Freq: Four times a day (QID) | ORAL | 0 refills | Status: AC | PRN
  Filled 2024-06-01: qty 200, 10d supply, fill #0

## 2024-06-01 MED ORDER — PREGABALIN 50 MG PO CAPS
50.0000 mg | ORAL_CAPSULE | Freq: Three times a day (TID) | ORAL | 3 refills | Status: AC
  Filled 2024-06-01: qty 270, 90d supply, fill #0

## 2024-06-01 MED ORDER — DULOXETINE HCL 60 MG PO CPEP
60.0000 mg | ORAL_CAPSULE | Freq: Every day | ORAL | 3 refills | Status: AC
  Filled 2024-06-01: qty 90, 90d supply, fill #0

## 2024-06-01 NOTE — Progress Notes (Signed)
"  °  Subjective:   Chief Complaint  Patient presents with   RFC     Non diabetic patient presents to office tody for RFC and nail trim     History of Present Illness Anne Brewer is a 54 year old female who presents with concerns of thick, elongated nails that she is not able to trim herself.  She had acrylic on her toenails and she has not been using the topical antifungal.  Otherwise from foot standpoint she states that she has been doing well.  No swelling redness or drainage.  No open lesions.   Objective:    Physical Exam General: AAO x3, NAD  Dermatological: Nails are hypertrophic, dystrophic, brittle, discolored, elongated 10. No surrounding redness or drainage. Tenderness nails 1-5 bilaterally. The nails on the hallux are loose and only attached on the proximal nail fold. No open lesions or signs of infection to the nail sites. No open lesions or pre-ulcerative lesions are identified today bilaterally.   Vascular: Dorsalis Pedis artery and Posterior Tibial artery pedal pulses are 2/4 bilateral with immedate capillary fill time. There is no pain with calf compression, swelling, warmth, erythema.   Neruologic: Grossly intact via light touch bilateral.   Musculoskeletal: Flatfoot is present. No tenderness associated with this today. Flexor, extensor tendons intact.  MMT 5/5.     Assessment:    Dermatophytosis of nail  Pain in toes of both feet   Plan:  Patient was evaluated and treated and all questions answered.  Assessment and Plan Assessment & Plan   Symptomatic onychomycosis - Sharply debrided nails x 10 without complications or bleeding.  In particular was able to debride the hallux nails to remove the loose toenail.  Recommend continue with topical antifungal (has formula 7)  Return in about 3 months (around 08/29/2024).  Anne Brewer DPM  "

## 2024-06-03 ENCOUNTER — Other Ambulatory Visit: Payer: Self-pay

## 2024-08-30 ENCOUNTER — Ambulatory Visit: Admitting: Podiatry

## 2024-10-25 ENCOUNTER — Encounter: Admitting: Family
